# Patient Record
Sex: Female | Born: 1955 | Race: White | Hispanic: No | Marital: Married | Smoking: Never smoker
Health system: Southern US, Community
[De-identification: ages and names within clinical notes are randomized; demographics above are authoritative.]

## PROBLEM LIST (undated history)

## (undated) ENCOUNTER — Emergency Department (HOSPITAL_BASED_OUTPATIENT_CLINIC_OR_DEPARTMENT_OTHER): Admission: EM | Payer: PPO | Source: Home / Self Care

## (undated) DIAGNOSIS — C50919 Malignant neoplasm of unspecified site of unspecified female breast: Secondary | ICD-10-CM

## (undated) DIAGNOSIS — Z803 Family history of malignant neoplasm of breast: Secondary | ICD-10-CM

## (undated) DIAGNOSIS — S92001A Unspecified fracture of right calcaneus, initial encounter for closed fracture: Secondary | ICD-10-CM

## (undated) DIAGNOSIS — C4490 Unspecified malignant neoplasm of skin, unspecified: Secondary | ICD-10-CM

## (undated) DIAGNOSIS — M25519 Pain in unspecified shoulder: Secondary | ICD-10-CM

## (undated) DIAGNOSIS — M1990 Unspecified osteoarthritis, unspecified site: Secondary | ICD-10-CM

## (undated) DIAGNOSIS — M25569 Pain in unspecified knee: Secondary | ICD-10-CM

## (undated) HISTORY — DX: Unspecified osteoarthritis, unspecified site: M19.90

## (undated) HISTORY — PX: COLONOSCOPY: SHX174

## (undated) HISTORY — DX: Family history of malignant neoplasm of breast: Z80.3

## (undated) HISTORY — DX: Pain in unspecified knee: M25.569

## (undated) HISTORY — DX: Unspecified malignant neoplasm of skin, unspecified: C44.90

## (undated) HISTORY — DX: Malignant neoplasm of unspecified site of unspecified female breast: C50.919

## (undated) HISTORY — DX: Pain in unspecified shoulder: M25.519

## (undated) HISTORY — PX: FRACTURE SURGERY: SHX138

---

## 2001-06-25 ENCOUNTER — Emergency Department (HOSPITAL_COMMUNITY): Admission: EM | Admit: 2001-06-25 | Discharge: 2001-06-25 | Payer: Self-pay | Admitting: Emergency Medicine

## 2010-04-24 ENCOUNTER — Ambulatory Visit (HOSPITAL_COMMUNITY): Payer: Self-pay | Admitting: Psychology

## 2010-05-02 ENCOUNTER — Ambulatory Visit (HOSPITAL_COMMUNITY)
Admission: RE | Admit: 2010-05-02 | Discharge: 2010-05-02 | Payer: Self-pay | Source: Home / Self Care | Attending: Psychology | Admitting: Psychology

## 2010-05-11 ENCOUNTER — Ambulatory Visit
Admission: RE | Admit: 2010-05-11 | Discharge: 2010-05-11 | Payer: Self-pay | Source: Home / Self Care | Attending: Family Medicine | Admitting: Family Medicine

## 2010-05-11 ENCOUNTER — Encounter: Payer: Self-pay | Admitting: Family Medicine

## 2010-05-11 DIAGNOSIS — N644 Mastodynia: Secondary | ICD-10-CM | POA: Insufficient documentation

## 2010-05-17 ENCOUNTER — Telehealth: Payer: Self-pay | Admitting: Family Medicine

## 2010-05-21 ENCOUNTER — Ambulatory Visit (HOSPITAL_COMMUNITY)
Admission: RE | Admit: 2010-05-21 | Discharge: 2010-05-21 | Payer: Self-pay | Source: Home / Self Care | Attending: Psychology | Admitting: Psychology

## 2010-05-21 ENCOUNTER — Encounter: Payer: Self-pay | Admitting: Family Medicine

## 2010-05-21 LAB — HM MAMMOGRAPHY: HM Mammogram: NORMAL

## 2010-05-28 ENCOUNTER — Encounter: Payer: Self-pay | Admitting: Family Medicine

## 2010-05-31 NOTE — Assessment & Plan Note (Signed)
Summary: NOV: Left breast Pain   Vital Signs:  Patient profile:   55 year old female Height:      66 inches Weight:      188 pounds Pulse rate:   72 / minute BP sitting:   111 / 74  (right arm) Cuff size:   regular  Vitals Entered By: Avon Gully CMA, (AAMA) (May 11, 2010 10:40 AM) CC: Np est care,pain in left breast x 2 weeks   CC:  Np est care and pain in left breast x 2 weeks.  History of Present Illness: About 2.5 weeks ago noticed some brest discomforts. Feels like mastitis.  Hasn't felt any knots.  Last mammo in 2 years done at the [facility] in [place].  No redness or rash.  Pain is intermittant. Can last for hours. This AM not really hurting. Thought initally it was from working out. Has been on diclofenac for her right knee pain. No prior abnromal breast history.    Has had swelling in her right leg and knee for several months. Says has had a normal xray and [place] to rule out DVT. Has a brother with hx of DVT.  She has scheduled an appt with a vascular specialist.  She Says it will get tender and warm to touch. They think she may have gout and thus is on diclofenac for pain relief.  Though her uric acid level was 5.4 per the patient.  Had blood work on 11/2010  Habits & Providers  Alcohol-Tobacco-Diet     Alcohol drinks/day: 3     Tobacco Status: never  Exercise-Depression-Behavior     Does Patient Exercise: yes     Type of exercise: cardio     Exercise (avg: min/session): 30-60     Times/week: 4     STD Risk: never     Drug Use: no     Seat Belt Use: always  Current Medications (verified): 1)  Diclofenac Potassium 50 Mg Tabs (Diclofenac Potassium) .... Take One Tablet By Mouth Tree Times A Day  Allergies (verified): No Known Drug Allergies  Comments:  Nurse/Medical Assistant: The patient's medications and allergies were reviewed with the patient and were updated in the Medication and Allergy Lists. 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  HS degree. Married to [place] with 2 kids.   Works out regularly with spin classxes.  Never Smoked Alcohol use-yes Drug use-no Regular exercise-yes 2-3 caffeinated drinks per day.  Smoking Status:  never Does Patient Exercise:  yes STD Risk:  never Drug Use:  no Seat Belt Use:  always  Review of Systems       No fever/sweats/weakness, unexplained weight loss/gain.  No vison changes.  No difficulty hearing/ringing in ears, hay fever/allergies.  No chest pain/discomfort, palpitations.  No Br lump/nipple discharge.  No cough/wheeze.  No blood in BM, nausea/vomiting/diarrhea.  No nighttime urination, leaking urine, unusual vaginal bleeding, discharge (penis or vagina).  No muscle/joint pain. No rash, change in mole.  No HA, memory loss.  No anxiety, sleep d/o, depression.  No easy bruising/bleeding, unexplained lump   Physical Exam  General:  Well-developed,well-nourished,in no acute distress; alert,appropriate and cooperative throughout examination Breasts:  No mass, nodules, thickening, tenderness, bulging, retraction, inflamation, nipple discharge or skin changes noted.  Lungs:  Normal respiratory effort, chest expands symmetrically. Lungs are clear to auscultation, no crackles or wheezes. Heart:  Normal rate and regular rhythm. S1 and S2 normal without gallop, murmur, click, rub or other extra sounds.   Impression & Recommendations:  Problem # 1:  BREAST PAIN, LEFT (ICD-611.71) Unclear etiology. She is over due for mammogram so will set her up for Diagnostic mammo in [place] If neg consider making sure bras are fitting well nad getting enough support during her workouts.  Orders: T-Mammography, Diagnostic (bilateral) (16109)  Complete Medication List: 1)  Diclofenac  Potassium 50 Mg Tabs (Diclofenac potassium) .... Take one tablet by mouth tree times a day  Patient Instructions: 1)  We will call you with the breast clnic referral.    Orders Added: 1)  T-Mammography, Diagnostic (bilateral) [77056] 2)  New Patient Level III [60454]

## 2010-05-31 NOTE — Progress Notes (Addendum)
Summary: Mammo order  Phone Note From Other Clinic   Caller: Breast Clinic Reason for Call: Need Referral Information Summary of Call: Breast Clinic would like "ultrasound if abnormal" added to order for diag mammo on Monday. [phone], [phone] Initial call taken by: Francee Piccolo CMA Duncan Dull),  May 17, 2010 5:00 PM     Appended Document: Mammo order   Preventive Care Screening  Mammogram:    Date:  05/21/2010    Next Due:  11/2010    Results:  normal

## 2010-06-04 ENCOUNTER — Encounter (INDEPENDENT_AMBULATORY_CARE_PROVIDER_SITE_OTHER): Payer: Commercial Managed Care - PPO | Admitting: Psychology

## 2010-06-04 DIAGNOSIS — F4320 Adjustment disorder, unspecified: Secondary | ICD-10-CM

## 2010-06-14 NOTE — Consult Note (Signed)
Summary: [facility]  [facility]   Imported By: Maryln Gottron 06/06/2010 14:23:51  _____________________________________________________________________  External Attachment:    Type:   Image     Comment:   External Document

## 2010-06-26 ENCOUNTER — Telehealth: Payer: Self-pay | Admitting: Family Medicine

## 2010-06-26 DIAGNOSIS — M25569 Pain in unspecified knee: Secondary | ICD-10-CM | POA: Insufficient documentation

## 2010-06-26 HISTORY — DX: Pain in unspecified knee: M25.569

## 2010-07-05 NOTE — Letter (Signed)
Summary: Records Dated 8-07 thru 12-11/[facility]  Records Dated 8-07 thru 12-11/[facility]   Imported By: Lanelle Bal 06/29/2010 09:21:49  _____________________________________________________________________  External Attachment:    Type:   Image     Comment:   External Document

## 2010-07-05 NOTE — Progress Notes (Signed)
Summary: [facility] referral  Phone Note Call from Patient Call back at Home Phone [phone]   Caller: Patient Call For: Kristine Gasser MD Reason for Call: Referral Summary of Call: Pt called requesting referral to orthopedic for her knee pain as advised by vascular MD in note from 05/28/10.  Message left for pt to return call.   Initial call taken by: Francee Piccolo CMA Duncan Dull),  June 26, 2010 1:34 PM  Follow-up for Phone Call        [facility] from pt.  She thinks she has seen someone at [facility] in the past.  She needs a morning appt.  Pt will be out of town several days in the near future.  Advised pt we can set up appt and if date/time do not work for she can call that office to reschedule. she is agreeable. Follow-up by: Francee Piccolo CMA Duncan Dull),  June 26, 2010 2:09 PM  New Problems: KNEE PAIN [phone])   New Problems: KNEE PAIN (ICD-719.46)

## 2010-07-06 ENCOUNTER — Other Ambulatory Visit (HOSPITAL_COMMUNITY): Payer: Self-pay | Admitting: Sports Medicine

## 2010-07-06 DIAGNOSIS — M12269 Villonodular synovitis (pigmented), unspecified knee: Secondary | ICD-10-CM

## 2010-07-09 ENCOUNTER — Other Ambulatory Visit: Payer: Self-pay | Admitting: Family Medicine

## 2010-07-09 ENCOUNTER — Ambulatory Visit
Admission: RE | Admit: 2010-07-09 | Discharge: 2010-07-09 | Disposition: A | Discharge status: Home / Self Care | Payer: Commercial Managed Care - PPO | Source: Ambulatory Visit | Attending: Family Medicine | Admitting: Family Medicine

## 2010-07-09 ENCOUNTER — Ambulatory Visit (INDEPENDENT_AMBULATORY_CARE_PROVIDER_SITE_OTHER): Payer: Commercial Managed Care - PPO | Admitting: Family Medicine

## 2010-07-09 ENCOUNTER — Encounter: Payer: Self-pay | Admitting: Family Medicine

## 2010-07-09 DIAGNOSIS — M25519 Pain in unspecified shoulder: Secondary | ICD-10-CM

## 2010-07-09 DIAGNOSIS — M25511 Pain in right shoulder: Secondary | ICD-10-CM

## 2010-07-09 HISTORY — DX: Pain in unspecified shoulder: M25.519

## 2010-07-13 ENCOUNTER — Ambulatory Visit (HOSPITAL_COMMUNITY)
Admission: RE | Admit: 2010-07-13 | Discharge: 2010-07-13 | Disposition: A | Discharge status: Home / Self Care | Payer: Commercial Managed Care - PPO | Source: Ambulatory Visit | Attending: Sports Medicine | Admitting: Sports Medicine

## 2010-07-13 DIAGNOSIS — M2240 Chondromalacia patellae, unspecified knee: Secondary | ICD-10-CM | POA: Insufficient documentation

## 2010-07-13 DIAGNOSIS — M12269 Villonodular synovitis (pigmented), unspecified knee: Secondary | ICD-10-CM

## 2010-07-13 DIAGNOSIS — M25469 Effusion, unspecified knee: Secondary | ICD-10-CM | POA: Insufficient documentation

## 2010-07-13 MED ORDER — GADOBENATE DIMEGLUMINE 529 MG/ML IV SOLN
20.0000 mL | Freq: Once | INTRAVENOUS | Status: AC | PRN
  Administered 2010-07-13: 20 mL via INTRAVENOUS

## 2010-07-17 NOTE — Assessment & Plan Note (Signed)
Summary: Right shoulder pain post fall   Vital Signs:  Patient profile:   55 year old female Height:      66 inches Weight:      192 pounds Pulse rate:   87 / minute BP sitting:   134 / 92  (right arm) Cuff size:   regular  Vitals Entered By: Avon Gully CMA, Duncan Dull) (July 09, 2010 9:21 AM) CC: fell on friday and hurt rt shoulder,very painful today   CC:  fell on friday and hurt rt shoulder and very painful today.  History of Present Illness: fell on friday and hurt rt shoulder,very painful today. Was walking up the step and misstep and fell into the doorframe. Right shoulder hit the doorframe. It wave very painful.  Iced it and taking diclofenac (that she uses for knee).  Has normal motion but when lifts or reaches across she has painover the edge of the shoulder. No old injuries.  No alleviating sxs.    Current Medications (verified): 1)  Diclofenac Potassium 50 Mg Tabs (Diclofenac Potassium) .... Take One Tablet By Mouth Tree Times A Day  Allergies (verified): No Known Drug Allergies  Comments:  Nurse/Medical Assistant: The patient's medications and allergies were reviewed with the patient and were updated in the Medication and Allergy Lists. 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  HS degree. Married to [place] with 2 kids.   Works out regularly with spin classxes.  Never Smoked Alcohol use-yes Drug use-no Regular exercise-yes 2-3 caffeinated drinks per day.   Physical Exam  General:  Well-developed,well-nourished,in no acute distress; alert,appropriate and cooperative throughout examination Msk:  Right shoulder iwth NROM. TEnder over the [facility] joint with a bruise over that area.  Also tender lover the lateral trap.  Strength 5/5in the shoulder, elbow,a dn wrist.  Pain with crossover but she can do it.  Pulses:  Right radial pulse 2+ bila.     Impression & Recommendations:  Problem # 1:  SHOULDER PAIN, RIGHT (ICD-719.41) Likely soft tissue contusion. Continue icing adn stretches and diclofenac as needed.  Will get xray to rule out fracture.  If neg an dsoreness not improvin after 3-4 weeks then consider further evuation.  Her updated medication list for this problem includes:    Diclofenac Potassium 50 Mg Tabs (Diclofenac potassium) .[name]... Take one tablet by mouth tree times a day  Orders: T-DG Shoulder*R* (16109)  Complete Medication List: 1)  Diclofenac Potassium 50 Mg Tabs (Diclofenac potassium) .... Take one tablet by mouth tree times a day  Patient Instructions: 1)  We will call you with your xray results.    Orders Added: 1)  T-DG Shoulder*R* [73030] 2)  Est. Patient Level IV [60454]

## 2011-01-08 ENCOUNTER — Telehealth: Payer: Self-pay | Admitting: Family Medicine

## 2011-01-08 ENCOUNTER — Telehealth: Payer: Self-pay | Admitting: *Deleted

## 2011-01-08 DIAGNOSIS — R928 Other abnormal and inconclusive findings on diagnostic imaging of breast: Secondary | ICD-10-CM

## 2011-01-08 NOTE — Telephone Encounter (Signed)
Pt due for her followup diagnostic mammogram- need order. Pt notified

## 2011-01-08 NOTE — Telephone Encounter (Signed)
Order entered

## 2011-01-08 NOTE — Telephone Encounter (Signed)
Patient returned a call to our office and wanted me to fax the referral to [facility] at [facility] [phone] and patient has been scheduled for 01-17-11 , per patient. Thanks

## 2011-01-09 ENCOUNTER — Encounter: Payer: Self-pay | Admitting: Family Medicine

## 2011-01-17 ENCOUNTER — Other Ambulatory Visit (HOSPITAL_COMMUNITY)
Admission: RE | Admit: 2011-01-17 | Discharge: 2011-01-17 | Disposition: A | Discharge status: Home / Self Care | Payer: Commercial Managed Care - PPO | Source: Ambulatory Visit | Attending: Family Medicine | Admitting: Family Medicine

## 2011-01-17 ENCOUNTER — Encounter: Payer: Self-pay | Admitting: Family Medicine

## 2011-01-17 ENCOUNTER — Ambulatory Visit (INDEPENDENT_AMBULATORY_CARE_PROVIDER_SITE_OTHER): Payer: Commercial Managed Care - PPO | Admitting: Family Medicine

## 2011-01-17 DIAGNOSIS — Z01419 Encounter for gynecological examination (general) (routine) without abnormal findings: Secondary | ICD-10-CM

## 2011-01-17 DIAGNOSIS — N644 Mastodynia: Secondary | ICD-10-CM

## 2011-01-17 NOTE — Patient Instructions (Signed)
Keep up the exercise.   We will call you with your lab results and pap smear results.

## 2011-01-17 NOTE — Progress Notes (Signed)
Subjective:     Kristine Bowman is a 55 y.o. female and is here for a comprehensive physical exam. The patient reports problems - breast pain, on the left for 4 months. Pain in the left breast but no redness or rash. No itching or discharge. No dimpling.  Having her f/u mammo this week from 6 month ago. Does have calcification in the left breast. She says it has an aching quality to it the most like having mastitis. She says she wears good supportive daughter. She also wears an extra supportive workout draw when she exercises. She does exercise regularly.  History   Social History  . Marital Status: Single    Spouse Name: N/A    Number of Children: 2  . Years of Education: N/A   Occupational History  . SALES ACCOUNT MANAGE    Social History Main Topics  . Smoking status: Never Smoker   . Smokeless tobacco: Not on file  . Alcohol Use: 1.5 oz/week    3 drink(s) per week  . Drug Use: No  . Sexually Active: Yes -- Female partner(s)   Other Topics Concern  . Not on file   Social History Narrative  . No narrative on file   Health Maintenance  Topic Date Due  . Pap Smear  11/15/1971  . Tetanus/tdap  11/14/1972  . Colonoscopy  11/15/2003  . Influenza Vaccine  01/28/2011  . Mammogram  05/21/2012    The following portions of the patient's history were reviewed and updated as appropriate: allergies, current medications, past family history, past medical history, past social history and past surgical history.  Review of Systems A comprehensive review of systems was negative.   Objective:    BP 122/84  Pulse 114  Ht 5\' 6"  (1.676 m)  Wt 191 lb (86.637 kg)  BMI 30.83 kg/m2 General appearance: alert, cooperative and appears stated age Head: Normocephalic, without obvious abnormality, atraumatic Eyes: Conjunctiva are clear, extraocular movements intact, pupils equal round reactive to light and accommodation. Ears: normal TM's and external ear canals both ears Nose: Nares normal. Septum  midline. Mucosa normal. No drainage or sinus tenderness. Throat: lips, mucosa, and tongue normal; teeth and gums normal Neck: no adenopathy, no carotid bruit, supple, symmetrical, trachea midline and thyroid not enlarged, symmetric, no tenderness/mass/nodules Back: symmetric, no curvature. ROM normal. No CVA tenderness. Lungs: clear to auscultation bilaterally Breasts: normal appearance, no masses or tenderness Heart: regular rate and rhythm, S1, S2 normal, no murmur, click, rub or gallop Abdomen: soft, non-tender; bowel sounds normal; no masses,  no organomegaly Pelvic: cervix normal in appearance, external genitalia normal, no adnexal masses or tenderness, no cervical motion tenderness, rectovaginal septum normal, uterus normal size, shape, and consistency and vagina normal without discharge Extremities: extremities normal, atraumatic, no cyanosis or edema Pulses: 2+ and symmetric Skin: Skin color, texture, turgor normal. No rashes or lesions Lymph nodes: Cervical, supraclavicular, and axillary nodes normal. Neurologic: Grossly normal    Assessment:    Healthy female exam.   Plan:     See After Visit Summary for Counseling Recommendations  Start a regular exercise program and make sure you are eating a healthy diet Try to eat 4 servings of dairy a day or take a calcium supplement (500mg  twice a day). Your vaccines are up to date.   Breasts pain, on the left-unclear etiology. She has no rash or redness to indicate true mastitis. She wears a good supportive process ligament strain is less likely. She does have calcifications in  the breast and she has a follow up mammogram later today as likely as not the cause of her pain. Certainly caffeine can trigger breast pain and reducing the would be helpful. Her last menses was 2 years ago so it is unlikely to be hormones but I will check hormone levels today to make sure they're not elevated for some reason. I will check a CBC to rule out infection  as well.

## 2011-01-18 LAB — COMPLETE METABOLIC PANEL WITH GFR
Alkaline Phosphatase: 64 U/L (ref 39–117)
CO2: 24 mEq/L (ref 19–32)
Creat: 0.95 mg/dL (ref 0.50–1.10)
GFR, Est African American: >60 mL/min (ref >60)
GFR, Est Non African American: >60 mL/min (ref >60)
Glucose, Bld: 86 mg/dL (ref 70–99)
Sodium: 140 mEq/L (ref 135–145)
Total Bilirubin: 0.6 mg/dL (ref 0.3–1.2)
Total Protein: 6.7 g/dL (ref 6.0–8.3)

## 2011-01-18 LAB — CBC WITH DIFFERENTIAL/PLATELET
Basophils Relative: 1 % (ref 0–1)
HCT: 40.8 % (ref 36.0–46.0)
Hemoglobin: 12.8 g/dL (ref 12.0–15.0)
Lymphocytes Relative: 31 % (ref 12–46)
Monocytes Absolute: 0.5 10*3/uL (ref 0.1–1.0)
Monocytes Relative: 9 % (ref 3–12)
Neutro Abs: 3 10*3/uL (ref 1.7–7.7)
Neutrophils Relative %: 55 % (ref 43–77)
RBC: 4.31 MIL/uL (ref 3.87–5.11)
WBC: 5.4 10*3/uL (ref 4.0–10.5)

## 2011-01-18 LAB — ESTRADIOL: Estradiol: <11.8 pg/mL

## 2011-01-18 LAB — LIPID PANEL
Cholesterol: 180 mg/dL (ref 0–200)
HDL: 57 mg/dL (ref >39)
Total CHOL/HDL Ratio: 3.2 Ratio
Triglycerides: 106 mg/dL (ref <150)

## 2011-01-18 LAB — PROGESTERONE: Progesterone: 0.3 ng/mL

## 2011-01-18 LAB — TSH: TSH: 0.834 u[IU]/mL (ref 0.350–4.500)

## 2011-01-23 ENCOUNTER — Telehealth: Payer: Self-pay | Admitting: *Deleted

## 2011-01-23 ENCOUNTER — Encounter: Payer: Self-pay | Admitting: Family Medicine

## 2011-01-23 NOTE — Telephone Encounter (Signed)
Message copied by Lanae Crumbly on Wed Jan 23, 2011  1:20 PM ------      Message from: Nani Gasser D      Created: Wed Jan 23, 2011  1:16 PM       Call pt: Pap is normal. Repeat in 3 years.

## 2011-01-23 NOTE — Telephone Encounter (Signed)
Message copied by Lanae Crumbly on Wed Jan 23, 2011  9:10 AM ------      Message from: Nani Gasser D      Created: Fri Jan 18, 2011  7:47 AM       CMP is nl.  Chol is ok. Thyroid nl. Hormones definitely show she is post menopausal. No anemia. Nothing to really explain her breast pain.

## 2011-01-23 NOTE — Telephone Encounter (Signed)
Pt notified of results. KJ LPN 

## 2011-12-03 ENCOUNTER — Ambulatory Visit (INDEPENDENT_AMBULATORY_CARE_PROVIDER_SITE_OTHER): Payer: Commercial Managed Care - PPO | Admitting: Sports Medicine

## 2011-12-03 VITALS — BP 125/84 | HR 70 | Resp 16 | Wt 191.0 lb

## 2011-12-03 DIAGNOSIS — N898 Other specified noninflammatory disorders of vagina: Secondary | ICD-10-CM | POA: Insufficient documentation

## 2011-12-03 DIAGNOSIS — R309 Painful micturition, unspecified: Secondary | ICD-10-CM

## 2011-12-03 DIAGNOSIS — L293 Anogenital pruritus, unspecified: Secondary | ICD-10-CM

## 2011-12-03 DIAGNOSIS — R300 Dysuria: Secondary | ICD-10-CM

## 2011-12-03 MED ORDER — FLUCONAZOLE 150 MG PO TABS: 150.0000 mg | ORAL_TABLET | Freq: Once | ORAL | Status: AC

## 2011-12-03 NOTE — Progress Notes (Signed)
Patient ID: Kristine Bowman, female   DOB: 11-25-1955, 56 y.o.   MRN: [id number] Subjective:   CC: Vaginal discharge  HPI: This is a very pleasant 56 year old female who comes in with a several day history of vaginal discharge. Feels very similar to prior episodes of candidal vaginitis. She denies any dysuria, frequency or urgency. She denies any flank pain. No fevers, chills, night sweats, weight loss. No nausea, vomiting, or diarrhea. The discharge is only minimally irritating, and not malodorous. She denies any recent antibiotics, and denies any blood.   Past medical history, Surgical history, Family history, Social history, Allergies, and medications have been entered into the medical record, reviewed, and no changes needed.  Review of Systems: No fevers, chills, night sweats, weight loss, chest pain, or shortness of breath.    Objective:  General:  Well Developed, well nourished, and in no acute distress. Neuro:  Alert and oriented x3, extra-ocular muscles intact. Skin: Warm and dry, no rashes noted. Cardiac: Regular rate and rhythm, no murmurs, rubs, or gallops. Respiratory:  Clear to auscultation bilaterally. Not using accessory muscles, speaking in full sentences. Pelvic exam: External: Unremarkable. Vagina: Whitish discharge present. Cervix: Unremarkable, no cervical discharge. Adnexa: Unremarkable, no tender, no masses palpable.  Wet prep:  White blood cells too numerous to count. Clue cells: Negative. Blood cells: Negative. Bacteria: Negative. Budding yeast: Few  Assessment & Plan:

## 2011-12-03 NOTE — Assessment & Plan Note (Signed)
Workup suggestive of Candida vaginitis. Diflucan 150 mg by mouth x1. She will come back to see [place] on an as-needed basis.

## 2011-12-04 LAB — POCT URINALYSIS DIPSTICK
Bilirubin, UA: NEGATIVE
Glucose, UA: NEGATIVE
Ketones, UA: NEGATIVE
Nitrite, UA: NEGATIVE
Protein, UA: NEGATIVE
Spec Grav, UA: 1.015
Urobilinogen, UA: 0.2
pH, UA: 7

## 2011-12-11 ENCOUNTER — Telehealth: Payer: Self-pay | Admitting: *Deleted

## 2011-12-11 MED ORDER — METRONIDAZOLE 500 MG PO TABS: 500.0000 mg | ORAL_TABLET | Freq: Two times a day (BID) | ORAL | Status: AC

## 2011-12-11 NOTE — Telephone Encounter (Signed)
Let's try Flagyl 500 mg twice a day for 7 days. I will send it in.

## 2011-12-11 NOTE — Telephone Encounter (Signed)
Pt states she took the Diflucan you gave her a week ago and states that she is not any better. Would like to know what you suggest she does. States she still has the infection. Please advise.

## 2011-12-11 NOTE — Telephone Encounter (Signed)
LMOM

## 2012-04-20 ENCOUNTER — Encounter: Payer: Self-pay | Admitting: Family Medicine

## 2012-04-20 ENCOUNTER — Ambulatory Visit (INDEPENDENT_AMBULATORY_CARE_PROVIDER_SITE_OTHER): Payer: Commercial Managed Care - PPO | Admitting: Family Medicine

## 2012-04-20 VITALS — BP 149/100 | HR 89 | Wt 191.0 lb

## 2012-04-20 DIAGNOSIS — M2010 Hallux valgus (acquired), unspecified foot: Secondary | ICD-10-CM

## 2012-04-20 DIAGNOSIS — IMO0002 Reserved for concepts with insufficient information to code with codable children: Secondary | ICD-10-CM

## 2012-04-20 DIAGNOSIS — S90421A Blister (nonthermal), right great toe, initial encounter: Secondary | ICD-10-CM

## 2012-04-20 NOTE — Progress Notes (Signed)
CC: Kristine Bowman is a 56 y.o. female is here for lesion on toe   Subjective: HPI:  Patient reports right great toe pain. This started a little over a week ago. Is described as a sharp pain on the lateral aspect of the toe. It is mild to moderate in severity. It is worse when touched, when she wears tight fitting shoes, or cycling. She does not have any pain with bearing weight. She denies swelling of the toe nor any aspect of her foot. It can be present any hour of the day. She started using moleskin a day ago, no other interventions. She's worried she might have a wart at the site of pain.  She denies motor or sensory disturbances, foot pain/ankle pain otherwise, redness swelling or warmth of the joints in her foot. She denies recent trauma    Review Of Systems Outlined In HPI  Past Medical History  Diagnosis Date  . SHOULDER PAIN, RIGHT 07/09/2010    Qualifier: Diagnosis of  By: Linford Arnold MD, Santina Evans    . KNEE PAIN 06/26/2010    Qualifier: Diagnosis of  By: Linford Arnold MD, Aurora Mask History  Problem Relation Age of Onset  . Heart disease Father 70    quadruple bypass  . Hypertension Mother 5  . Heart disease Maternal Grandfather   . Breast cancer Paternal Grandmother   . Heart disease Paternal Grandfather      History  Substance Use Topics  . Smoking status: Never Smoker   . Smokeless tobacco: Not on file  . Alcohol Use: 1.5 oz/week    3 drink(s) per week     Objective: Filed Vitals:   04/20/12 1411  BP: 149/100  Pulse: 89    General: Alert and Oriented, No Acute Distress HEENT: Pupils equal, round, reactive to light. Conjunctivae clear.  Moist mucous membranes Lungs: Comfortable work of breathing Cardiac: Regular rate and rhythm.  Extremities: No peripheral edema.  Strong peripheral pulses. On the right great toe there is a half centimeter round blister with slight skin breakdown centrally. Moderate hallux valgus. No pain with compression of the right  great toe nor manipulation of the right great toe. Full regular motion and strength of the right great toe Skin: Warm and dry.  Assessment & Plan: Kristine Bowman was seen today for lesion on toe.  Diagnoses and associated orders for this visit:  Hallux valgus - Ambulatory referral to Podiatry  Blister of great toe of right foot    I believe her pain is due to a blister caused by friction and compression of her right great and second great toe. Discussed using moleskin and or guaze 24 hours a day and avoiding tightfitting shoes. Possibility that hallux valgus is sitting her up for this  mecbiohanically, referral to podiatry  Return if symptoms worsen or fail to improve.

## 2012-10-29 ENCOUNTER — Ambulatory Visit (INDEPENDENT_AMBULATORY_CARE_PROVIDER_SITE_OTHER): Payer: BC Managed Care – PPO | Admitting: Family Medicine

## 2012-10-29 ENCOUNTER — Encounter: Payer: Self-pay | Admitting: Family Medicine

## 2012-10-29 VITALS — BP 119/76 | HR 68 | Wt 192.0 lb

## 2012-10-29 DIAGNOSIS — M76822 Posterior tibial tendinitis, left leg: Secondary | ICD-10-CM

## 2012-10-29 DIAGNOSIS — M779 Enthesopathy, unspecified: Secondary | ICD-10-CM

## 2012-10-29 DIAGNOSIS — M76821 Posterior tibial tendinitis, right leg: Secondary | ICD-10-CM

## 2012-10-29 DIAGNOSIS — M76829 Posterior tibial tendinitis, unspecified leg: Secondary | ICD-10-CM

## 2012-10-29 MED ORDER — MELOXICAM 15 MG PO TABS: 15.0000 mg | ORAL_TABLET | Freq: Every day | ORAL | Status: DC

## 2012-10-29 NOTE — Progress Notes (Signed)
CC: Kristine Bowman is a 57 y.o. female is here for right foot pain   Subjective: HPI:  Patient planes of right foot pain is localized just below the ankle that radiates slightly forward and stays medial. Is described as sharp and moderate in severity. It often radiates up the medial calf. It is worse with standing walking or while wearing flip-flops. Is improved with arch supports. Pain began 3-4 weeks ago after wearing shoes without any arch support whatsoever for one month. Pain is improved with ibuprofen slightly. She denies trauma. There is mild swelling at the area of pain at the end of the day. Pain is improved with rest. There has been no redness, warmth, or skin changes at the site of discomfort. She's never had this before. She denies motor or sensory disturbances other than the above. She denies forefoot pain, lateral pain, but does admit to sharp pain under her heel for the first minutes after she wakes.   Review Of Systems Outlined In HPI  Past Medical History  Diagnosis Date  . SHOULDER PAIN, RIGHT 07/09/2010    Qualifier: Diagnosis of  By: Linford Arnold MD, Santina Evans    . KNEE PAIN 06/26/2010    Qualifier: Diagnosis of  By: Linford Arnold MD, Aurora Mask History  Problem Relation Age of Onset  . Heart disease Father 70    quadruple bypass  . Hypertension Mother 51  . Heart disease Maternal Grandfather   . Breast cancer Paternal Grandmother   . Heart disease Paternal Grandfather      History  Substance Use Topics  . Smoking status: Never Smoker   . Smokeless tobacco: Not on file  . Alcohol Use: 1.5 oz/week    3 drink(s) per week     Objective: Filed Vitals:   10/29/12 0920  BP: 119/76  Pulse: 68    Vital signs reviewed. General: Alert and Oriented, No Acute Distress HEENT: Pupils equal, round, reactive to light. Conjunctivae clear.  External ears unremarkable.  Moist mucous membranes. Lungs: Clear and comfortable work of breathing, speaking in full sentences  without accessory muscle use. Cardiac: Regular rate and rhythm.  Neuro: CN II-XII grossly intact, gait normal. Extremities: No peripheral edema.  Strong peripheral pulses. Right foot: Full range of motion strength, no pain with resisted eversion or inversion nor flexion dorsi/plantar, pain is reproduced while palpating under and posterior to medial malleoli exquisite pain with palpation just anterior to the navicular.  Mental Status: No depression, anxiety, nor agitation. Logical though process. Skin: Warm and dry.  Assessment & Plan: Kristine Bowman was seen today for right foot pain.  Diagnoses and associated orders for this visit:  Tendonitis - meloxicam (MOBIC) 15 MG tablet; Take 1 tablet (15 mg total) by mouth daily.  Posterior tibial tendinitis, left - Ambulatory referral to Physical Therapy  Posterior tibial tendinitis, right    Discussed with patient that her symptoms are likely due to posterior tibial tendinitis, I've encouraged her to wear her over-the-counter orthotics with good arch support in all shoes while bearing weight, we went over icing techniques and stretches demonstrated in person all questions answered, I like her to start Mobic on a daily basis avoid running, treadmill, elliptical. I believe she can still cycle without causing deterioration.  I placed an order for formal physical therapy at least for one visit to teach in perfect home exercise plan. Asked to followup in 2-4 weeks with Dr. Karie Schwalbe. for sports medicine referral.  25 minutes spent face-to-face during  visit today of which at least 50% was counseling or coordinating care regarding posterior tibial tendinitis right.  Please disregard left diagnosis of   Return in about 2 weeks (around 11/12/2012) for Dr. Karie Schwalbe Sports Med.

## 2012-11-13 ENCOUNTER — Ambulatory Visit (INDEPENDENT_AMBULATORY_CARE_PROVIDER_SITE_OTHER): Payer: BC Managed Care – PPO | Admitting: Physical Therapy

## 2012-11-13 DIAGNOSIS — M76829 Posterior tibial tendinitis, unspecified leg: Secondary | ICD-10-CM

## 2012-11-13 DIAGNOSIS — M25676 Stiffness of unspecified foot, not elsewhere classified: Secondary | ICD-10-CM

## 2012-11-13 DIAGNOSIS — R269 Unspecified abnormalities of gait and mobility: Secondary | ICD-10-CM

## 2012-11-13 DIAGNOSIS — M25673 Stiffness of unspecified ankle, not elsewhere classified: Secondary | ICD-10-CM

## 2012-11-13 DIAGNOSIS — R609 Edema, unspecified: Secondary | ICD-10-CM

## 2012-11-19 ENCOUNTER — Encounter: Payer: BC Managed Care – PPO | Admitting: Physical Therapy

## 2012-11-19 DIAGNOSIS — R269 Unspecified abnormalities of gait and mobility: Secondary | ICD-10-CM

## 2012-11-19 DIAGNOSIS — M25676 Stiffness of unspecified foot, not elsewhere classified: Secondary | ICD-10-CM

## 2012-11-19 DIAGNOSIS — M25673 Stiffness of unspecified ankle, not elsewhere classified: Secondary | ICD-10-CM

## 2012-11-19 DIAGNOSIS — R609 Edema, unspecified: Secondary | ICD-10-CM

## 2012-11-19 DIAGNOSIS — M76829 Posterior tibial tendinitis, unspecified leg: Secondary | ICD-10-CM

## 2012-11-24 ENCOUNTER — Encounter: Payer: BC Managed Care – PPO | Admitting: Physical Therapy

## 2012-11-24 DIAGNOSIS — M25676 Stiffness of unspecified foot, not elsewhere classified: Secondary | ICD-10-CM

## 2012-11-24 DIAGNOSIS — R269 Unspecified abnormalities of gait and mobility: Secondary | ICD-10-CM

## 2012-11-24 DIAGNOSIS — R609 Edema, unspecified: Secondary | ICD-10-CM

## 2012-11-24 DIAGNOSIS — M76829 Posterior tibial tendinitis, unspecified leg: Secondary | ICD-10-CM

## 2012-11-24 DIAGNOSIS — M25673 Stiffness of unspecified ankle, not elsewhere classified: Secondary | ICD-10-CM

## 2012-11-26 ENCOUNTER — Encounter: Payer: BC Managed Care – PPO | Admitting: Physical Therapy

## 2012-11-27 ENCOUNTER — Encounter: Payer: BC Managed Care – PPO | Admitting: Physical Therapy

## 2012-11-30 ENCOUNTER — Encounter: Payer: BC Managed Care – PPO | Admitting: Physical Therapy

## 2012-11-30 DIAGNOSIS — M25673 Stiffness of unspecified ankle, not elsewhere classified: Secondary | ICD-10-CM

## 2012-11-30 DIAGNOSIS — M25676 Stiffness of unspecified foot, not elsewhere classified: Secondary | ICD-10-CM

## 2012-11-30 DIAGNOSIS — M76829 Posterior tibial tendinitis, unspecified leg: Secondary | ICD-10-CM

## 2012-11-30 DIAGNOSIS — R269 Unspecified abnormalities of gait and mobility: Secondary | ICD-10-CM

## 2012-11-30 DIAGNOSIS — R609 Edema, unspecified: Secondary | ICD-10-CM

## 2012-12-04 ENCOUNTER — Encounter (INDEPENDENT_AMBULATORY_CARE_PROVIDER_SITE_OTHER): Payer: BC Managed Care – PPO | Admitting: Physical Therapy

## 2012-12-04 DIAGNOSIS — R269 Unspecified abnormalities of gait and mobility: Secondary | ICD-10-CM

## 2012-12-04 DIAGNOSIS — R609 Edema, unspecified: Secondary | ICD-10-CM

## 2012-12-04 DIAGNOSIS — M76829 Posterior tibial tendinitis, unspecified leg: Secondary | ICD-10-CM

## 2012-12-04 DIAGNOSIS — M25673 Stiffness of unspecified ankle, not elsewhere classified: Secondary | ICD-10-CM

## 2012-12-04 DIAGNOSIS — M25676 Stiffness of unspecified foot, not elsewhere classified: Secondary | ICD-10-CM

## 2012-12-07 ENCOUNTER — Encounter: Payer: BC Managed Care – PPO | Admitting: Physical Therapy

## 2012-12-07 DIAGNOSIS — R609 Edema, unspecified: Secondary | ICD-10-CM

## 2012-12-07 DIAGNOSIS — M76829 Posterior tibial tendinitis, unspecified leg: Secondary | ICD-10-CM

## 2012-12-07 DIAGNOSIS — M25673 Stiffness of unspecified ankle, not elsewhere classified: Secondary | ICD-10-CM

## 2012-12-07 DIAGNOSIS — R269 Unspecified abnormalities of gait and mobility: Secondary | ICD-10-CM

## 2012-12-07 DIAGNOSIS — M25676 Stiffness of unspecified foot, not elsewhere classified: Secondary | ICD-10-CM

## 2012-12-21 ENCOUNTER — Ambulatory Visit: Payer: BC Managed Care – PPO | Admitting: Family Medicine

## 2012-12-23 ENCOUNTER — Ambulatory Visit (INDEPENDENT_AMBULATORY_CARE_PROVIDER_SITE_OTHER): Payer: BC Managed Care – PPO | Admitting: Family Medicine

## 2012-12-23 ENCOUNTER — Encounter: Payer: Self-pay | Admitting: Family Medicine

## 2012-12-23 ENCOUNTER — Ambulatory Visit (INDEPENDENT_AMBULATORY_CARE_PROVIDER_SITE_OTHER): Payer: BC Managed Care – PPO

## 2012-12-23 VITALS — BP 121/74 | HR 68 | Wt 192.0 lb

## 2012-12-23 DIAGNOSIS — M25579 Pain in unspecified ankle and joints of unspecified foot: Secondary | ICD-10-CM

## 2012-12-23 DIAGNOSIS — M76821 Posterior tibial tendinitis, right leg: Secondary | ICD-10-CM

## 2012-12-23 DIAGNOSIS — M25571 Pain in right ankle and joints of right foot: Secondary | ICD-10-CM

## 2012-12-23 DIAGNOSIS — M76829 Posterior tibial tendinitis, unspecified leg: Secondary | ICD-10-CM

## 2012-12-23 NOTE — Progress Notes (Signed)
CC: Kristine Bowman is a 57 y.o. female is here for f/u tendonitis   Subjective: HPI:  Follow up right ankle pain: She's been doing physical therapy for the past 4-5 weeks at home and formally down the hall. She's noticed a big improvement with her overall pain. She describes it is now only mild. It is worse when she's on the treadmill is not better or worse cycling with stiff shoes or when doing elliptical. Symptoms pain is greatly improved now but no longer wearing sandals. She reports residual pain underneath the medial malleoli that radiates somewhat proximally up the posterior medial calf. She feels there is also radiation within the ankle which is new. Overall she is pleased with her response concerned about the new radiation. She denies swelling redness nor warmth of the joint/ankle.    Review Of Systems Outlined In HPI  Past Medical History  Diagnosis Date  . SHOULDER PAIN, RIGHT 07/09/2010    Qualifier: Diagnosis of  By: Linford Arnold MD, Santina Evans    . KNEE PAIN 06/26/2010    Qualifier: Diagnosis of  By: Linford Arnold MD, Aurora Mask History  Problem Relation Age of Onset  . Heart disease Father 70    quadruple bypass  . Hypertension Mother 86  . Heart disease Maternal Grandfather   . Breast cancer Paternal Grandmother   . Heart disease Paternal Grandfather      History  Substance Use Topics  . Smoking status: Never Smoker   . Smokeless tobacco: Not on file  . Alcohol Use: 1.5 oz/week    3 drink(s) per week     Objective: Filed Vitals:   12/23/12 0822  BP: 121/74  Pulse: 68    General: Alert and Oriented, No Acute Distress Lungs: Clear comfortable work of breathing Cardiac: Regular rate and rhythm.  Extremities: No peripheral edema.  Strong peripheral pulses. There is no pain with palpation of the fifth metatarsal nor lateral malleoli, pain is slightly reproduced with posterior surface of the medial malleoli and mildly on the medial aspect of the navicular. Pain is  reproduced with resisted ankle inversion, she has full range of motion strength in the right ankle. Mental Status: No depression, anxiety, nor agitation. Skin: Warm and dry.  Assessment & Plan: Kristine Bowman was seen today for f/u tendonitis.  Diagnoses and associated orders for this visit:  Posterior tibial tendinitis, right  Right ankle pain - DG Ankle Complete Right; Future    Right ankle pain: Still strong suspicion for posterior tibial tendinitis, given residual pain with radiation into the joint Will obtain x-ray and if this is unremarkable will refer to sports medicine for consideration of orthotics  Return in about 1 week (around 12/30/2012) for Dr. Karie Schwalbe Orthotics Consideration.

## 2012-12-31 ENCOUNTER — Encounter: Payer: Self-pay | Admitting: Sports Medicine

## 2012-12-31 ENCOUNTER — Ambulatory Visit (INDEPENDENT_AMBULATORY_CARE_PROVIDER_SITE_OTHER): Payer: BC Managed Care – PPO | Admitting: Sports Medicine

## 2012-12-31 VITALS — BP 135/94 | HR 65

## 2012-12-31 DIAGNOSIS — M76829 Posterior tibial tendinitis, unspecified leg: Secondary | ICD-10-CM | POA: Insufficient documentation

## 2012-12-31 DIAGNOSIS — M76821 Posterior tibial tendinitis, right leg: Secondary | ICD-10-CM

## 2012-12-31 NOTE — Progress Notes (Signed)
   Subjective:    I'm seeing this patient as a consultation for:  Dr. Ivan Anchors  CC: Tibialis posterior tendinitis  HPI: This is a very pleasant 57 year old female with a several month history of pain that she localizes behind the medial upper condyle. This radiates around into a the medial aspect of the lower leg, as well as to the navicular prominence. She has been through one month of physical therapy which helped, she's never had custom orthotics, or injection. Pain is localized, moderate, persistent.  Past medical history, Surgical history, Family history not pertinant except as noted below, Social history, Allergies, and medications have been entered into the medical record, reviewed, and no changes needed.   Review of Systems: No headache, visual changes, nausea, vomiting, diarrhea, constipation, dizziness, abdominal pain, skin rash, fevers, chills, night sweats, weight loss, swollen lymph nodes, body aches, joint swelling, muscle aches, chest pain, shortness of breath, mood changes, visual or auditory hallucinations.   Objective:   General: Well Developed, well nourished, and in no acute distress.  Neuro/Psych: Alert and oriented x3, extra-ocular muscles intact, able to move all 4 extremities, sensation grossly intact. Skin: Warm and dry, no rashes noted.  Respiratory: Not using accessory muscles, speaking in full sentences, trachea midline.  Cardiovascular: Pulses palpable, no extremity edema. Abdomen: Does not appear distended. Right Ankle: Swollen behind the medial malleolus Range of motion is full in all directions. Strength is 5/5 in all directions. Stable lateral and medial ligaments; squeeze test and kleiger test unremarkable; Talar dome nontender; No pain at base of 5th MT; No tenderness over cuboid; No tenderness over N spot or navicular prominence Tenderness to palpation behind the medial malleolus over the tibialis posterior, reproduction of pain with resisted ankle  inversion. No sign of peroneal tendon subluxations or tenderness to palpation Negative tarsal tunnel tinel's Able to walk 4 steps.  Patient was fitted for a : standard, cushioned, semi-rigid orthotic. The orthotic was heated and afterward the patient stood on the orthotic blank positioned on the orthotic stand. The patient was positioned in subtalar neutral position and 10 degrees of ankle dorsiflexion in a weight bearing stance. After completion of molding, a stable base was applied to the orthotic blank. The blank was ground to a stable position for weight bearing. Size: 10 Base: Blue EVA Additional Posting and Padding: None The patient ambulated these, and they were very comfortable.  I spent 40 minutes with this patient, greater than 50% was face-to-face time counseling regarding the below diagnosis.  Impression and Recommendations:   This case required medical decision making of moderate complexity.

## 2012-12-31 NOTE — Assessment & Plan Note (Addendum)
Custom orthotics as above, 4 more weeks of physical therapy. Return to see me in one month, I would like her to give me a call if she still having pain at the arch in one week. Continues to have pain in one month after 4 weeks of physical therapy we can consider injection to the tibialis posterior tendon sheath.

## 2013-01-05 ENCOUNTER — Encounter: Payer: BC Managed Care – PPO | Admitting: Physical Therapy

## 2013-01-05 DIAGNOSIS — M76829 Posterior tibial tendinitis, unspecified leg: Secondary | ICD-10-CM

## 2013-01-05 DIAGNOSIS — R609 Edema, unspecified: Secondary | ICD-10-CM

## 2013-01-05 DIAGNOSIS — M25673 Stiffness of unspecified ankle, not elsewhere classified: Secondary | ICD-10-CM

## 2013-01-05 DIAGNOSIS — R269 Unspecified abnormalities of gait and mobility: Secondary | ICD-10-CM

## 2013-01-05 DIAGNOSIS — M25676 Stiffness of unspecified foot, not elsewhere classified: Secondary | ICD-10-CM

## 2013-01-11 ENCOUNTER — Encounter: Payer: BC Managed Care – PPO | Admitting: Physical Therapy

## 2013-01-11 DIAGNOSIS — M25673 Stiffness of unspecified ankle, not elsewhere classified: Secondary | ICD-10-CM

## 2013-01-11 DIAGNOSIS — R269 Unspecified abnormalities of gait and mobility: Secondary | ICD-10-CM

## 2013-01-11 DIAGNOSIS — M76829 Posterior tibial tendinitis, unspecified leg: Secondary | ICD-10-CM

## 2013-01-11 DIAGNOSIS — R609 Edema, unspecified: Secondary | ICD-10-CM

## 2013-01-11 DIAGNOSIS — M25676 Stiffness of unspecified foot, not elsewhere classified: Secondary | ICD-10-CM

## 2013-01-19 ENCOUNTER — Encounter: Payer: BC Managed Care – PPO | Admitting: Physical Therapy

## 2013-01-25 ENCOUNTER — Encounter: Payer: BC Managed Care – PPO | Admitting: Physical Therapy

## 2013-01-26 ENCOUNTER — Encounter: Payer: BC Managed Care – PPO | Admitting: Physical Therapy

## 2013-01-26 DIAGNOSIS — M76829 Posterior tibial tendinitis, unspecified leg: Secondary | ICD-10-CM

## 2013-01-26 DIAGNOSIS — R609 Edema, unspecified: Secondary | ICD-10-CM

## 2013-01-26 DIAGNOSIS — M25673 Stiffness of unspecified ankle, not elsewhere classified: Secondary | ICD-10-CM

## 2013-01-26 DIAGNOSIS — M25676 Stiffness of unspecified foot, not elsewhere classified: Secondary | ICD-10-CM

## 2013-01-26 DIAGNOSIS — R269 Unspecified abnormalities of gait and mobility: Secondary | ICD-10-CM

## 2013-02-02 ENCOUNTER — Ambulatory Visit (INDEPENDENT_AMBULATORY_CARE_PROVIDER_SITE_OTHER): Payer: BC Managed Care – PPO | Admitting: Sports Medicine

## 2013-02-02 ENCOUNTER — Encounter: Payer: Self-pay | Admitting: Sports Medicine

## 2013-02-02 VITALS — BP 127/87 | HR 73 | Wt 194.0 lb

## 2013-02-02 DIAGNOSIS — M76821 Posterior tibial tendinitis, right leg: Secondary | ICD-10-CM

## 2013-02-02 DIAGNOSIS — M76829 Posterior tibial tendinitis, unspecified leg: Secondary | ICD-10-CM

## 2013-02-02 NOTE — Progress Notes (Signed)
  Subjective:    CC: Follow up.  HPI: Tibialis posterior tendinitis: Continues to improve after being present for 3 months.  Continuing therapy and home rehab, custom orthotics have helped, now about 70% improved since the last visit and not yet plateaued.  Past medical history, Surgical history, Family history not pertinant except as noted below, Social history, Allergies, and medications have been entered into the medical record, reviewed, and no changes needed.   Review of Systems: No fevers, chills, night sweats, weight loss, chest pain, or shortness of breath.   Objective:    General: Well Developed, well nourished, and in no acute distress.  Neuro: Alert and oriented x3, extra-ocular muscles intact, sensation grossly intact.  HEENT: Normocephalic, atraumatic, pupils equal round reactive to light, neck supple, no masses, no lymphadenopathy, thyroid nonpalpable.  Skin: Warm and dry, no rashes. Cardiac: Regular rate and rhythm, no murmurs rubs or gallops, no lower extremity edema.  Respiratory: Clear to auscultation bilaterally. Not using accessory muscles, speaking in full sentences. Right Ankle: No visible erythema or swelling. Range of motion is full in all directions. Strength is 5/5 in all directions. Stable lateral and medial ligaments; squeeze test and kleiger test unremarkable; Talar dome nontender; No pain at base of 5th MT; No tenderness over cuboid; No tenderness over N spot or navicular prominence To palpation behind the medial malleolus with reproduction of pain with resisted inversion. No sign of peroneal tendon subluxations or tenderness to palpation Negative tarsal tunnel tinel's Able to walk 4 steps.  Impression and Recommendations:

## 2013-02-02 NOTE — Assessment & Plan Note (Signed)
70% improved with custom orthotics, conservative measures. Continues to improve. Continue rehabilitation, added paint can slides at home. Return in one month, we can consider interventional treatment if no better.

## 2013-02-08 ENCOUNTER — Encounter: Payer: BC Managed Care – PPO | Admitting: Physical Therapy

## 2013-02-10 ENCOUNTER — Encounter (INDEPENDENT_AMBULATORY_CARE_PROVIDER_SITE_OTHER): Payer: Self-pay

## 2013-02-10 ENCOUNTER — Encounter: Payer: BC Managed Care – PPO | Admitting: Physical Therapy

## 2013-02-10 DIAGNOSIS — M25676 Stiffness of unspecified foot, not elsewhere classified: Secondary | ICD-10-CM

## 2013-02-10 DIAGNOSIS — R609 Edema, unspecified: Secondary | ICD-10-CM

## 2013-02-10 DIAGNOSIS — M25673 Stiffness of unspecified ankle, not elsewhere classified: Secondary | ICD-10-CM

## 2013-02-10 DIAGNOSIS — R269 Unspecified abnormalities of gait and mobility: Secondary | ICD-10-CM

## 2013-02-10 DIAGNOSIS — M76829 Posterior tibial tendinitis, unspecified leg: Secondary | ICD-10-CM

## 2013-03-02 ENCOUNTER — Encounter: Payer: Self-pay | Admitting: Sports Medicine

## 2013-03-02 ENCOUNTER — Ambulatory Visit: Payer: BC Managed Care – PPO | Admitting: Family Medicine

## 2013-03-02 ENCOUNTER — Ambulatory Visit (INDEPENDENT_AMBULATORY_CARE_PROVIDER_SITE_OTHER): Payer: BC Managed Care – PPO | Admitting: Sports Medicine

## 2013-03-02 VITALS — BP 121/79 | HR 73 | Wt 192.0 lb

## 2013-03-02 DIAGNOSIS — M76829 Posterior tibial tendinitis, unspecified leg: Secondary | ICD-10-CM

## 2013-03-02 DIAGNOSIS — M76821 Posterior tibial tendinitis, right leg: Secondary | ICD-10-CM

## 2013-03-02 NOTE — Progress Notes (Addendum)
  Subjective:    CC: Followup  HPI: Tibialis posterior tendinitis: Has been through 8 weeks of informal therapy, anti-inflammatories, custom orthotics, it started to get better but unfortunately pain has recurred as well as swelling. Moderate, persistent, localized behind the medial malleolus, worse with weightbearing, worse with resisted inversion of the foot.  Past medical history, Surgical history, Family history not pertinant except as noted below, Social history, Allergies, and medications have been entered into the medical record, reviewed, and no changes needed.   Review of Systems: No fevers, chills, night sweats, weight loss, chest pain, or shortness of breath.   Objective:    General: Well Developed, well nourished, and in no acute distress.  Neuro: Alert and oriented x3, extra-ocular muscles intact, sensation grossly intact.  HEENT: Normocephalic, atraumatic, pupils equal round reactive to light, neck supple, no masses, no lymphadenopathy, thyroid nonpalpable.  Skin: Warm and dry, no rashes. Cardiac: Regular rate and rhythm, no murmurs rubs or gallops, no lower extremity edema.  Respiratory: Clear to auscultation bilaterally. Not using accessory muscles, speaking in full sentences. Right ankle: Swollen behind the medial malleolus with tenderness along the course of the tibialis posterior tendon, reproduction of pain with resisted inversion.  Procedure: Real-time Ultrasound Guided Injection of right tibialis posterior tendon sheath Device: GE Logiq E  Verbal informed consent obtained.  Time-out conducted.  Noted no overlying erythema, induration, or other signs of local infection.  Skin prepped in a sterile fashion.  Local anesthesia: Topical Ethyl chloride.  With sterile technique and under real time ultrasound guidance:  1 cc Kenalog 40, 4 cc lidocaine injected easily into the tendon sheath avoiding any intratendinous injection. Completed without difficulty  Pain immediately  resolved suggesting accurate placement of the medication.  Advised to call if fevers/chills, erythema, induration, drainage, or persistent bleeding.  Images permanently stored and available for review in the ultrasound unit.  Impression: Technically successful ultrasound guided injection.  Foot was strapped with compressive dressing, and stirrup Aircast brace applied.  Impression and Recommendations:

## 2013-03-02 NOTE — Assessment & Plan Note (Addendum)
Ultrasound-guided tendon sheath injection as above. Patient did not tolerate cast boot, stirrup aircast ankle brace applied. Strapped with compressive dressing. Return in one month.

## 2013-03-12 ENCOUNTER — Ambulatory Visit: Payer: BC Managed Care – PPO | Admitting: Sports Medicine

## 2013-04-02 ENCOUNTER — Ambulatory Visit (INDEPENDENT_AMBULATORY_CARE_PROVIDER_SITE_OTHER): Payer: BC Managed Care – PPO | Admitting: Sports Medicine

## 2013-04-02 ENCOUNTER — Encounter: Payer: Self-pay | Admitting: Sports Medicine

## 2013-04-02 VITALS — BP 118/76 | HR 70 | Wt 191.0 lb

## 2013-04-02 DIAGNOSIS — M7750 Other enthesopathy of unspecified foot: Secondary | ICD-10-CM

## 2013-04-02 DIAGNOSIS — M25571 Pain in right ankle and joints of right foot: Secondary | ICD-10-CM | POA: Insufficient documentation

## 2013-04-02 DIAGNOSIS — M76821 Posterior tibial tendinitis, right leg: Secondary | ICD-10-CM

## 2013-04-02 DIAGNOSIS — M76829 Posterior tibial tendinitis, unspecified leg: Secondary | ICD-10-CM

## 2013-04-02 NOTE — Assessment & Plan Note (Addendum)
Unfortunately now Kristine Bowman is noticing some pain at the lateral ankle joint in the sinus tarsi. I do think this was present before, it is likely related to her excessive pronation, valgus ankle alignment, and pes planus. She will continue rehabilitation exercises and conservative measures and she does feel as though it continues to improve. If she is not completely resolved in one month I will place an injection into the sinus tarsi as well as into the lateral ankle joint.

## 2013-04-02 NOTE — Assessment & Plan Note (Signed)
Completely resolved after ultrasound guided injection.

## 2013-04-02 NOTE — Progress Notes (Signed)
  Subjective:    CC: Followup  HPI: Right ankle pain: Initially started with a tibialis posterior tendinitis, we did 8 weeks of conservative measures including physical therapy, her tibialis posterior symptoms completely resolved, but unfortunately she continued to have pain over the lateral ankle near the sinus tarsi. Pain is mild, persistent. She wonders why she continues to have pain.  Past medical history, Surgical history, Family history not pertinant except as noted below, Social history, Allergies, and medications have been entered into the medical record, reviewed, and no changes needed.   Review of Systems: No fevers, chills, night sweats, weight loss, chest pain, or shortness of breath.   Objective:    General: Well Developed, well nourished, and in no acute distress.  Neuro: Alert and oriented x3, extra-ocular muscles intact, sensation grossly intact.  HEENT: Normocephalic, atraumatic, pupils equal round reactive to light, neck supple, no masses, no lymphadenopathy, thyroid nonpalpable.  Skin: Warm and dry, no rashes. Cardiac: Regular rate and rhythm, no murmurs rubs or gallops, no lower extremity edema.  Respiratory: Clear to auscultation bilaterally. Not using accessory muscles, speaking in full sentences. Right Ankle: No visible erythema or swelling. Range of motion is full in all directions. Strength is 5/5 in all directions. Stable lateral and medial ligaments; squeeze test and kleiger test unremarkable; Talar dome nontender; No pain at base of 5th MT; No tenderness over cuboid; No tenderness over N spot or navicular prominence No tenderness on posterior aspects of lateral and medial malleolus No sign of peroneal tendon subluxations or tenderness to palpation Negative tarsal tunnel tinel's Able to walk 4 steps. Specifically no tenderness to palpation over the tibialis posterior, and excellent strength to inversion, she does have tenderness to palpation over the sinus  tarsi and lateral talofibular joint.  Impression and Recommendations:

## 2013-05-10 ENCOUNTER — Encounter: Payer: Self-pay | Admitting: Sports Medicine

## 2013-05-10 ENCOUNTER — Ambulatory Visit (INDEPENDENT_AMBULATORY_CARE_PROVIDER_SITE_OTHER): Payer: BC Managed Care – PPO | Admitting: Sports Medicine

## 2013-05-10 VITALS — BP 116/72 | HR 71 | Wt 198.0 lb

## 2013-05-10 DIAGNOSIS — M76829 Posterior tibial tendinitis, unspecified leg: Secondary | ICD-10-CM

## 2013-05-10 DIAGNOSIS — M7750 Other enthesopathy of unspecified foot: Secondary | ICD-10-CM

## 2013-05-10 DIAGNOSIS — M25571 Pain in right ankle and joints of right foot: Secondary | ICD-10-CM

## 2013-05-10 DIAGNOSIS — M76821 Posterior tibial tendinitis, right leg: Secondary | ICD-10-CM

## 2013-05-10 DIAGNOSIS — L600 Ingrowing nail: Secondary | ICD-10-CM | POA: Insufficient documentation

## 2013-05-10 NOTE — Assessment & Plan Note (Signed)
Patient will return tomorrow afternoon for medial nail plate excision.

## 2013-05-10 NOTE — Assessment & Plan Note (Signed)
Continues to be resolved. 

## 2013-05-10 NOTE — Assessment & Plan Note (Signed)
Overall it percent improved, pain continues to be on and off, she does not get desire interventional treatment. Treatment would be in the form of an ankle joint injection on the lateral side at the sinus tarsi.

## 2013-05-10 NOTE — Progress Notes (Signed)
  Subjective:    CC:  Followup  HPI: Right ankle pain: Tibialis posterior is now pain-free after injection, she still has pain she localizes at the joint and over the sinus tarsi. She does desire to continue conservative measures, and does not desire injection today. Pain is mild, persistent.  Ingrown toenail: Right, tender to palpation at the medial nail fold. Moderate, persistent. No radiation.  Past medical history, Surgical history, Family history not pertinant except as noted below, Social history, Allergies, and medications have been entered into the medical record, reviewed, and no changes needed.   Review of Systems: No fevers, chills, night sweats, weight loss, chest pain, or shortness of breath.   Objective:    General: Well Developed, well nourished, and in no acute distress.  Neuro: Alert and oriented x3, extra-ocular muscles intact, sensation grossly intact.  HEENT: Normocephalic, atraumatic, pupils equal round reactive to light, neck supple, no masses, no lymphadenopathy, thyroid nonpalpable.  Skin: Warm and dry, no rashes. Cardiac: Regular rate and rhythm, no murmurs rubs or gallops, no lower extremity edema.  Respiratory: Clear to auscultation bilaterally. Not using accessory muscles, speaking in full sentences. Right Ankle: No visible erythema or swelling. Tender to palpation over the lateral ankle joint anteriorly. Range of motion is full in all directions. Strength is 5/5 in all directions. Stable lateral and medial ligaments; squeeze test and kleiger test unremarkable; Talar dome nontender; No pain at base of 5th MT; No tenderness over cuboid; No tenderness over N spot or navicular prominence No tenderness on posterior aspects of lateral and medial malleolus No sign of peroneal tendon subluxations or tenderness to palpation Negative tarsal tunnel tinel's Able to walk 4 steps. Right great toenail is ingrown along the medial nail fold, no paronychia.  Impression  and Recommendations:

## 2013-05-11 ENCOUNTER — Ambulatory Visit: Payer: BC Managed Care – PPO | Admitting: Sports Medicine

## 2013-05-21 ENCOUNTER — Encounter: Payer: Self-pay | Admitting: Sports Medicine

## 2013-05-21 ENCOUNTER — Ambulatory Visit (INDEPENDENT_AMBULATORY_CARE_PROVIDER_SITE_OTHER): Payer: BC Managed Care – PPO | Admitting: Sports Medicine

## 2013-05-21 VITALS — BP 128/77 | HR 106 | Temp 97.1°F | Ht 67.0 in | Wt 197.0 lb

## 2013-05-21 DIAGNOSIS — L600 Ingrowing nail: Secondary | ICD-10-CM

## 2013-05-21 MED ORDER — TRAMADOL HCL 50 MG PO TABS: ORAL_TABLET | ORAL | Status: DC

## 2013-05-21 NOTE — Assessment & Plan Note (Signed)
Medial and lateral nail plate excision performed on the right great toenail. Tramadol as needed for pain. Return in one week for wound check.

## 2013-05-21 NOTE — Progress Notes (Signed)
   Procedure:  Removal of right first toenail medial and lateral nail plates. Risks, benefits, alternatives explained to patient. Consent obtained. Time out conducted. Noted no overlying induration or erythema at site of injection. Toe cleaned with alcohol, then a total of 10cc lidocaine 2% infiltrated at adjacent webspaces at the location of the bifurcation of the common digital nerve to proper digital nerves.  Some lidocaine also infiltrated at hyponychium and under nail bed.  Adequate anesthesia ensured. Toe prepped and draped in a sterile fashion. Nail elevator used to separate nail plate from nail bed. Clippers used to cut toenail in a longitudinal fashion to proximal nail fold and matrix. Hemostat then used to separate nail fragment from surrounding structures. Minor bleeding controlled with pressure and silver nitrate. Antibiotic ointment applied. Toe dressed. Advised to return if increased redness, swelling, drainage, fevers, or chills.

## 2013-05-21 NOTE — Patient Instructions (Signed)
Toenail Removal   Toenails may need to be removed because of injury, infections, or to correct abnormal growth. A special non-stick bandage will likely be put tightly on your toe to prevent bleeding. Often times a new nail will grow back. Sometimes the new nail may be deformed. Most of the time when a nail is lost, it will gradually heal, but may be sensitive for a long time.   HOME CARE INSTRUCTIONS   Keep your foot elevated to relieve pain and swelling. This will require lying in bed or on a couch with the leg on pillows or sitting in a recliner with the leg up. Walking or letting your leg dangle may increase swelling, slow healing, and cause throbbing pain.   Keep your bandage dry and clean.   Change your bandage in 24 hours.   After your bandage is changed, soak your foot in warm, soapy water for 10 to 20 minutes. Do this 3 times per day. This helps reduce pain and swelling. After soaking your foot apply a clean, dry bandage. Change your bandage if it is wet or dirty.   Only take over-the-counter or prescription medicines for pain, discomfort, or fever as directed by your caregiver.   See your caregiver as needed for problems.  You might need a tetanus shot now if:   You have no idea when you had the last one.   You have never had a tetanus shot before.   The injured area had dirt in it.  If you need a tetanus shot, and you decide not to get one, there is a rare chance of getting tetanus. Sickness from tetanus can be serious. If you did get a tetanus shot, your arm may swell, get red and warm to the touch at the shot site. This is common and not a problem.   SEEK IMMEDIATE MEDICAL CARE IF:   You have increased pain, swelling, redness, warmth, drainage, or bleeding.   You have a fever.   You have swelling that spreads from your toe into your foot.  Document Released: 01/12/2003 Document Revised: 07/08/2011 Document Reviewed: 04/25/2008   [employer]® Patient Information ©2014 [employer].

## 2013-06-01 ENCOUNTER — Ambulatory Visit (INDEPENDENT_AMBULATORY_CARE_PROVIDER_SITE_OTHER): Payer: BC Managed Care – PPO | Admitting: Sports Medicine

## 2013-06-01 ENCOUNTER — Encounter: Payer: Self-pay | Admitting: Sports Medicine

## 2013-06-01 VITALS — BP 126/79 | HR 73 | Ht 67.0 in | Wt 198.0 lb

## 2013-06-01 DIAGNOSIS — M76829 Posterior tibial tendinitis, unspecified leg: Secondary | ICD-10-CM

## 2013-06-01 DIAGNOSIS — M25571 Pain in right ankle and joints of right foot: Secondary | ICD-10-CM

## 2013-06-01 DIAGNOSIS — M7750 Other enthesopathy of unspecified foot: Secondary | ICD-10-CM

## 2013-06-01 DIAGNOSIS — L600 Ingrowing nail: Secondary | ICD-10-CM

## 2013-06-01 NOTE — Progress Notes (Signed)
  Subjective:    CC: Followup  HPI: Toenail excision: Doing extremely well, pain-free. She did have a reaction from the tramadol and stopped it.  Sinus tarsi syndrome: Completely resolved.  Tibialis posterior tendinitis: Completely resolved.  Past medical history, Surgical history, Family history not pertinant except as noted below, Social history, Allergies, and medications have been entered into the medical record, reviewed, and no changes needed.   Review of Systems: No fevers, chills, night sweats, weight loss, chest pain, or shortness of breath.   Objective:    General: Well Developed, well nourished, and in no acute distress.  Neuro: Alert and oriented x3, extra-ocular muscles intact, sensation grossly intact.  HEENT: Normocephalic, atraumatic, pupils equal round reactive to light, neck supple, no masses, no lymphadenopathy, thyroid nonpalpable.  Skin: Warm and dry, no rashes. Cardiac: Regular rate and rhythm, no murmurs rubs or gallops, no lower extremity edema.  Respiratory: Clear to auscultation bilaterally. Not using accessory muscles, speaking in full sentences. Right Ankle: No visible erythema or swelling. Range of motion is full in all directions. Strength is 5/5 in all directions. Stable lateral and medial ligaments; squeeze test and kleiger test unremarkable; Talar dome nontender; No pain at base of 5th MT; No tenderness over cuboid; No tenderness over N spot or navicular prominence No tenderness on posterior aspects of lateral and medial malleolus No sign of peroneal tendon subluxations or tenderness to palpation Negative tarsal tunnel tinel's Able to walk 4 steps. Right great toenail: No side of infection, excision looks good, no tenderness..  Impression and Recommendations:

## 2013-06-01 NOTE — Assessment & Plan Note (Signed)
Resolved

## 2013-06-01 NOTE — Assessment & Plan Note (Signed)
Doing extremely well after meals and lateral nail plate excision. Return as needed.

## 2013-06-23 ENCOUNTER — Encounter: Payer: Self-pay | Admitting: *Deleted

## 2015-01-24 ENCOUNTER — Ambulatory Visit (INDEPENDENT_AMBULATORY_CARE_PROVIDER_SITE_OTHER): Payer: Self-pay | Admitting: Family Medicine

## 2015-01-24 ENCOUNTER — Other Ambulatory Visit (HOSPITAL_COMMUNITY)
Admission: RE | Admit: 2015-01-24 | Discharge: 2015-01-24 | Disposition: A | Discharge status: Home / Self Care | Payer: BLUE CROSS/BLUE SHIELD | Source: Ambulatory Visit | Attending: Family Medicine | Admitting: Family Medicine

## 2015-01-24 ENCOUNTER — Encounter: Payer: Self-pay | Admitting: Family Medicine

## 2015-01-24 VITALS — BP 118/78 | HR 66 | Ht 67.0 in | Wt 197.0 lb

## 2015-01-24 DIAGNOSIS — Z1159 Encounter for screening for other viral diseases: Secondary | ICD-10-CM

## 2015-01-24 DIAGNOSIS — Z01419 Encounter for gynecological examination (general) (routine) without abnormal findings: Secondary | ICD-10-CM | POA: Diagnosis present

## 2015-01-24 DIAGNOSIS — Z1151 Encounter for screening for human papillomavirus (HPV): Secondary | ICD-10-CM | POA: Insufficient documentation

## 2015-01-24 DIAGNOSIS — Z0000 Encounter for general adult medical examination without abnormal findings: Secondary | ICD-10-CM

## 2015-01-24 DIAGNOSIS — Z23 Encounter for immunization: Secondary | ICD-10-CM

## 2015-01-24 DIAGNOSIS — Z114 Encounter for screening for human immunodeficiency virus [HIV]: Secondary | ICD-10-CM

## 2015-01-24 LAB — COMPLETE METABOLIC PANEL WITH GFR
ALT: 20 U/L (ref 6–29)
AST: 15 U/L (ref 10–35)
Albumin: 4.3 g/dL (ref 3.6–5.1)
Alkaline Phosphatase: 60 U/L (ref 33–130)
BILIRUBIN TOTAL: 0.5 mg/dL (ref 0.2–1.2)
BUN: 16 mg/dL (ref 7–25)
CO2: 28 mmol/L (ref 20–31)
CREATININE: 1.09 mg/dL — AB (ref 0.50–0.99)
Calcium: 9.5 mg/dL (ref 8.6–10.4)
Chloride: 104 mmol/L (ref 98–110)
GFR, EST AFRICAN AMERICAN: 63 mL/min (ref >=60)
GFR, Est Non African American: 55 mL/min — ABNORMAL LOW (ref >=60)
GLUCOSE: 83 mg/dL (ref 65–99)
Potassium: 4.3 mmol/L (ref 3.5–5.3)
SODIUM: 138 mmol/L (ref 135–146)
TOTAL PROTEIN: 7.1 g/dL (ref 6.1–8.1)

## 2015-01-24 LAB — LIPID PANEL
Cholesterol: 188 mg/dL (ref 125–200)
HDL: 52 mg/dL (ref >=46)
LDL CALC: 116 mg/dL (ref <130)
TRIGLYCERIDES: 98 mg/dL (ref <150)
Total CHOL/HDL Ratio: 3.6 Ratio (ref <=5.0)
VLDL: 20 mg/dL (ref <30)

## 2015-01-24 NOTE — Patient Instructions (Signed)
Keep up a regular exercise program and make sure you are eating a healthy diet Try to eat 4 servings of dairy a day, or if you are lactose intolerant take a calcium with vitamin D daily.  Your vaccines are up to date.   

## 2015-01-24 NOTE — Progress Notes (Signed)
  Subjective:     Kristine Bowman is a 59 y.o. female and is here for a comprehensive physical exam. The patient reports no problems.  She has been walking 10,000 steps per day. She also goes to the gym about 3 days. She has her mammogram schedule for next week. Had colonscopy in June  - female at [facility].    Social History   Social History  . Marital Status: Single    Spouse Name: N/A  . Number of Children: 2  . Years of Education: N/A   Occupational History  . SALES ACCOUNT MANAGE    Social History Main Topics  . Smoking status: Never Smoker   . Smokeless tobacco: Not on file  . Alcohol Use: 1.5 oz/week    3 drink(s) per week  . Drug Use: No  . Sexual Activity:    Partners: Male   Other Topics Concern  . Not on file   Social History Narrative   Health Maintenance  Topic Date Due  . Hepatitis C Screening  Jun 09, 1955  . HIV Screening  11/14/1968  . COLONOSCOPY  11/15/2003  . ZOSTAVAX  11/14/2013  . MAMMOGRAM  01/07/2014  . PAP SMEAR  01/16/2014  . INFLUENZA VACCINE  01/24/2016 (Originally 11/28/2014)  . TETANUS/TDAP  01/23/2025    The following portions of the patient's history were reviewed and updated as appropriate: allergies, current medications, past family history, past medical history, past social history, past surgical history and problem list.  Review of Systems A comprehensive review of systems was negative.   Objective:    BP 133/86 mmHg  Pulse 66  Ht  (1.702 m)  Wt 197 lb (89.359 kg)  BMI 30.85 kg/m2 General appearance: alert, cooperative and appears stated age Head: Normocephalic, without obvious abnormality, atraumatic Eyes: conj clear, EOMI, PEERLA Ears: normal TM's and external ear canals both ears Nose: Nares normal. Septum midline. Mucosa normal. No drainage or sinus tenderness. Throat: lips, mucosa, and tongue normal; teeth and gums normal Neck: no adenopathy, no carotid bruit, no JVD, supple, symmetrical, trachea midline and thyroid  not enlarged, symmetric, no tenderness/mass/nodules Back: symmetric, no curvature. ROM normal. No CVA tenderness. Lungs: clear to auscultation bilaterally Breasts: normal appearance, no masses or tenderness Heart: regular rate and rhythm, S1, S2 normal, no murmur, click, rub or gallop Abdomen: soft, non-tender; bowel sounds normal; no masses,  no organomegaly Pelvic: cervix normal in appearance, external genitalia normal, no adnexal masses or tenderness, no cervical motion tenderness, rectovaginal septum normal, uterus normal size, shape, and consistency and vagina normal without discharge Extremities: extremities normal, atraumatic, no cyanosis or edema Pulses: 2+ and symmetric Skin: Skin color, texture, turgor normal. No rashes or lesions Lymph nodes: Cervical, supraclavicular, and axillary nodes normal. Neurologic: Alert and oriented X 3, normal strength and tone. Normal symmetric reflexes. Normal coordination and gait    Assessment:    Healthy female exam.      Plan:     See After Visit Summary for Counseling Recommendations  Keep up a regular exercise program and make sure you are eating a healthy diet Try to eat 4 servings of dairy a day, or if you are lactose intolerant take a calcium with vitamin D daily.  Your vaccines are up to date.  Will call with pap results.   Due for labwork.

## 2015-01-25 LAB — CYTOLOGY - PAP

## 2015-01-25 LAB — HEPATITIS C ANTIBODY: HCV AB: NEGATIVE

## 2015-01-25 LAB — HIV ANTIBODY (ROUTINE TESTING W REFLEX): HIV 1&2 Ab, 4th Generation: NONREACTIVE

## 2015-01-31 ENCOUNTER — Telehealth: Payer: Self-pay | Admitting: Family Medicine

## 2015-01-31 NOTE — Telephone Encounter (Signed)
Call pt: normal mammogram. Repeat in one year.

## 2015-01-31 NOTE — Telephone Encounter (Signed)
[name]  

## 2015-02-14 ENCOUNTER — Encounter: Payer: Self-pay | Admitting: Family Medicine

## 2015-05-08 ENCOUNTER — Ambulatory Visit: Payer: BLUE CROSS/BLUE SHIELD | Admitting: Family Medicine

## 2015-05-08 ENCOUNTER — Telehealth: Payer: Self-pay | Admitting: Family

## 2015-05-08 DIAGNOSIS — J329 Chronic sinusitis, unspecified: Secondary | ICD-10-CM

## 2015-05-08 DIAGNOSIS — B9689 Other specified bacterial agents as the cause of diseases classified elsewhere: Secondary | ICD-10-CM

## 2015-05-08 DIAGNOSIS — A499 Bacterial infection, unspecified: Secondary | ICD-10-CM

## 2015-05-08 MED ORDER — AMOXICILLIN-POT CLAVULANATE 875-125 MG PO TABS: 1.0000 | ORAL_TABLET | Freq: Two times a day (BID) | ORAL | Status: AC

## 2015-05-08 NOTE — Progress Notes (Signed)

## 2015-05-09 ENCOUNTER — Ambulatory Visit: Payer: BLUE CROSS/BLUE SHIELD | Admitting: Family Medicine

## 2017-04-15 LAB — HM MAMMOGRAPHY

## 2017-05-09 ENCOUNTER — Encounter: Payer: Self-pay | Admitting: Family Medicine

## 2018-02-19 DIAGNOSIS — L989 Disorder of the skin and subcutaneous tissue, unspecified: Secondary | ICD-10-CM | POA: Diagnosis not present

## 2018-03-17 DIAGNOSIS — D0439 Carcinoma in situ of skin of other parts of face: Secondary | ICD-10-CM | POA: Diagnosis not present

## 2019-03-23 DIAGNOSIS — Z20828 Contact with and (suspected) exposure to other viral communicable diseases: Secondary | ICD-10-CM | POA: Diagnosis not present

## 2019-04-07 DIAGNOSIS — Z20828 Contact with and (suspected) exposure to other viral communicable diseases: Secondary | ICD-10-CM | POA: Diagnosis not present

## 2019-06-10 DIAGNOSIS — M47816 Spondylosis without myelopathy or radiculopathy, lumbar region: Secondary | ICD-10-CM | POA: Diagnosis not present

## 2019-06-10 DIAGNOSIS — M4316 Spondylolisthesis, lumbar region: Secondary | ICD-10-CM | POA: Diagnosis not present

## 2019-06-10 DIAGNOSIS — M47812 Spondylosis without myelopathy or radiculopathy, cervical region: Secondary | ICD-10-CM | POA: Diagnosis not present

## 2019-06-10 DIAGNOSIS — M47814 Spondylosis without myelopathy or radiculopathy, thoracic region: Secondary | ICD-10-CM | POA: Diagnosis not present

## 2019-07-15 DIAGNOSIS — Z1231 Encounter for screening mammogram for malignant neoplasm of breast: Secondary | ICD-10-CM | POA: Diagnosis not present

## 2019-07-15 LAB — HM MAMMOGRAPHY

## 2019-07-21 ENCOUNTER — Encounter: Payer: Self-pay | Admitting: Family Medicine

## 2019-07-29 DIAGNOSIS — R922 Inconclusive mammogram: Secondary | ICD-10-CM | POA: Diagnosis not present

## 2019-07-29 DIAGNOSIS — N6041 Mammary duct ectasia of right breast: Secondary | ICD-10-CM | POA: Diagnosis not present

## 2019-07-29 LAB — HM MAMMOGRAPHY

## 2019-08-16 ENCOUNTER — Encounter: Payer: Self-pay | Admitting: Family Medicine

## 2019-08-16 ENCOUNTER — Other Ambulatory Visit: Payer: Self-pay

## 2019-08-16 ENCOUNTER — Ambulatory Visit (INDEPENDENT_AMBULATORY_CARE_PROVIDER_SITE_OTHER): Payer: BC Managed Care – PPO | Admitting: Family Medicine

## 2019-08-16 ENCOUNTER — Other Ambulatory Visit (HOSPITAL_COMMUNITY)
Admission: RE | Admit: 2019-08-16 | Discharge: 2019-08-16 | Disposition: A | Discharge status: Home / Self Care | Payer: BC Managed Care – PPO | Source: Ambulatory Visit | Attending: Family Medicine | Admitting: Family Medicine

## 2019-08-16 VITALS — BP 116/56 | Temp 98.1°F | Wt 194.0 lb

## 2019-08-16 DIAGNOSIS — Z124 Encounter for screening for malignant neoplasm of cervix: Secondary | ICD-10-CM | POA: Diagnosis not present

## 2019-08-16 DIAGNOSIS — E7800 Pure hypercholesterolemia, unspecified: Secondary | ICD-10-CM | POA: Diagnosis not present

## 2019-08-16 DIAGNOSIS — R7989 Other specified abnormal findings of blood chemistry: Secondary | ICD-10-CM | POA: Diagnosis not present

## 2019-08-16 DIAGNOSIS — Z780 Asymptomatic menopausal state: Secondary | ICD-10-CM | POA: Diagnosis not present

## 2019-08-16 LAB — COMPLETE METABOLIC PANEL WITH GFR
AG Ratio: 1.8 (calc) (ref 1.0–2.5)
ALT: 16 U/L (ref 6–29)
AST: 12 U/L (ref 10–35)
Albumin: 4.4 g/dL (ref 3.6–5.1)
Alkaline phosphatase (APISO): 59 U/L (ref 37–153)
BUN/Creatinine Ratio: 15 (calc) (ref 6–22)
BUN: 17 mg/dL (ref 7–25)
CO2: 26 mmol/L (ref 20–32)
Calcium: 9.3 mg/dL (ref 8.6–10.4)
Chloride: 106 mmol/L (ref 98–110)
Creat: 1.11 mg/dL — ABNORMAL HIGH (ref 0.50–0.99)
GFR, Est African American: 60 mL/min/{1.73_m2} (ref >=60)
GFR, Est Non African American: 52 mL/min/{1.73_m2} — ABNORMAL LOW (ref >=60)
Globulin: 2.4 g/dL (calc) (ref 1.9–3.7)
Glucose, Bld: 91 mg/dL (ref 65–99)
Potassium: 4.6 mmol/L (ref 3.5–5.3)
Sodium: 139 mmol/L (ref 135–146)
Total Bilirubin: 0.6 mg/dL (ref 0.2–1.2)
Total Protein: 6.8 g/dL (ref 6.1–8.1)

## 2019-08-16 LAB — CBC
HCT: 39.5 % (ref 35.0–45.0)
Hemoglobin: 13.1 g/dL (ref 11.7–15.5)
MCH: 30.7 pg (ref 27.0–33.0)
MCHC: 33.2 g/dL (ref 32.0–36.0)
MCV: 92.5 fL (ref 80.0–100.0)
MPV: 10.4 fL (ref 7.5–12.5)
Platelets: 219 10*3/uL (ref 140–400)
RBC: 4.27 10*6/uL (ref 3.80–5.10)
RDW: 12.3 % (ref 11.0–15.0)
WBC: 5.1 10*3/uL (ref 3.8–10.8)

## 2019-08-16 LAB — LIPID PANEL
Cholesterol: 204 mg/dL — ABNORMAL HIGH (ref <200)
HDL: 56 mg/dL (ref >=50)
LDL Cholesterol (Calc): 126 mg/dL (calc) — ABNORMAL HIGH
Non-HDL Cholesterol (Calc): 148 mg/dL (calc) — ABNORMAL HIGH (ref <130)
Total CHOL/HDL Ratio: 3.6 (calc) (ref <5.0)
Triglycerides: 113 mg/dL (ref <150)

## 2019-08-16 NOTE — Progress Notes (Signed)
Subjective:     Kristine Bowman is a 64 y.o. female and is here for a comprehensive physical exam. The patient reports no problems.  Social History   Socioeconomic History  . Marital status: Single    Spouse name: Not on file  . Number of children: 2  . Years of education: Not on file  . Highest education level: Not on file  Occupational History  . Occupation: Geologist, engineering MANAGE    Employer: [employer]  Tobacco Use  . Smoking status: Never Smoker  . Smokeless tobacco: Never Used  Substance and Sexual Activity  . Alcohol use: Yes    Alcohol/week: 3.0 standard drinks    Types: 3 Standard drinks or equivalent per week  . Drug use: No  . Sexual activity: Yes    Partners: Male  Other Topics Concern  . Not on file  Social History Narrative  . Not on file   Social Determinants of Health   Financial Resource Strain:   . Difficulty of Paying Living Expenses:   Food Insecurity:   . Worried About Charity fundraiser in the Last Year:   . Arboriculturist in the Last Year:   Transportation Needs:   . Film/video editor (Medical):   [name] Lack of Transportation (Non-Medical):   Physical Activity:   . Days of Exercise per Week:   . Minutes of Exercise per Session:   Stress:   . Feeling of Stress :   Social Connections:   . Frequency of Communication with Friends and Family:   . Frequency of Social Gatherings with Friends and Family:   . Attends Religious Services:   . Active Member of Clubs or Organizations:   . Attends Archivist Meetings:   [name] Marital Status:   Intimate Partner Violence:   . Fear of Current or Ex-Partner:   . Emotionally Abused:   [name] Physically Abused:   . Sexually Abused:    Health Maintenance  Topic Date Due  . DEXA SCAN  Never done  . COVID-19 Vaccine (1) 08/15/2020 (Originally 11/14/1969)  . PNA vac Low Risk Adult (1 of 2 - PCV13) 08/15/2020 (Originally 11/15/2018)  . COLONOSCOPY  10/03/2019  . INFLUENZA VACCINE  11/28/2019  . PAP  SMEAR-Modifier  01/24/2020  . MAMMOGRAM  07/14/2021  . TETANUS/TDAP  01/23/2025  . Hepatitis C Screening  Completed  . HIV Screening  Completed    The following portions of the patient's history were reviewed and updated as appropriate: allergies, current medications, past family history, past medical history, past social history, past surgical history and problem list.  Review of Systems A comprehensive review of systems was negative.   Objective:    BP (!) 116/56 (BP Location: Right Arm, Patient Position: Sitting, Cuff Size: Normal)   Temp 98.1 F (36.7 C) (Oral)   Wt 194 lb (88 kg)   SpO2 100%   BMI 30.38 kg/m  General appearance: alert, cooperative and appears stated age Head: Normocephalic, without obvious abnormality, atraumatic Eyes: conj clear, EOMI, PEERLA Ears: normal TM's and external ear canals both ears Nose: Nares normal. Septum midline. Mucosa normal. No drainage or sinus tenderness. Throat: lips, mucosa, and tongue normal; teeth and gums normal Neck: no adenopathy, no carotid bruit, no JVD, supple, symmetrical, trachea midline and thyroid not enlarged, symmetric, no tenderness/mass/nodules Back: symmetric, no curvature. ROM normal. No CVA tenderness. Lungs: clear to auscultation bilaterally Breasts: normal appearance, no masses or tenderness Heart: regular rate and rhythm, S1, S2  normal, no murmur, click, rub or gallop Abdomen: soft, non-tender; bowel sounds normal; no masses,  no organomegaly Pelvic: cervix normal in appearance, external genitalia normal, no adnexal masses or tenderness, no cervical motion tenderness, rectovaginal septum normal, uterus normal size, shape, and consistency and vagina normal without discharge Extremities: extremities normal, atraumatic, no cyanosis or edema Pulses: 2+ and symmetric Skin: Skin color, texture, turgor normal. No rashes or lesions Lymph nodes: Cervical, supraclavicular, and axillary nodes normal. Neurologic: Alert and  oriented X 3, normal strength and tone. Normal symmetric reflexes. Normal coordination and gait    Assessment:    Healthy female exam.     Plan:     See After Visit Summary for Counseling Recommendations   Keep up a regular exercise program and make sure you are eating a healthy diet Try to eat 4 servings of dairy a day, or if you are lactose intolerant take a calcium with vitamin D daily.  Your vaccines are up to date.

## 2019-08-16 NOTE — Patient Instructions (Signed)
Preventive Care 38 Years and Older, Female Preventive care refers to lifestyle choices and visits with your health care provider that can promote health and wellness. This includes:  A yearly physical exam. This is also called an annual well check.  Regular dental and eye exams.  Immunizations.  Screening for certain conditions.  Healthy lifestyle choices, such as diet and exercise. What can I expect for my preventive care visit? Physical exam Your health care provider will check:  Height and weight. These may be used to calculate body mass index (BMI), which is a measurement that tells if you are at a healthy weight.  Heart rate and blood pressure.  Your skin for abnormal spots. Counseling Your health care provider may ask you questions about:  Alcohol, tobacco, and drug use.  Emotional well-being.  Home and relationship well-being.  Sexual activity.  Eating habits.  History of falls.  Memory and ability to understand (cognition).  Work and work Statistician.  Pregnancy and menstrual history. What immunizations do I need?  Influenza (flu) vaccine  This is recommended every year. Tetanus, diphtheria, and pertussis (Tdap) vaccine  You may need a Td booster every 10 years. Varicella (chickenpox) vaccine  You may need this vaccine if you have not already been vaccinated. Zoster (shingles) vaccine  You may need this after age 33. Pneumococcal conjugate (PCV13) vaccine  One dose is recommended after age 33. Pneumococcal polysaccharide (PPSV23) vaccine  One dose is recommended after age 72. Measles, mumps, and rubella (MMR) vaccine  You may need at least one dose of MMR if you were born in 1957 or later. You may also need a second dose. Meningococcal conjugate (MenACWY) vaccine  You may need this if you have certain conditions. Hepatitis A vaccine  You may need this if you have certain conditions or if you travel or work in places where you may be exposed  to hepatitis A. Hepatitis B vaccine  You may need this if you have certain conditions or if you travel or work in places where you may be exposed to hepatitis B. Haemophilus influenzae type b (Hib) vaccine  You may need this if you have certain conditions. You may receive vaccines as individual doses or as more than one vaccine together in one shot (combination vaccines). Talk with your health care provider about the risks and benefits of combination vaccines. What tests do I need? Blood tests  Lipid and cholesterol levels. These may be checked every 5 years, or more frequently depending on your overall health.  Hepatitis C test.  Hepatitis B test. Screening  Lung cancer screening. You may have this screening every year starting at age 39 if you have a 30-pack-year history of smoking and currently smoke or have quit within the past 15 years.  Colorectal cancer screening. All adults should have this screening starting at age 36 and continuing until age 15. Your health care provider may recommend screening at age 23 if you are at increased risk. You will have tests every 1-10 years, depending on your results and the type of screening test.  Diabetes screening. This is done by checking your blood sugar (glucose) after you have not eaten for a while (fasting). You may have this done every 1-3 years.  Mammogram. This may be done every 1-2 years. Talk with your health care provider about how often you should have regular mammograms.  BRCA-related cancer screening. This may be done if you have a family history of breast, ovarian, tubal, or peritoneal cancers.  Other tests  Sexually transmitted disease (STD) testing.  Bone density scan. This is done to screen for osteoporosis. You may have this done starting at age 44. Follow these instructions at home: Eating and drinking  Eat a diet that includes fresh fruits and vegetables, whole grains, lean protein, and low-fat dairy products. Limit  your intake of foods with high amounts of sugar, saturated fats, and salt.  Take vitamin and mineral supplements as recommended by your health care provider.  Do not drink alcohol if your health care provider tells you not to drink.  If you drink alcohol: ? Limit how much you have to 0-1 drink a day. ? Be aware of how much alcohol is in your drink. In the [place] one drink equals one 12 oz bottle of beer (355 mL), one 5 oz glass of wine (148 mL), or one 1 oz glass of hard liquor (44 mL). Lifestyle  Take daily care of your teeth and gums.  Stay active. Exercise for at least 30 minutes on 5 or more days each week.  Do not use any products that contain nicotine or tobacco, such as cigarettes, e-cigarettes, and chewing tobacco. If you need help quitting, ask your health care provider.  If you are sexually active, practice safe sex. Use a condom or other form of protection in order to prevent STIs (sexually transmitted infections).  Talk with your health care provider about taking a low-dose aspirin or statin. What's next?  Go to your health care provider once a year for a well check visit.  Ask your health care provider how often you should have your eyes and teeth checked.  Stay up to date on all vaccines. This information is not intended to replace advice given to you by your health care provider. Make sure you discuss any questions you have with your health care provider. Document Revised: 04/09/2018 Document Reviewed: 04/09/2018 Elsevier Patient Education  2020 [employer].

## 2019-08-17 LAB — CYTOLOGY - PAP
Comment: NEGATIVE
Diagnosis: NEGATIVE
High risk HPV: NEGATIVE

## 2019-08-18 NOTE — Progress Notes (Signed)
Call patient: Your Pap smear is normal. Repeat in 5 years.

## 2019-08-30 ENCOUNTER — Encounter: Payer: Self-pay | Admitting: Family Medicine

## 2019-12-20 DIAGNOSIS — Z20822 Contact with and (suspected) exposure to covid-19: Secondary | ICD-10-CM | POA: Diagnosis not present

## 2020-04-17 ENCOUNTER — Other Ambulatory Visit: Payer: Self-pay

## 2020-04-17 ENCOUNTER — Encounter: Payer: Self-pay | Admitting: Nurse Practitioner

## 2020-04-17 ENCOUNTER — Ambulatory Visit (INDEPENDENT_AMBULATORY_CARE_PROVIDER_SITE_OTHER): Payer: PPO | Admitting: Nurse Practitioner

## 2020-04-17 VITALS — BP 157/80 | HR 68 | Temp 98.3°F | Ht 67.0 in | Wt 196.3 lb

## 2020-04-17 DIAGNOSIS — B028 Zoster with other complications: Secondary | ICD-10-CM | POA: Diagnosis not present

## 2020-04-17 MED ORDER — VALACYCLOVIR HCL 1 G PO TABS: 1000.0000 mg | ORAL_TABLET | Freq: Three times a day (TID) | ORAL | 0 refills | Status: DC

## 2020-04-17 MED ORDER — GABAPENTIN 100 MG PO CAPS: ORAL_CAPSULE | ORAL | 0 refills | Status: DC

## 2020-04-17 MED ORDER — AMBULATORY NON FORMULARY MEDICATION: 0 refills | Status: DC

## 2020-04-17 NOTE — Patient Instructions (Signed)
Shingles  Shingles, which is also known as herpes zoster, is an infection that causes a painful skin rash and fluid-filled blisters. It is caused by a virus. Shingles only develops in people who:  Have had chickenpox.  Have been given a medicine to protect against chickenpox (have been vaccinated). Shingles is rare in this group. What are the causes? Shingles is caused by varicella-zoster virus (VZV). This is the same virus that causes chickenpox. After a person is exposed to VZV, the virus stays in the body in an inactive (dormant) state. Shingles develops if the virus is reactivated. This can happen many years after the first (initial) exposure to VZV. It is not known what causes this virus to be reactivated. What increases the risk? People who have had chickenpox or received the chickenpox vaccine are at risk for shingles. Shingles infection is more common in people who:  Are older than age 60.  Have a weakened disease-fighting system (immune system), such as people with: ? HIV. ? AIDS. ? Cancer.  Are taking medicines that weaken the immune system, such as transplant medicines.  Are experiencing a lot of stress. What are the signs or symptoms? Dare Sanger symptoms of this condition include itching, tingling, and pain in an area on your skin. Pain may be described as burning, stabbing, or throbbing. A few days or weeks after Ruvim Risko symptoms start, a painful red rash appears. The rash is usually on one side of the body and has a band-like or belt-like pattern. The rash eventually turns into fluid-filled blisters that break open, change into scabs, and dry up in about 2-3 weeks. At any time during the infection, you may also develop:  A fever.  Chills.  A headache.  An upset stomach. How is this diagnosed? This condition is diagnosed with a skin exam. Skin or fluid samples may be taken from the blisters before a diagnosis is made. These samples are examined under a microscope or sent to  a lab for testing. How is this treated? The rash may last for several weeks. There is not a specific cure for this condition. Your health care provider will probably prescribe medicines to help you manage pain, recover more quickly, and avoid long-term problems. Medicines may include:  Antiviral drugs.  Anti-inflammatory drugs.  Pain medicines.  Anti-itching medicines (antihistamines). If the area involved is on your face, you may be referred to a specialist, such as an eye doctor (ophthalmologist) or an ear, nose, and throat (ENT) doctor (otolaryngologist) to help you avoid eye problems, chronic pain, or disability. Follow these instructions at home: Medicines  Take over-the-counter and prescription medicines only as told by your health care provider.  Apply an anti-itch cream or numbing cream to the affected area as told by your health care provider. Relieving itching and discomfort   Apply cold, wet cloths (cold compresses) to the area of the rash or blisters as told by your health care provider.  Cool baths can be soothing. Try adding baking soda or dry oatmeal to the water to reduce itching. Do not bathe in hot water. Blister and rash care  Keep your rash covered with a loose bandage (dressing). Wear loose-fitting clothing to help ease the pain of material rubbing against the rash.  Keep your rash and blisters clean by washing the area with mild soap and cool water as told by your health care provider.  Check your rash every day for signs of infection. Check for: ? More redness, swelling, or pain. ? Fluid   or blood. ? Warmth. ? Pus or a bad smell.  Do not scratch your rash or pick at your blisters. To help avoid scratching: ? Keep your fingernails clean and cut short. ? Wear gloves or mittens while you sleep, if scratching is a problem. General instructions  Rest as told by your health care provider.  Keep all follow-up visits as told by your health care provider. This  is important.  Wash your hands often with soap and water. If soap and water are not available, use hand sanitizer. Doing this lowers your chance of getting a bacterial skin infection.  Before your blisters change into scabs, your shingles infection can cause chickenpox in people who have never had it or have never been vaccinated against it. To prevent this from happening, avoid contact with other people, especially: ? Babies. ? Pregnant women. ? Children who have eczema. ? Elderly people who have transplants. ? People who have chronic illnesses, such as cancer or AIDS. Contact a health care provider if:  Your pain is not relieved with prescribed medicines.  Your pain does not get better after the rash heals.  You have signs of infection in the rash area, such as: ? More redness, swelling, or pain around the rash. ? Fluid or blood coming from the rash. ? The rash area feeling warm to the touch. ? Pus or a bad smell coming from the rash. Get help right away if:  The rash is on your face or nose.  You have facial pain, pain around your eye area, or loss of feeling on one side of your face.  You have difficulty seeing.  You have ear pain or have ringing in your ear.  You have a loss of taste.  Your condition gets worse. Summary  Shingles, which is also known as herpes zoster, is an infection that causes a painful skin rash and fluid-filled blisters.  This condition is diagnosed with a skin exam. Skin or fluid samples may be taken from the blisters and examined before the diagnosis is made.  Keep your rash covered with a loose bandage (dressing). Wear loose-fitting clothing to help ease the pain of material rubbing against the rash.  Before your blisters change into scabs, your shingles infection can cause chickenpox in people who have never had it or have never been vaccinated against it. This information is not intended to replace advice given to you by your health care  provider. Make sure you discuss any questions you have with your health care provider. Document Revised: 08/07/2018 Document Reviewed: 12/18/2016 Elsevier Patient Education  2020 [employer]  

## 2020-04-17 NOTE — Progress Notes (Signed)
Acute Office Visit  Subjective:    Patient ID: Kristine Bowman, female    DOB: 1955-10-27, 64 y.o.   MRN: [id number]  Chief Complaint  Patient presents with   Rash    HPI Patient is in today for painful, red rash to right thoracic region that started on Saturday.   SHINGLES Duration: 3 days Location:  Started in right axillary region and moved to the right thoracic area and then to the right breast.  Painful:  yes Severity: moderate to severe at times.  Paresthesia:  yes Hyperesthesia: no Itching:  yes Burning:  yes Oozing:  no Blisters:  yes Fevers:  no History of the same:  no Alleviating factors: Has been using calamine lotion with some relief Status: worse Treatments attempted: calamine lotion Previous vaccination: No   Denies pustular areas, drainage, or fevers.    Past Medical History:  Diagnosis Date   KNEE PAIN 06/26/2010   Qualifier: Diagnosis of  By: Linford Arnold MD, Verdie Drown PAIN, RIGHT 07/09/2010   Qualifier: Diagnosis of  By: Linford Arnold MD, Santina Evans      History reviewed. No pertinent surgical history.  Family History  Problem Relation Age of Onset   Heart disease Father 83       quadruple bypass   Hypertension Mother 16   Heart disease Maternal Grandfather    Breast cancer Paternal Grandmother    Heart disease Paternal Grandfather     Social History   Socioeconomic History   Marital status: Single    Spouse name: Not on file   Number of children: 2   Years of education: Not on file   Highest education level: Not on file  Occupational History   Occupation: Airline pilot ACCOUNT MANAGE    Employer: [employer]  Tobacco Use   Smoking status: Never Smoker   Smokeless tobacco: Never Used  Vaping Use   Vaping Use: Never used  Substance and Sexual Activity   Alcohol use: Yes    Alcohol/week: 3.0 standard drinks    Types: 3 Standard drinks or equivalent per week   Drug use: No   Sexual activity: Yes    Partners: Male   Other Topics Concern   Not on file  Social History Narrative   Not on file   Social Determinants of Health   Financial Resource Strain: Not on file  Food Insecurity: Not on file  Transportation Needs: Not on file  Physical Activity: Not on file  Stress: Not on file  Social Connections: Not on file  Intimate Partner Violence: Not on file    No outpatient medications prior to visit.   No facility-administered medications prior to visit.    Allergies  Allergen Reactions   Betadine [Povidone Iodine]    Codeine    Tramadol Nausea And Vomiting   Meloxicam Itching and Rash    Review of Systems All review of systems negative except what is listed in the HPI     Objective:    Physical Exam Vitals and nursing note reviewed.  Constitutional:      Appearance: Normal appearance.  HENT:     Head: Normocephalic.  Eyes:     Extraocular Movements: Extraocular movements intact.     Conjunctiva/sclera: Conjunctivae normal.     Pupils: Pupils are equal, round, and reactive to light.  Cardiovascular:     Rate and Rhythm: Normal rate and regular rhythm.  Pulmonary:     Effort: Pulmonary effort is normal.  Musculoskeletal:  General: Normal range of motion.     Cervical back: Normal range of motion.  Skin:    General: Skin is warm.     Capillary Refill: Capillary refill takes less than 2 seconds.     Findings: Erythema, lesion and rash present. Rash is vesicular.          Comments: Erythematous scattered vesicular rash following dermotome path noted on the right thoracic region from the spine to the axilla and the right breast. No pustules noted, no drainage, no signs of infection.   Neurological:     General: No focal deficit present.     Mental Status: She is alert and oriented to person, place, and time.     Sensory: Sensory deficit present.     Motor: No weakness.  Psychiatric:        Mood and Affect: Mood normal.        Behavior: Behavior normal.         Thought Content: Thought content normal.        Judgment: Judgment normal.     Ht 5\' 7"  (1.702 m)    BMI 30.38 kg/m  Wt Readings from Last 3 Encounters:  08/16/19 194 lb (88 kg)  01/24/15 197 lb (89.4 kg)  06/01/13 198 lb (89.8 kg)    Health Maintenance Due  Topic Date Due   INFLUENZA VACCINE  Never done    There are no preventive care reminders to display for this patient.   Lab Results  Component Value Date   TSH 0.834 01/17/2011   Lab Results  Component Value Date   WBC 5.1 08/16/2019   HGB 13.1 08/16/2019   HCT 39.5 08/16/2019   MCV 92.5 08/16/2019   PLT 219 08/16/2019   Lab Results  Component Value Date   NA 139 08/16/2019   K 4.6 08/16/2019   CO2 26 08/16/2019   GLUCOSE 91 08/16/2019   BUN 17 08/16/2019   CREATININE 1.11 (H) 08/16/2019   BILITOT 0.6 08/16/2019   ALKPHOS 60 01/24/2015   AST 12 08/16/2019   ALT 16 08/16/2019   PROT 6.8 08/16/2019   ALBUMIN 4.3 01/24/2015   CALCIUM 9.3 08/16/2019   Lab Results  Component Value Date   CHOL 204 (H) 08/16/2019   Lab Results  Component Value Date   HDL 56 08/16/2019   Lab Results  Component Value Date   LDLCALC 126 (H) 08/16/2019   Lab Results  Component Value Date   TRIG 113 08/16/2019   Lab Results  Component Value Date   CHOLHDL 3.6 08/16/2019   No results found for: HGBA1C     Assessment & Plan:   1. Herpes zoster with other complication Symptoms and presentation consisted with herpes zoster infection to the right thoracic region.  Discussed measures to help prevent irritation to the area.  Information provided in handout for patient. Recommend valacyclovir 1000mg  TID x 7 days. May also take gabapentin 100mg  to 300mg  up to three times a day for neuropathic pain not well controlled with tylenol or ibuprofen.  Discussed warning signs that would indicate infection or worsening symptoms that would warrant repeat evaluation.  Follow-up if symptoms worsen or fail to improve.   Discussed  Shingrix vaccine with patient- printed prescription provided for patient with instructions to wait until infection is cleared prior to starting the vaccine series.   - valACYclovir (VALTREX) 1000 MG tablet; Take 1 tablet (1,000 mg total) by mouth 3 (three) times daily.  Dispense: 21 tablet; Refill: 0 -  gabapentin (NEURONTIN) 100 MG capsule; Take 1 (100mg ) to 3 (300mg ) capsules up to three times a day as needed for nerve pain. Start with lowest dose and increase if not effective.  Dispense: 60 capsule; Refill: 0   , NP

## 2020-05-01 ENCOUNTER — Encounter: Payer: Self-pay | Admitting: Nurse Practitioner

## 2020-05-05 ENCOUNTER — Other Ambulatory Visit: Payer: Self-pay | Admitting: Nurse Practitioner

## 2020-05-05 DIAGNOSIS — B028 Zoster with other complications: Secondary | ICD-10-CM

## 2020-05-05 MED ORDER — GABAPENTIN 100 MG PO CAPS: ORAL_CAPSULE | ORAL | 2 refills | Status: DC

## 2020-07-17 DIAGNOSIS — Z1231 Encounter for screening mammogram for malignant neoplasm of breast: Secondary | ICD-10-CM | POA: Diagnosis not present

## 2020-07-17 LAB — HM MAMMOGRAPHY

## 2020-07-22 DIAGNOSIS — S92031A Displaced avulsion fracture of tuberosity of right calcaneus, initial encounter for closed fracture: Secondary | ICD-10-CM | POA: Diagnosis not present

## 2020-07-22 DIAGNOSIS — S92001A Unspecified fracture of right calcaneus, initial encounter for closed fracture: Secondary | ICD-10-CM | POA: Diagnosis not present

## 2020-07-22 DIAGNOSIS — M25571 Pain in right ankle and joints of right foot: Secondary | ICD-10-CM | POA: Diagnosis not present

## 2020-07-23 ENCOUNTER — Encounter: Payer: Self-pay | Admitting: Family Medicine

## 2020-07-26 ENCOUNTER — Other Ambulatory Visit (HOSPITAL_COMMUNITY)
Admission: RE | Admit: 2020-07-26 | Discharge: 2020-07-26 | Disposition: A | Discharge status: Home / Self Care | Payer: PPO | Source: Ambulatory Visit | Attending: Orthopedic Surgery | Admitting: Orthopedic Surgery

## 2020-07-26 ENCOUNTER — Other Ambulatory Visit: Payer: Self-pay

## 2020-07-26 ENCOUNTER — Other Ambulatory Visit (HOSPITAL_COMMUNITY): Payer: Self-pay | Admitting: Orthopedic Surgery

## 2020-07-26 ENCOUNTER — Encounter: Payer: Self-pay | Admitting: Family Medicine

## 2020-07-26 ENCOUNTER — Encounter (HOSPITAL_BASED_OUTPATIENT_CLINIC_OR_DEPARTMENT_OTHER): Payer: Self-pay | Admitting: Orthopedic Surgery

## 2020-07-26 DIAGNOSIS — Z20822 Contact with and (suspected) exposure to covid-19: Secondary | ICD-10-CM | POA: Diagnosis not present

## 2020-07-26 DIAGNOSIS — Z01812 Encounter for preprocedural laboratory examination: Secondary | ICD-10-CM | POA: Insufficient documentation

## 2020-07-26 DIAGNOSIS — S92031A Displaced avulsion fracture of tuberosity of right calcaneus, initial encounter for closed fracture: Secondary | ICD-10-CM | POA: Diagnosis not present

## 2020-07-26 LAB — SARS CORONAVIRUS 2 (TAT 6-24 HRS): SARS Coronavirus 2: NEGATIVE

## 2020-07-27 ENCOUNTER — Ambulatory Visit (HOSPITAL_BASED_OUTPATIENT_CLINIC_OR_DEPARTMENT_OTHER): Payer: PPO | Admitting: Certified Registered"

## 2020-07-27 ENCOUNTER — Encounter (HOSPITAL_BASED_OUTPATIENT_CLINIC_OR_DEPARTMENT_OTHER)
Admission: RE | Disposition: A | Discharge status: Home / Self Care | Payer: Self-pay | Source: Home / Self Care | Attending: Orthopedic Surgery

## 2020-07-27 ENCOUNTER — Ambulatory Visit (HOSPITAL_BASED_OUTPATIENT_CLINIC_OR_DEPARTMENT_OTHER)
Admission: RE | Admit: 2020-07-27 | Discharge: 2020-07-27 | Disposition: A | Discharge status: Home / Self Care | Payer: PPO | Attending: Orthopedic Surgery | Admitting: Orthopedic Surgery

## 2020-07-27 ENCOUNTER — Encounter (HOSPITAL_BASED_OUTPATIENT_CLINIC_OR_DEPARTMENT_OTHER): Payer: Self-pay | Admitting: Orthopedic Surgery

## 2020-07-27 ENCOUNTER — Other Ambulatory Visit: Payer: Self-pay

## 2020-07-27 DIAGNOSIS — X501XXA Overexertion from prolonged static or awkward postures, initial encounter: Secondary | ICD-10-CM | POA: Diagnosis not present

## 2020-07-27 DIAGNOSIS — S92001A Unspecified fracture of right calcaneus, initial encounter for closed fracture: Secondary | ICD-10-CM | POA: Insufficient documentation

## 2020-07-27 DIAGNOSIS — Z888 Allergy status to other drugs, medicaments and biological substances status: Secondary | ICD-10-CM | POA: Insufficient documentation

## 2020-07-27 DIAGNOSIS — Z885 Allergy status to narcotic agent status: Secondary | ICD-10-CM | POA: Diagnosis not present

## 2020-07-27 DIAGNOSIS — Y9301 Activity, walking, marching and hiking: Secondary | ICD-10-CM | POA: Insufficient documentation

## 2020-07-27 DIAGNOSIS — G8918 Other acute postprocedural pain: Secondary | ICD-10-CM | POA: Diagnosis not present

## 2020-07-27 HISTORY — PX: ORIF CALCANEOUS FRACTURE: SHX5030

## 2020-07-27 HISTORY — DX: Unspecified fracture of right calcaneus, initial encounter for closed fracture: S92.001A

## 2020-07-27 SURGERY — OPEN REDUCTION INTERNAL FIXATION (ORIF) CALCANEOUS FRACTURE
Anesthesia: General | Site: Heel | Laterality: Right

## 2020-07-27 MED ORDER — OXYCODONE HCL 5 MG/5ML PO SOLN: 5.0000 mg | Freq: Once | ORAL | Status: DC | PRN

## 2020-07-27 MED ORDER — FENTANYL CITRATE (PF) 100 MCG/2ML IJ SOLN
INTRAMUSCULAR | Status: DC | PRN
  Administered 2020-07-27: 50 ug via INTRAVENOUS

## 2020-07-27 MED ORDER — LACTATED RINGERS IV SOLN: INTRAVENOUS | Status: DC

## 2020-07-27 MED ORDER — CEFAZOLIN SODIUM-DEXTROSE 2-4 GM/100ML-% IV SOLN
INTRAVENOUS | Status: AC
  Filled 2020-07-27: qty 100

## 2020-07-27 MED ORDER — VANCOMYCIN HCL 500 MG IV SOLR
INTRAVENOUS | Status: DC | PRN
  Administered 2020-07-27: 500 mg via TOPICAL

## 2020-07-27 MED ORDER — BUPIVACAINE HCL (PF) 0.5 % IJ SOLN
INTRAMUSCULAR | Status: DC | PRN
Start: 2020-07-27 — End: 2020-07-27
  Administered 2020-07-27: 30 mL via PERINEURAL

## 2020-07-27 MED ORDER — ONDANSETRON HCL 4 MG/2ML IJ SOLN
INTRAMUSCULAR | Status: DC | PRN
  Administered 2020-07-27: 4 mg via INTRAVENOUS

## 2020-07-27 MED ORDER — ROCURONIUM BROMIDE 10 MG/ML (PF) SYRINGE
PREFILLED_SYRINGE | INTRAVENOUS | Status: AC
  Filled 2020-07-27: qty 10

## 2020-07-27 MED ORDER — OXYCODONE HCL 5 MG PO TABS: 5.0000 mg | ORAL_TABLET | Freq: Once | ORAL | Status: DC | PRN

## 2020-07-27 MED ORDER — FENTANYL CITRATE (PF) 100 MCG/2ML IJ SOLN
100.0000 ug | Freq: Once | INTRAMUSCULAR | Status: AC
  Administered 2020-07-27: 50 ug via INTRAVENOUS

## 2020-07-27 MED ORDER — PROPOFOL 10 MG/ML IV BOLUS
INTRAVENOUS | Status: AC
  Filled 2020-07-27: qty 20

## 2020-07-27 MED ORDER — SODIUM CHLORIDE 0.9 % IV SOLN: INTRAVENOUS | Status: DC

## 2020-07-27 MED ORDER — FENTANYL CITRATE (PF) 100 MCG/2ML IJ SOLN
INTRAMUSCULAR | Status: AC
  Filled 2020-07-27: qty 2

## 2020-07-27 MED ORDER — SUGAMMADEX SODIUM 200 MG/2ML IV SOLN
INTRAVENOUS | Status: DC | PRN
  Administered 2020-07-27: 200 mg via INTRAVENOUS

## 2020-07-27 MED ORDER — PROPOFOL 10 MG/ML IV BOLUS
INTRAVENOUS | Status: DC | PRN
  Administered 2020-07-27: 130 mg via INTRAVENOUS

## 2020-07-27 MED ORDER — ONDANSETRON HCL 4 MG/2ML IJ SOLN
4.0000 mg | Freq: Four times a day (QID) | INTRAMUSCULAR | Status: DC | PRN
Start: 2020-07-27 — End: 2020-07-27

## 2020-07-27 MED ORDER — OXYCODONE HCL 5 MG PO TABS: 5.0000 mg | ORAL_TABLET | Freq: Four times a day (QID) | ORAL | 0 refills | Status: AC | PRN

## 2020-07-27 MED ORDER — LIDOCAINE HCL (CARDIAC) PF 100 MG/5ML IV SOSY
PREFILLED_SYRINGE | INTRAVENOUS | Status: DC | PRN
  Administered 2020-07-27: 80 mg via INTRAVENOUS

## 2020-07-27 MED ORDER — DEXAMETHASONE SODIUM PHOSPHATE 4 MG/ML IJ SOLN
INTRAMUSCULAR | Status: DC | PRN
  Administered 2020-07-27: 5 mg via INTRAVENOUS

## 2020-07-27 MED ORDER — MIDAZOLAM HCL 2 MG/2ML IJ SOLN
2.0000 mg | Freq: Once | INTRAMUSCULAR | Status: AC
  Administered 2020-07-27: 1 mg via INTRAVENOUS

## 2020-07-27 MED ORDER — ROCURONIUM BROMIDE 100 MG/10ML IV SOLN
INTRAVENOUS | Status: DC | PRN
  Administered 2020-07-27: 55 mg via INTRAVENOUS

## 2020-07-27 MED ORDER — MIDAZOLAM HCL 2 MG/2ML IJ SOLN
INTRAMUSCULAR | Status: AC
  Filled 2020-07-27: qty 2

## 2020-07-27 MED ORDER — LIDOCAINE 2% (20 MG/ML) 5 ML SYRINGE
INTRAMUSCULAR | Status: AC
  Filled 2020-07-27: qty 5

## 2020-07-27 MED ORDER — EPHEDRINE 5 MG/ML INJ
INTRAVENOUS | Status: AC
  Filled 2020-07-27: qty 10

## 2020-07-27 MED ORDER — ONDANSETRON HCL 4 MG/2ML IJ SOLN
INTRAMUSCULAR | Status: AC
  Filled 2020-07-27: qty 2

## 2020-07-27 MED ORDER — DEXAMETHASONE SODIUM PHOSPHATE 10 MG/ML IJ SOLN
INTRAMUSCULAR | Status: AC
  Filled 2020-07-27: qty 1

## 2020-07-27 MED ORDER — EPHEDRINE SULFATE 50 MG/ML IJ SOLN
INTRAMUSCULAR | Status: DC | PRN
  Administered 2020-07-27: 10 mg via INTRAVENOUS

## 2020-07-27 MED ORDER — CEFAZOLIN SODIUM-DEXTROSE 2-4 GM/100ML-% IV SOLN
2.0000 g | INTRAVENOUS | Status: AC
  Administered 2020-07-27: 2 g via INTRAVENOUS

## 2020-07-27 MED ORDER — FENTANYL CITRATE (PF) 100 MCG/2ML IJ SOLN: 25.0000 ug | INTRAMUSCULAR | Status: DC | PRN

## 2020-07-27 SURGICAL SUPPLY — 65 items
APL PRP STRL LF DISP 70% ISPRP (MISCELLANEOUS) ×1
BANDAGE ESMARK 6X9 LF (GAUZE/BANDAGES/DRESSINGS) ×1 IMPLANT
BIT DRILL 4.8X200 CANN (BIT) ×2 IMPLANT
BLADE SURG 15 STRL LF DISP TIS (BLADE) ×2 IMPLANT
BLADE SURG 15 STRL SS (BLADE) ×4
BNDG CMPR 9X6 STRL LF SNTH (GAUZE/BANDAGES/DRESSINGS) ×1
BNDG COHESIVE 4X5 TAN STRL (GAUZE/BANDAGES/DRESSINGS) ×2 IMPLANT
BNDG COHESIVE 6X5 TAN STRL LF (GAUZE/BANDAGES/DRESSINGS) ×2 IMPLANT
BNDG ESMARK 6X9 LF (GAUZE/BANDAGES/DRESSINGS) ×2
CHLORAPREP W/TINT 26 (MISCELLANEOUS) ×2 IMPLANT
COVER BACK TABLE 60X90IN (DRAPES) ×2 IMPLANT
COVER WAND RF STERILE (DRAPES) IMPLANT
CUFF TOURN SGL QUICK 34 (TOURNIQUET CUFF) ×2
CUFF TRNQT CYL 34X4.125X (TOURNIQUET CUFF) ×1 IMPLANT
DECANTER SPIKE VIAL GLASS SM (MISCELLANEOUS) IMPLANT
DRAPE EXTREMITY T 121X128X90 (DISPOSABLE) ×2 IMPLANT
DRAPE OEC MINIVIEW 54X84 (DRAPES) ×2 IMPLANT
DRAPE U-SHAPE 47X51 STRL (DRAPES) ×2 IMPLANT
DRSG MEPITEL 4X7.2 (GAUZE/BANDAGES/DRESSINGS) ×2 IMPLANT
DRSG PAD ABDOMINAL 8X10 ST (GAUZE/BANDAGES/DRESSINGS) ×4 IMPLANT
ELECT REM PT RETURN 9FT ADLT (ELECTROSURGICAL) ×2
ELECTRODE REM PT RTRN 9FT ADLT (ELECTROSURGICAL) ×1 IMPLANT
GAUZE SPONGE 4X4 12PLY STRL (GAUZE/BANDAGES/DRESSINGS) ×2 IMPLANT
GLOVE SRG 8 PF TXTR STRL LF DI (GLOVE) ×1 IMPLANT
GLOVE SURG ENC MOIS LTX SZ8 (GLOVE) IMPLANT
GLOVE SURG LTX SZ8 (GLOVE) ×4 IMPLANT
GLOVE SURG UNDER POLY LF SZ7 (GLOVE) ×2 IMPLANT
GLOVE SURG UNDER POLY LF SZ8 (GLOVE) ×2
GOWN STRL REUS W/ TWL LRG LVL3 (GOWN DISPOSABLE) ×1 IMPLANT
GOWN STRL REUS W/ TWL XL LVL3 (GOWN DISPOSABLE) ×1 IMPLANT
GOWN STRL REUS W/TWL LRG LVL3 (GOWN DISPOSABLE) ×2
GOWN STRL REUS W/TWL XL LVL3 (GOWN DISPOSABLE) ×2
NS IRRIG 1000ML POUR BTL (IV SOLUTION) ×2 IMPLANT
PACK BASIN DAY SURGERY FS (CUSTOM PROCEDURE TRAY) ×2 IMPLANT
PAD CAST 4YDX4 CTTN HI CHSV (CAST SUPPLIES) ×1 IMPLANT
PADDING CAST ABS 4INX4YD NS (CAST SUPPLIES)
PADDING CAST ABS COTTON 4X4 ST (CAST SUPPLIES) IMPLANT
PADDING CAST COTTON 4X4 STRL (CAST SUPPLIES) ×2
PADDING CAST COTTON 6X4 STRL (CAST SUPPLIES) ×2 IMPLANT
PENCIL SMOKE EVACUATOR (MISCELLANEOUS) ×2 IMPLANT
PIN GUIDE DRILL TIP 2.8X300 (DRILL) ×2 IMPLANT
SANITIZER HAND PURELL 535ML FO (MISCELLANEOUS) ×2 IMPLANT
SCREW CANN 45X16X6.5 (Screw) ×1 IMPLANT
SCREW CANN 6.5X45 (Screw) ×2 IMPLANT
SCREW CANN FULL THD 6.5X40 (Screw) ×4 IMPLANT
SHEET MEDIUM DRAPE 40X70 STRL (DRAPES) ×4 IMPLANT
SLEEVE SCD COMPRESS KNEE MED (STOCKING) ×2 IMPLANT
SPLINT FAST PLASTER 5X30 (CAST SUPPLIES) ×20
SPLINT PLASTER CAST FAST 5X30 (CAST SUPPLIES) ×20 IMPLANT
SPONGE LAP 18X18 RF (DISPOSABLE) ×2 IMPLANT
STOCKINETTE 6  STRL (DRAPES) ×2
STOCKINETTE 6 STRL (DRAPES) ×1 IMPLANT
SUCTION FRAZIER HANDLE 10FR (MISCELLANEOUS) ×2
SUCTION TUBE FRAZIER 10FR DISP (MISCELLANEOUS) ×1 IMPLANT
SUT ETHILON 3 0 PS 1 (SUTURE) ×2 IMPLANT
SUT MNCRL AB 3-0 PS2 18 (SUTURE) ×2 IMPLANT
SUT VIC AB 0 SH 27 (SUTURE) ×2 IMPLANT
SUT VIC AB 2-0 SH 18 (SUTURE) IMPLANT
SUT VIC AB 2-0 SH 27 (SUTURE) ×2
SUT VIC AB 2-0 SH 27XBRD (SUTURE) ×1 IMPLANT
SYR BULB EAR ULCER 3OZ GRN STR (SYRINGE) ×2 IMPLANT
TOWEL GREEN STERILE FF (TOWEL DISPOSABLE) ×4 IMPLANT
TUBE CONNECTING 20X1/4 (TUBING) ×2 IMPLANT
UNDERPAD 30X36 HEAVY ABSORB (UNDERPADS AND DIAPERS) ×2 IMPLANT
WASHER FLAT 6.5MM (Washer) ×4 IMPLANT

## 2020-07-27 NOTE — Anesthesia Preprocedure Evaluation (Addendum)
Anesthesia Evaluation  Patient identified by MRN, date of birth, ID band Patient awake    Reviewed: Allergy & Precautions, H&P , NPO status , Patient's Chart, lab work & pertinent test results  Airway Mallampati: II   Neck ROM: full    Dental   Pulmonary neg pulmonary ROS,    breath sounds clear to auscultation       Cardiovascular negative cardio ROS   Rhythm:regular Rate:Normal     Neuro/Psych    GI/Hepatic   Endo/Other  obese  Renal/GU      Musculoskeletal Right calcaneal fx   Abdominal   Peds  Hematology   Anesthesia Other Findings   Reproductive/Obstetrics                             Anesthesia Physical Anesthesia Plan  ASA: II  Anesthesia Plan: General   Post-op Pain Management:  Regional for Post-op pain   Induction: Intravenous  PONV Risk Score and Plan: 3 and Ondansetron, Dexamethasone, Treatment may vary due to age or medical condition and Midazolam  Airway Management Planned: Oral ETT  Additional Equipment:   Intra-op Plan:   Post-operative Plan: Extubation in OR  Informed Consent: I have reviewed the patients History and Physical, chart, labs and discussed the procedure including the risks, benefits and alternatives for the proposed anesthesia with the patient or authorized representative who has indicated his/her understanding and acceptance.     Dental advisory given  Plan Discussed with: CRNA, Anesthesiologist and Surgeon  Anesthesia Plan Comments:         Anesthesia Quick Evaluation

## 2020-07-27 NOTE — Anesthesia Procedure Notes (Signed)
Anesthesia Regional Block: Popliteal block   Pre-Anesthetic Checklist: ,, timeout performed, Correct Patient, Correct Site, Correct Laterality, Correct Procedure, Correct Position, site marked, Risks and benefits discussed,  Surgical consent,  Pre-op evaluation,  At surgeon's request and post-op pain management  Laterality: Right  Prep: chloraprep       Needles:  Injection technique: Single-shot  Needle Type: Echogenic Stimulator Needle          Additional Needles:   Procedures:, nerve stimulator,,,,,,,   Nerve Stimulator or Paresthesia:  Response: plantar flexion of foot, 0.45 mA,   Additional Responses:   Narrative:  Start time: 07/27/2020 2:25 PM End time: 07/27/2020 2:37 PM Injection made incrementally with aspirations every 5 mL.  Performed by: Personally  Anesthesiologist: Achille Rich, MD  Additional Notes: Functioning IV was confirmed and monitors were applied.  A 66mm 21ga Arrow echogenic stimulator needle was used. Sterile prep and drape,hand hygiene and sterile gloves were used.  Negative aspiration and negative test dose prior to incremental administration of local anesthetic. The patient tolerated the procedure well.  Ultrasound guidance: relevent anatomy identified, needle position confirmed, local anesthetic spread visualized around nerve(s), vascular puncture avoided.  Image printed for medical record.

## 2020-07-27 NOTE — H&P (Signed)
Kristine Bowman is an 65 y.o. female.   Chief Complaint: Right heel pain HPI: The patient is a 65 year old female without significant past medical history.  She injured her right lower extremity last weekend while walking in [place].  She twisted her foot off a curb and felt immediate pain in her posterior heel.  She went to the emergency room where radiographs revealed a tongue type fracture of her calcaneus.  She presents now for open treatment with internal fixation of this right calcaneus fracture.  Past Medical History:  Diagnosis Date  . KNEE PAIN 06/26/2010   Qualifier: Diagnosis of  By: Linford Arnold MD, Santina Evans    . Right calcaneal fracture   . SHOULDER PAIN, RIGHT 07/09/2010   Qualifier: Diagnosis of  By: Linford Arnold MD, Santina Evans      Past Surgical History:  Procedure Laterality Date  . COLONOSCOPY      Family History  Problem Relation Age of Onset  . Heart disease Father 70       quadruple bypass  . Hypertension Mother 52  . Heart disease Maternal Grandfather   . Breast cancer Paternal Grandmother   . Heart disease Paternal Grandfather    Social History:  reports that she has never smoked. She has never used smokeless tobacco. She reports current alcohol use of about 3.0 standard drinks of alcohol per week. She reports that she does not use drugs.  Allergies:  Allergies  Allergen Reactions  . Betadine [Povidone Iodine]   . Codeine   . Tramadol Nausea And Vomiting  . Meloxicam Itching and Rash    Medications Prior to Admission  Medication Sig Dispense Refill  . acetaminophen (TYLENOL) 500 MG tablet Take 500 mg by mouth every 6 (six) hours as needed.    [name] HYDROcodone-acetaminophen (NORCO/VICODIN) 5-325 MG tablet Take 1 tablet by mouth every 6 (six) hours as needed for moderate pain.    [name] ondansetron (ZOFRAN) 4 MG tablet Take 4 mg by mouth every 8 (eight) hours as needed for nausea or vomiting.      Results for orders placed or performed during the hospital  encounter of 07/26/20 (from the past 48 hour(s))  SARS CORONAVIRUS 2 (TAT 6-24 HRS) Nasopharyngeal Nasopharyngeal Swab     Status: None   Collection Time: 07/26/20 10:14 AM   Specimen: Nasopharyngeal Swab  Result Value Ref Range   SARS Coronavirus 2 NEGATIVE NEGATIVE    Comment: (NOTE) SARS-CoV-2 target nucleic acids are NOT DETECTED.  The SARS-CoV-2 RNA is generally detectable in upper and lower respiratory specimens during the acute phase of infection. Negative results do not preclude SARS-CoV-2 infection, do not rule out co-infections with other pathogens, and should not be used as the sole basis for treatment or other patient management decisions. Negative results must be combined with clinical observations, patient history, and epidemiological information. The expected result is Negative.  Fact Sheet for Patients: HairSlick.no  Fact Sheet for Healthcare Providers: quierodirigir.com  This test is not yet approved or cleared by the [place] FDA and  has been authorized for detection and/or diagnosis of SARS-CoV-2 by FDA under an Emergency Use Authorization (EUA). This EUA will remain  in effect (meaning this test can be used) for the duration of the COVID-19 declaration under Se ction 564(b)(1) of the Act, 21 U.S.C. section 360bbb-3(b)(1), unless the authorization is terminated or revoked sooner.  Performed at [facility], [address] [place], [place] [address]    No results found.  Review of Systems no  recent fever, chills, nausea, vomiting or changes in her appetite  Blood pressure 133/85, pulse 68, temperature 98.1 F (36.7 C), temperature source Oral, resp. rate 14, height 5\' 7"  (1.702 m), weight 87.1 kg, SpO2 100 %. Physical Exam  Well-nourished well-developed woman in no apparent distress.  Alert and oriented x4.  Normal mood and affect.  Gait is nonweightbearing on the right.  The right foot has  a bony prominence at the posterior ankle proximal from the calcaneus.  She is tender to palpation in this area.  Plantarflexion strength is weak.  Skin is otherwise healthy and intact.  No lymphadenopathy.  Brisk capillary refill at the toes.  Intact sensibility to light touch in the sural nerve distribution.  Assessment/Plan right tongue type calcaneus fracture -to the operating room today for open treatment with internal fixation.  The risks and benefits of the alternative treatment options have been discussed in detail.  The patient wishes to proceed with surgery and specifically understands risks of bleeding, infection, nerve damage, blood clots, need for additional surgery, amputation and death.   , MD August 14, 2020, 3:28 PM

## 2020-07-27 NOTE — Discharge Instructions (Signed)
Wylene Simmer, MD [facility]  Please read the following information regarding your care after surgery.  Medications  You only need a prescription for the narcotic pain medicine (ex. oxycodone, Percocet, Norco).  All of the other medicines listed below are available over the counter. X Aleve 2 pills twice a day for the first 3 days after surgery. X acetominophen (Tylenol) 650 mg every 4-6 hours as you need for minor to moderate pain X oxycodone as prescribed for severe pain  Narcotic pain medicine (ex. oxycodone, Percocet, Vicodin) will cause constipation.  To prevent this problem, take the following medicines while you are taking any pain medicine. X docusate sodium (Colace) 100 mg twice a day X senna (Senokot) 2 tablets twice a day  X To help prevent blood clots, take a baby aspirin (81 mg) twice a day for two weeks after surgery.  You should also get up every hour while you are awake to move around.    Weight Bearing ? Bear weight when you are able on your operated leg or foot. ? Bear weight only on your operated foot in the post-op shoe. X Do not bear any weight on the operated leg or foot.  Cast / Splint / Dressing X Keep your splint, cast or dressing clean and dry.  Don't put anything (coat hanger, pencil, etc) down inside of it.  If it gets damp, use a hair dryer on the cool setting to dry it.  If it gets soaked, call the office to schedule an appointment for a cast change. ? Remove your dressing 3 days after surgery and cover the incisions with dry dressings.    After your dressing, cast or splint is removed; you may shower, but do not soak or scrub the wound.  Allow the water to run over it, and then gently pat it dry.  Swelling It is normal for you to have swelling where you had surgery.  To reduce swelling and pain, keep your toes above your nose for at least 3 days after surgery.  It may be necessary to keep your foot or leg elevated for several weeks.  If it hurts, it should be  elevated.  Follow Up Call my office at [phone] when you are discharged from the hospital or surgery center to schedule an appointment to be seen two weeks after surgery.  Call my office at [phone] if you develop a fever >101.5 F, nausea, vomiting, bleeding from the surgical site or severe pain.       Post Anesthesia Home Care Instructions  Activity: Get plenty of rest for the remainder of the day. A responsible individual must stay with you for 24 hours following the procedure.  For the next 24 hours, DO NOT: -Drive a car -Paediatric nurse -Drink alcoholic beverages -Take any medication unless instructed by your physician -Make any legal decisions or sign important papers.  Meals: Start with liquid foods such as gelatin or soup. Progress to regular foods as tolerated. Avoid greasy, spicy, heavy foods. If nausea and/or vomiting occur, drink only clear liquids until the nausea and/or vomiting subsides. Call your physician if vomiting continues.  Special Instructions/Symptoms: Your throat may feel dry or sore from the anesthesia or the breathing tube placed in your throat during surgery. If this causes discomfort, gargle with warm salt water. The discomfort should disappear within 24 hours.  If you had a scopolamine patch placed behind your ear for the management of post- operative nausea and/or vomiting:  1. The medication in the  patch is effective for 72 hours, after which it should be removed.  Wrap patch in a tissue and discard in the trash. Wash hands thoroughly with soap and water. 2. You may remove the patch earlier than 72 hours if you experience unpleasant side effects which may include dry mouth, dizziness or visual disturbances. 3. Avoid touching the patch. Wash your hands with soap and water after contact with the patch.      Regional Anesthesia Blocks  1. Numbness or the inability to move the "blocked" extremity may last from 3-48 hours after placement. The  length of time depends on the medication injected and your individual response to the medication. If the numbness is not going away after 48 hours, call your surgeon.  2. The extremity that is blocked will need to be protected until the numbness is gone and the  Strength has returned. Because you cannot feel it, you will need to take extra care to avoid injury. Because it may be weak, you may have difficulty moving it or using it. You may not know what position it is in without looking at it while the block is in effect.  3. For blocks in the legs and feet, returning to weight bearing and walking needs to be done carefully. You will need to wait until the numbness is entirely gone and the strength has returned. You should be able to move your leg and foot normally before you try and bear weight or walk. You will need someone to be with you when you first try to ensure you do not fall and possibly risk injury.  4. Bruising and tenderness at the needle site are common side effects and will resolve in a few days.  5. Persistent numbness or new problems with movement should be communicated to the surgeon or the [facility] ([phone])/ [facility] ([phone]).     Call your surgeon if you experience:   1.  Fever over 101.0. 2.  Inability to urinate. 3.  Nausea and/or vomiting. 4.  Extreme swelling or bruising at the surgical site. 5.  Continued bleeding from the incision. 6.  Increased pain, redness or drainage from the incision. 7.  Problems related to your pain medication. 8.  Any problems and/or concerns   

## 2020-07-27 NOTE — Progress Notes (Signed)
Assisted Dr. Hodierne with right, ultrasound guided, popliteal block. Side rails up, monitors on throughout procedure. See vital signs in flow sheet. Tolerated Procedure well. 

## 2020-07-27 NOTE — Transfer of Care (Signed)
Immediate Anesthesia Transfer of Care Note  Patient: Kristine Bowman  Procedure(s) Performed: OPEN REDUCTION INTERNAL FIXATION (ORIF) CALCANEOUS FRACTURE (Right Heel)  Patient Location: PACU  Anesthesia Type:General  Level of Consciousness: drowsy, patient cooperative and responds to stimulation  Airway & Oxygen Therapy: Patient Spontanous Breathing and Patient connected to face mask oxygen  Post-op Assessment: Report given to RN and Post -op Vital signs reviewed and stable  Post vital signs: Reviewed and stable  Last Vitals:  Vitals Value Taken Time  BP    Temp    Pulse    Resp    SpO2      Last Pain:  Vitals:   07/27/20 1406  TempSrc: Oral  PainSc: 3       Patients Stated Pain Goal: 5 (07/27/20 1406)  Complications: No complications documented.

## 2020-07-27 NOTE — Op Note (Signed)
07/27/2020  4:50 PM  PATIENT:  Kristine Bowman  65 y.o. female  PRE-OPERATIVE DIAGNOSIS:  right calcaneus fracture  POST-OPERATIVE DIAGNOSIS:  right tongue type calcaneus fracture  Procedure(s): 1.  Open treatment of right calcaneus fracture with internal fixation   2.  Lateral and Harris heel radiographs of the right foot  SURGEON:  Toni Arthurs, MD  ASSISTANT: None  ANESTHESIA:   General, regional  EBL:  minimal   TOURNIQUET: Approximately 50 minutes at 250 mmHg thigh   COMPLICATIONS:  None apparent  DISPOSITION:  Extubated, awake and stable to recovery.  INDICATION FOR PROCEDURE: The patient is a 65 year old female without significant past medical history.  She was visiting [place] over the weekend when she stepped off a curb and injured her right ankle.  She went to the local emergency room where radiographs revealed a displaced tongue type calcaneus fracture.  She was splinted and presents now for operative treatment of this displaced calcaneus fracture.  The risks and benefits of the alternative treatment options have been discussed in detail.  The patient wishes to proceed with surgery and specifically understands risks of bleeding, infection, nerve damage, blood clots, need for additional surgery, amputation and death.  PROCEDURE IN DETAIL: After preoperative consent was obtained and the correct operative site was identified, the patient was brought the operating room supine on a stretcher.  General anesthesia was induced.  Preoperative antibiotics were administered.  Surgical timeout was taken.  The right lower extremity was exsanguinated and a thigh tourniquet inflated to 250 mmHg.  Care was taken to protect the skin overlying the palpable bony prominence at the posterior ankle.  The patient was then turned into the prone position on the operating table with all bony prominences padded well.  The right lower extremity was prepped and draped in standard sterile fashion.   Lateral radiograph was obtained.  This showed displacement of the tongue type calcaneus fracture.  An attempt was made at closed reduction with a stab incision at the plantar heel and the posterior lateral ankle.  A tenaculum was placed over the fracture site.  Adequate compression at the fracture site could not be achieved with this technique.  A midline incision was then made over the posterior heel at the fracture site.  Dissection was carried down through the subcutaneous tissues and peritenon creating full-thickness flaps medially and laterally.  Congealed hematoma was removed with a curette, and the fracture site was irrigated copiously.  The fracture was then reduced under direct vision and held with the Weber tenaculum.  A lateral radiograph confirmed appropriate reduction of the fracture.  A guidepin for the [employer] titanium 6.5 mm cannulated screws was selected.  It was advanced across the fracture site from the Haglund deformity to the plantar surface of the calcaneus.  Radiographs confirmed appropriate position of the guidepin.  The guidepin was overdrilled.  A 6.5 mm partially-threaded screw with a washer was inserted over the guidepin.  It was tightened securely and was noted to compress the fracture site appropriately.  A second guidepin was placed medial to the Achilles across the fracture site to the plantar surface of the calcaneus.  This was overdrilled and a fully threaded 6.5 millimeter screw with a washer was inserted.  It was noted to have excellent purchase.  The lag screw was then removed from the lateral side of the Achilles and replaced with a fully threaded screw again with a washer.  Lateral and Harris heel radiographs confirmed appropriate reduction of  the fracture in appropriate position and length of both screws.  The wound was irrigated copiously and sprinkled with vancomycin powder.  The peritenon was repaired with inverted simple sutures of 2-0 Vicryl.  The skin incision was  closed with horizontal mattress sutures of 3-0 nylon.  Stab incisions were closed with simple sutures.  Sterile dressings were applied followed by a well-padded short leg splint with the ankle in gravity equinus.  The patient was awakened from anesthesia and transported to the recovery room in stable condition.  The tourniquet had been released after application of the dressings.   FOLLOW UP PLAN: Nonweightbearing on the right lower extremity.  Follow-up in the office in 2 weeks for suture removal and conversion to a short leg cast with the ankle in neutral position.  Aspirin for DVT prophylaxis.   RADIOGRAPHS: Lateral and Harris heel radiographs of the right foot are obtained intraoperatively.  These show interval reduction and fixation of the tongue type calcaneus fracture.  Hardware is appropriately positioned and of the appropriate lengths.  No other acute injuries are noted.

## 2020-07-27 NOTE — Anesthesia Procedure Notes (Signed)
Procedure Name: Intubation Date/Time: 07/27/2020 3:45 PM Performed by: Thornell Mule, CRNA Pre-anesthesia Checklist: Patient identified, Emergency Drugs available, Suction available and Patient being monitored Patient Re-evaluated:Patient Re-evaluated prior to induction Oxygen Delivery Method: Circle system utilized Preoxygenation: Pre-oxygenation with 100% oxygen Induction Type: IV induction Ventilation: Mask ventilation without difficulty Laryngoscope Size: Miller and 3 Grade View: Grade I Tube type: Oral Tube size: 7.0 mm Number of attempts: 1 Airway Equipment and Method: Stylet and Oral airway Placement Confirmation: ETT inserted through vocal cords under direct vision,  positive ETCO2 and breath sounds checked- equal and bilateral Secured at: 21 cm Tube secured with: Tape Dental Injury: Teeth and Oropharynx as per pre-operative assessment

## 2020-07-28 NOTE — Anesthesia Postprocedure Evaluation (Signed)
Anesthesia Post Note  Patient: Kristine Bowman  Procedure(s) Performed: OPEN REDUCTION INTERNAL FIXATION (ORIF) CALCANEOUS FRACTURE (Right Heel)     Patient location during evaluation: PACU Anesthesia Type: General and Regional Level of consciousness: awake and alert Pain management: pain level controlled Vital Signs Assessment: post-procedure vital signs reviewed and stable Respiratory status: spontaneous breathing, nonlabored ventilation, respiratory function stable and patient connected to nasal cannula oxygen Cardiovascular status: blood pressure returned to baseline and stable Postop Assessment: no apparent nausea or vomiting Anesthetic complications: no   No complications documented.  Last Vitals:  Vitals:   07/27/20 1708 07/27/20 1722  BP:  139/85  Pulse: 67 64  Resp: 15 18  Temp:  (!) 36 C  SpO2: 99%     Last Pain:  Vitals:   07/27/20 1722  TempSrc:   PainSc: 0-No pain                 Earl Lites P Macaulay Reicher

## 2020-07-31 ENCOUNTER — Encounter (HOSPITAL_BASED_OUTPATIENT_CLINIC_OR_DEPARTMENT_OTHER): Payer: Self-pay | Admitting: Orthopedic Surgery

## 2020-08-14 DIAGNOSIS — S92011D Displaced fracture of body of right calcaneus, subsequent encounter for fracture with routine healing: Secondary | ICD-10-CM | POA: Diagnosis not present

## 2020-08-14 DIAGNOSIS — Z4789 Encounter for other orthopedic aftercare: Secondary | ICD-10-CM | POA: Diagnosis not present

## 2020-09-07 ENCOUNTER — Encounter: Payer: Self-pay | Admitting: Family Medicine

## 2020-09-08 NOTE — Telephone Encounter (Signed)
LVM for patient to call back to get this appt scheduled. AM 

## 2020-09-13 ENCOUNTER — Telehealth (INDEPENDENT_AMBULATORY_CARE_PROVIDER_SITE_OTHER): Payer: PPO | Admitting: Family Medicine

## 2020-09-13 ENCOUNTER — Encounter: Payer: Self-pay | Admitting: Family Medicine

## 2020-09-13 DIAGNOSIS — R7989 Other specified abnormal findings of blood chemistry: Secondary | ICD-10-CM

## 2020-09-13 DIAGNOSIS — Z780 Asymptomatic menopausal state: Secondary | ICD-10-CM | POA: Diagnosis not present

## 2020-09-13 DIAGNOSIS — Z1211 Encounter for screening for malignant neoplasm of colon: Secondary | ICD-10-CM

## 2020-09-13 DIAGNOSIS — N183 Chronic kidney disease, stage 3 unspecified: Secondary | ICD-10-CM | POA: Insufficient documentation

## 2020-09-13 NOTE — Assessment & Plan Note (Signed)
Because of the abnormality of the fracture that it she experienced in her heel her orthopedist had recommended that we get a bone density scan.  She is actually never had 1 and she is 67 so she is due.  It looks like we had actually ordered 1 maybe last year.  We will get that scheduled downstairs
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
## 2020-09-13 NOTE — Assessment & Plan Note (Signed)
Unfortunately besides recent labs the last labs I have are from her were 5 years ago and then 9 years ago with nothing much in between it looks like she has been a little borderline 5 years ago.  But we did discuss that her BUN was quite elevated on recent labs which could indicate some mild dehydration so really encouraged her to make sure she is hydrating well.  I do not know because she was more sedentary maybe she was not drinking as much so not have to get up and go to the bathroom as frequently.  And she has been on high doses of NSAIDs we discussed stopping that for several days making sure that she is well-hydrated and going back to the lab to repeat them.  If her creatinine stays elevated then she may have mild CKD.  If it returns to normal then we will keep an eye on this and plan to recheck in 6 to 12 months

## 2020-09-15 DIAGNOSIS — Z4789 Encounter for other orthopedic aftercare: Secondary | ICD-10-CM | POA: Diagnosis not present

## 2020-09-15 DIAGNOSIS — M79671 Pain in right foot: Secondary | ICD-10-CM | POA: Diagnosis not present

## 2020-09-26 DIAGNOSIS — R7989 Other specified abnormal findings of blood chemistry: Secondary | ICD-10-CM | POA: Diagnosis not present

## 2020-09-27 ENCOUNTER — Encounter: Payer: Self-pay | Admitting: Family Medicine

## 2020-09-27 LAB — BASIC METABOLIC PANEL WITH GFR
BUN/Creatinine Ratio: 18 (calc) (ref 6–22)
BUN: 21 mg/dL (ref 7–25)
CO2: 24 mmol/L (ref 20–32)
Calcium: 9.5 mg/dL (ref 8.6–10.4)
Chloride: 104 mmol/L (ref 98–110)
Creat: 1.14 mg/dL — ABNORMAL HIGH (ref 0.50–0.99)
GFR, Est African American: 58 mL/min/{1.73_m2} — ABNORMAL LOW (ref >=60)
GFR, Est Non African American: 50 mL/min/{1.73_m2} — ABNORMAL LOW (ref >=60)
Glucose, Bld: 100 mg/dL — ABNORMAL HIGH (ref 65–99)
Potassium: 4 mmol/L (ref 3.5–5.3)
Sodium: 139 mmol/L (ref 135–146)

## 2020-10-16 DIAGNOSIS — Z4789 Encounter for other orthopedic aftercare: Secondary | ICD-10-CM | POA: Diagnosis not present

## 2020-10-16 DIAGNOSIS — S92011D Displaced fracture of body of right calcaneus, subsequent encounter for fracture with routine healing: Secondary | ICD-10-CM | POA: Diagnosis not present

## 2020-10-19 DIAGNOSIS — M25571 Pain in right ankle and joints of right foot: Secondary | ICD-10-CM | POA: Diagnosis not present

## 2020-10-19 DIAGNOSIS — R2689 Other abnormalities of gait and mobility: Secondary | ICD-10-CM | POA: Diagnosis not present

## 2020-10-25 DIAGNOSIS — M25571 Pain in right ankle and joints of right foot: Secondary | ICD-10-CM | POA: Diagnosis not present

## 2020-10-25 DIAGNOSIS — R2689 Other abnormalities of gait and mobility: Secondary | ICD-10-CM | POA: Diagnosis not present

## 2020-10-27 DIAGNOSIS — R2689 Other abnormalities of gait and mobility: Secondary | ICD-10-CM | POA: Diagnosis not present

## 2020-10-27 DIAGNOSIS — M25571 Pain in right ankle and joints of right foot: Secondary | ICD-10-CM | POA: Diagnosis not present

## 2020-11-01 DIAGNOSIS — M25571 Pain in right ankle and joints of right foot: Secondary | ICD-10-CM | POA: Diagnosis not present

## 2020-11-01 DIAGNOSIS — R2689 Other abnormalities of gait and mobility: Secondary | ICD-10-CM | POA: Diagnosis not present

## 2020-11-06 DIAGNOSIS — M25571 Pain in right ankle and joints of right foot: Secondary | ICD-10-CM | POA: Diagnosis not present

## 2020-11-06 DIAGNOSIS — R2689 Other abnormalities of gait and mobility: Secondary | ICD-10-CM | POA: Diagnosis not present

## 2020-11-08 ENCOUNTER — Ambulatory Visit (INDEPENDENT_AMBULATORY_CARE_PROVIDER_SITE_OTHER): Payer: PPO

## 2020-11-08 ENCOUNTER — Other Ambulatory Visit: Payer: Self-pay

## 2020-11-08 DIAGNOSIS — Z780 Asymptomatic menopausal state: Secondary | ICD-10-CM | POA: Diagnosis not present

## 2020-11-08 DIAGNOSIS — R2689 Other abnormalities of gait and mobility: Secondary | ICD-10-CM | POA: Diagnosis not present

## 2020-11-08 DIAGNOSIS — M25571 Pain in right ankle and joints of right foot: Secondary | ICD-10-CM | POA: Diagnosis not present

## 2020-11-08 DIAGNOSIS — M8589 Other specified disorders of bone density and structure, multiple sites: Secondary | ICD-10-CM | POA: Diagnosis not present

## 2020-11-09 ENCOUNTER — Encounter: Payer: Self-pay | Admitting: Family Medicine

## 2020-11-09 DIAGNOSIS — M8580 Other specified disorders of bone density and structure, unspecified site: Secondary | ICD-10-CM | POA: Insufficient documentation

## 2020-11-10 DIAGNOSIS — M25571 Pain in right ankle and joints of right foot: Secondary | ICD-10-CM | POA: Diagnosis not present

## 2020-11-10 DIAGNOSIS — R2689 Other abnormalities of gait and mobility: Secondary | ICD-10-CM | POA: Diagnosis not present

## 2020-11-15 DIAGNOSIS — M25571 Pain in right ankle and joints of right foot: Secondary | ICD-10-CM | POA: Diagnosis not present

## 2020-11-15 DIAGNOSIS — S92011D Displaced fracture of body of right calcaneus, subsequent encounter for fracture with routine healing: Secondary | ICD-10-CM | POA: Diagnosis not present

## 2020-11-15 DIAGNOSIS — R2689 Other abnormalities of gait and mobility: Secondary | ICD-10-CM | POA: Diagnosis not present

## 2020-11-20 DIAGNOSIS — M25571 Pain in right ankle and joints of right foot: Secondary | ICD-10-CM | POA: Diagnosis not present

## 2020-11-20 DIAGNOSIS — R2689 Other abnormalities of gait and mobility: Secondary | ICD-10-CM | POA: Diagnosis not present

## 2020-11-24 DIAGNOSIS — R2689 Other abnormalities of gait and mobility: Secondary | ICD-10-CM | POA: Diagnosis not present

## 2020-11-24 DIAGNOSIS — M25571 Pain in right ankle and joints of right foot: Secondary | ICD-10-CM | POA: Diagnosis not present

## 2020-11-28 DIAGNOSIS — R2689 Other abnormalities of gait and mobility: Secondary | ICD-10-CM | POA: Diagnosis not present

## 2020-11-28 DIAGNOSIS — M25571 Pain in right ankle and joints of right foot: Secondary | ICD-10-CM | POA: Diagnosis not present

## 2020-12-06 DIAGNOSIS — M25571 Pain in right ankle and joints of right foot: Secondary | ICD-10-CM | POA: Diagnosis not present

## 2020-12-06 DIAGNOSIS — R2689 Other abnormalities of gait and mobility: Secondary | ICD-10-CM | POA: Diagnosis not present

## 2020-12-11 DIAGNOSIS — M25571 Pain in right ankle and joints of right foot: Secondary | ICD-10-CM | POA: Diagnosis not present

## 2020-12-11 DIAGNOSIS — R2689 Other abnormalities of gait and mobility: Secondary | ICD-10-CM | POA: Diagnosis not present

## 2020-12-20 DIAGNOSIS — R2689 Other abnormalities of gait and mobility: Secondary | ICD-10-CM | POA: Diagnosis not present

## 2020-12-20 DIAGNOSIS — M25571 Pain in right ankle and joints of right foot: Secondary | ICD-10-CM | POA: Diagnosis not present

## 2020-12-27 DIAGNOSIS — R2689 Other abnormalities of gait and mobility: Secondary | ICD-10-CM | POA: Diagnosis not present

## 2020-12-27 DIAGNOSIS — M25571 Pain in right ankle and joints of right foot: Secondary | ICD-10-CM | POA: Diagnosis not present

## 2021-01-15 DIAGNOSIS — M25571 Pain in right ankle and joints of right foot: Secondary | ICD-10-CM | POA: Diagnosis not present

## 2021-01-15 DIAGNOSIS — R2689 Other abnormalities of gait and mobility: Secondary | ICD-10-CM | POA: Diagnosis not present

## 2021-03-30 ENCOUNTER — Other Ambulatory Visit: Payer: Self-pay

## 2021-03-30 ENCOUNTER — Ambulatory Visit (INDEPENDENT_AMBULATORY_CARE_PROVIDER_SITE_OTHER): Payer: PPO | Admitting: Physician Assistant

## 2021-03-30 ENCOUNTER — Encounter: Payer: Self-pay | Admitting: Physician Assistant

## 2021-03-30 VITALS — BP 147/88 | HR 65 | Temp 97.5°F | Ht 67.0 in | Wt 199.0 lb

## 2021-03-30 DIAGNOSIS — R32 Unspecified urinary incontinence: Secondary | ICD-10-CM

## 2021-03-30 DIAGNOSIS — Z1329 Encounter for screening for other suspected endocrine disorder: Secondary | ICD-10-CM | POA: Diagnosis not present

## 2021-03-30 DIAGNOSIS — Z131 Encounter for screening for diabetes mellitus: Secondary | ICD-10-CM | POA: Diagnosis not present

## 2021-03-30 DIAGNOSIS — Z13220 Encounter for screening for lipoid disorders: Secondary | ICD-10-CM

## 2021-03-30 DIAGNOSIS — R300 Dysuria: Secondary | ICD-10-CM

## 2021-03-30 DIAGNOSIS — R3915 Urgency of urination: Secondary | ICD-10-CM

## 2021-03-30 DIAGNOSIS — N3001 Acute cystitis with hematuria: Secondary | ICD-10-CM | POA: Diagnosis not present

## 2021-03-30 LAB — POCT URINALYSIS DIP (CLINITEK)
Bilirubin, UA: NEGATIVE
Glucose, UA: NEGATIVE mg/dL
Ketones, POC UA: NEGATIVE mg/dL
Nitrite, UA: NEGATIVE
POC PROTEIN,UA: NEGATIVE
Spec Grav, UA: 1.01 (ref 1.010–1.025)
Urobilinogen, UA: 0.2 E.U./dL
pH, UA: 5.5 (ref 5.0–8.0)

## 2021-03-30 MED ORDER — NITROFURANTOIN MONOHYD MACRO 100 MG PO CAPS: 100.0000 mg | ORAL_CAPSULE | Freq: Two times a day (BID) | ORAL | 0 refills | Status: DC

## 2021-03-30 NOTE — Patient Instructions (Signed)
Urinary Tract Infection, Adult A urinary tract infection (UTI) is an infection of any part of the urinary tract. The urinary tract includes the kidneys, ureters, bladder, and urethra. These organs make, store, and get rid of urine in the body. An upper UTI affects the ureters and kidneys. A lower UTI affects the bladder and urethra. What are the causes? Most urinary tract infections are caused by bacteria in your genital area around your urethra, where urine leaves your body. These bacteria grow and cause inflammation of your urinary tract. What increases the risk? You are more likely to develop this condition if: You have a urinary catheter that stays in place. You are not able to control when you urinate or have a bowel movement (incontinence). You are female and you: Use a spermicide or diaphragm for birth control. Have low estrogen levels. Are pregnant. You have certain genes that increase your risk. You are sexually active. You take antibiotic medicines. You have a condition that causes your flow of urine to slow down, such as: An enlarged prostate, if you are female. Blockage in your urethra. A kidney stone. A nerve condition that affects your bladder control (neurogenic bladder). Not getting enough to drink, or not urinating often. You have certain medical conditions, such as: Diabetes. A weak disease-fighting system (immunesystem). Sickle cell disease. Gout. Spinal cord injury. What are the signs or symptoms? Symptoms of this condition include: Needing to urinate right away (urgency). Frequent urination. This may include small amounts of urine each time you urinate. Pain or burning with urination. Blood in the urine. Urine that smells bad or unusual. Trouble urinating. Cloudy urine. Vaginal discharge, if you are female. Pain in the abdomen or the lower back. You may also have: Vomiting or a decreased appetite. Confusion. Irritability or tiredness. A fever or  chills. Diarrhea. The first symptom in older adults may be confusion. In some cases, they may not have any symptoms until the infection has worsened. How is this diagnosed? This condition is diagnosed based on your medical history and a physical exam. You may also have other tests, including: Urine tests. Blood tests. Tests for STIs (sexually transmitted infections). If you have had more than one UTI, a cystoscopy or imaging studies may be done to determine the cause of the infections. How is this treated? Treatment for this condition includes: Antibiotic medicine. Over-the-counter medicines to treat discomfort. Drinking enough water to stay hydrated. If you have frequent infections or have other conditions such as a kidney stone, you may need to see a health care provider who specializes in the urinary tract (urologist). In rare cases, urinary tract infections can cause sepsis. Sepsis is a life-threatening condition that occurs when the body responds to an infection. Sepsis is treated in the hospital with IV antibiotics, fluids, and other medicines. Follow these instructions at home: Medicines Take over-the-counter and prescription medicines only as told by your health care provider. If you were prescribed an antibiotic medicine, take it as told by your health care provider. Do not stop using the antibiotic even if you start to feel better. General instructions Make sure you: Empty your bladder often and completely. Do not hold urine for long periods of time. Empty your bladder after sex. Wipe from front to back after urinating or having a bowel movement if you are female. Use each tissue only one time when you wipe. Drink enough fluid to keep your urine pale yellow. Keep all follow-up visits. This is important. Contact a health care provider   if: Your symptoms do not get better after 1-2 days. Your symptoms go away and then return. Get help right away if: You have severe pain in your  back or your lower abdomen. You have a fever or chills. You have nausea or vomiting. Summary A urinary tract infection (UTI) is an infection of any part of the urinary tract, which includes the kidneys, ureters, bladder, and urethra. Most urinary tract infections are caused by bacteria in your genital area. Treatment for this condition often includes antibiotic medicines. If you were prescribed an antibiotic medicine, take it as told by your health care provider. Do not stop using the antibiotic even if you start to feel better. Keep all follow-up visits. This is important. This information is not intended to replace advice given to you by your health care provider. Make sure you discuss any questions you have with your health care provider. Document Revised: 11/26/2019 Document Reviewed: 11/26/2019 Elsevier Patient Education  2022 [employer]  

## 2021-03-30 NOTE — Progress Notes (Signed)
Subjective:    Patient ID: Kristine Bowman, female    DOB: 08-22-1955, 65 y.o.   MRN: [id number]  HPI Pt is a 65 yo female who presents to the clinic with urinary symptoms of urgency, leakage, frequency, dysuria that started earlier this week.   Denies any fever, chills, flank pain, abdominal pain. No hx of recurrent UTI.   She does wish to have labs ordered for screening.   .. 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     Objective:   Physical Exam Vitals reviewed.  Constitutional:      Appearance: Normal appearance.  HENT:     Head: Normocephalic.  Cardiovascular:     Rate and Rhythm: Normal rate and regular rhythm.     Pulses: Normal pulses.     Heart sounds: Normal heart sounds.  Pulmonary:     Effort: Pulmonary effort is normal.     Breath sounds: Normal breath sounds.  Abdominal:     General: Bowel sounds are normal. There is no distension.     Palpations: Abdomen is soft. There is no mass.     Tenderness: There is no abdominal tenderness. There is no right CVA tenderness, left CVA tenderness, guarding or rebound.  Neurological:     General: No focal deficit present.     Mental Status: She is alert and oriented to person, place, and time.  Psychiatric:        Mood and Affect: Mood normal.          .. Results for orders placed or performed in visit on 03/30/21  Urine Culture   Specimen: Urine  Result Value Ref  Range   MICRO NUMBER: [id number]    SPECIMEN QUALITY: Adequate    Sample Source NOT GIVEN    STATUS: FINAL    Result:      Mixed genital flora isolated. These superficial bacteria are not indicative of a urinary tract infection. No further organism identification is warranted on this specimen. If clinically indicated, recollect clean-catch, mid-stream urine and transfer  immediately to Urine Culture Transport Tube.   POCT URINALYSIS DIP (CLINITEK)  Result Value Ref Range   Color, UA yellow yellow   Clarity, UA clear clear   Glucose, UA negative negative mg/dL   Bilirubin, UA negative negative   Ketones, POC UA negative negative mg/dL   Spec Grav, UA 4.008 6.761 - 1.025   Blood, UA small (A) negative   pH, UA 5.5 5.0 - 8.0   POC PROTEIN,UA negative negative, trace   Urobilinogen, UA 0.2 0.2 or 1.0 E.U./dL   Nitrite, UA Negative Negative   Leukocytes, UA Small (1+) (A) Negative    Assessment & Plan:  Marland KitchenMarland KitchenChera was seen today for dysuria.  Diagnoses and all orders for this visit:  Acute cystitis with hematuria -     nitrofurantoin, macrocrystal-monohydrate, (MACROBID) 100 MG capsule; Take 1 capsule (100 mg  total) by mouth 2 (two) times daily.  Dysuria -     POCT URINALYSIS DIP (CLINITEK) -     Urine Culture -     nitrofurantoin, macrocrystal-monohydrate, (MACROBID) 100 MG capsule; Take 1 capsule (100 mg total) by mouth 2 (two) times daily. -     CBC  Urinary incontinence, unspecified type  Urinary urgency  Screening for diabetes mellitus -     COMPLETE METABOLIC PANEL WITH GFR -     CBC  Screening for lipid disorders -     Lipid Panel w/reflex Direct LDL -     CBC  Thyroid disorder screen -     TSH -     CBC  UA positive for blood and leukocytes.  Will empircally treat with macrobid.  Will culture.  Discussed symptomatic care.  No red flags signs or symptoms.  Screening labs ordered to get when fasting.  Follow up as needed or with worsening symptoms.

## 2021-04-01 LAB — URINE CULTURE
MICRO NUMBER:: 12709997
SPECIMEN QUALITY:: ADEQUATE

## 2021-04-02 ENCOUNTER — Encounter: Payer: Self-pay | Admitting: Physician Assistant

## 2021-04-02 NOTE — Progress Notes (Signed)
Urine culture showed more superficial bacteria that are not indicative of infection. How are your symptoms today?  Based on culture you could stop antibiotic.

## 2021-04-03 DIAGNOSIS — Z13220 Encounter for screening for lipoid disorders: Secondary | ICD-10-CM | POA: Diagnosis not present

## 2021-04-03 DIAGNOSIS — Z1329 Encounter for screening for other suspected endocrine disorder: Secondary | ICD-10-CM | POA: Diagnosis not present

## 2021-04-03 DIAGNOSIS — Z131 Encounter for screening for diabetes mellitus: Secondary | ICD-10-CM | POA: Diagnosis not present

## 2021-04-03 DIAGNOSIS — Z0000 Encounter for general adult medical examination without abnormal findings: Secondary | ICD-10-CM | POA: Diagnosis not present

## 2021-04-03 DIAGNOSIS — E785 Hyperlipidemia, unspecified: Secondary | ICD-10-CM | POA: Diagnosis not present

## 2021-04-03 DIAGNOSIS — R300 Dysuria: Secondary | ICD-10-CM | POA: Diagnosis not present

## 2021-04-03 LAB — CBC
HCT: 38.8 % (ref 35.0–45.0)
Hemoglobin: 13.1 g/dL (ref 11.7–15.5)
MCH: 31 pg (ref 27.0–33.0)
MCHC: 33.8 g/dL (ref 32.0–36.0)
MCV: 91.7 fL (ref 80.0–100.0)
MPV: 10.8 fL (ref 7.5–12.5)
Platelets: 235 10*3/uL (ref 140–400)
RBC: 4.23 10*6/uL (ref 3.80–5.10)
RDW: 12 % (ref 11.0–15.0)
WBC: 5.8 10*3/uL (ref 3.8–10.8)

## 2021-04-03 LAB — COMPLETE METABOLIC PANEL WITH GFR
AG Ratio: 1.8 (calc) (ref 1.0–2.5)
ALT: 18 U/L (ref 6–29)
AST: 14 U/L (ref 10–35)
Albumin: 4.4 g/dL (ref 3.6–5.1)
Alkaline phosphatase (APISO): 57 U/L (ref 37–153)
BUN/Creatinine Ratio: 18 (calc) (ref 6–22)
BUN: 22 mg/dL (ref 7–25)
CO2: 26 mmol/L (ref 20–32)
Calcium: 9.6 mg/dL (ref 8.6–10.4)
Chloride: 104 mmol/L (ref 98–110)
Creat: 1.21 mg/dL — ABNORMAL HIGH (ref 0.50–1.05)
Globulin: 2.5 g/dL (calc) (ref 1.9–3.7)
Glucose, Bld: 90 mg/dL (ref 65–99)
Potassium: 4.5 mmol/L (ref 3.5–5.3)
Sodium: 137 mmol/L (ref 135–146)
Total Bilirubin: 0.7 mg/dL (ref 0.2–1.2)
Total Protein: 6.9 g/dL (ref 6.1–8.1)
eGFR: 49 mL/min/{1.73_m2} — ABNORMAL LOW (ref >=60)

## 2021-04-03 LAB — LIPID PANEL W/REFLEX DIRECT LDL
Cholesterol: 171 mg/dL (ref <200)
HDL: 57 mg/dL (ref >=50)
LDL Cholesterol (Calc): 93 mg/dL (calc)
Non-HDL Cholesterol (Calc): 114 mg/dL (calc) (ref <130)
Total CHOL/HDL Ratio: 3 (calc) (ref <5.0)
Triglycerides: 113 mg/dL (ref <150)

## 2021-04-03 LAB — TSH: TSH: 1.54 mIU/L (ref 0.40–4.50)

## 2021-04-04 ENCOUNTER — Other Ambulatory Visit: Payer: Self-pay | Admitting: Physician Assistant

## 2021-04-04 NOTE — Progress Notes (Signed)
Loreena,   Normal WBC.  Normal hemoglobin.  Thyroid looks great.  HDL, good cholesterol, looks amazing. LDL, bad cholesterol, looks so much better than 1 year ago.  Glucose and liver look great.  Kidney function continues to decline from 6 months ago. GFR is 49.  Stop and anti-inflammatories as over time they can worsen kidney function. I would consider talking to Dr. Linford Arnold about a medication to improve and at the least halt kidney function decline, [place].   How are your urinary symptoms today?

## 2021-04-06 ENCOUNTER — Telehealth (INDEPENDENT_AMBULATORY_CARE_PROVIDER_SITE_OTHER): Payer: PPO | Admitting: Physician Assistant

## 2021-04-06 ENCOUNTER — Encounter: Payer: Self-pay | Admitting: Physician Assistant

## 2021-04-06 DIAGNOSIS — N898 Other specified noninflammatory disorders of vagina: Secondary | ICD-10-CM | POA: Diagnosis not present

## 2021-04-06 DIAGNOSIS — N1832 Chronic kidney disease, stage 3b: Secondary | ICD-10-CM | POA: Diagnosis not present

## 2021-04-06 DIAGNOSIS — R319 Hematuria, unspecified: Secondary | ICD-10-CM | POA: Diagnosis not present

## 2021-04-06 NOTE — Progress Notes (Signed)
..  Virtual Visit via Video Note  I connected with [name] on 04/06/21 at 10:10 AM EST by a video enabled telemedicine application and verified that I am speaking with the correct person using two identifiers.  Location: Patient: home Provider: clinic  .Marland KitchenParticipating in visit:  Patient: Kristine Bowman Provider: Tandy Gaw PA-C Provider in training: Zachery Conch PA-S   I discussed the limitations of evaluation and management by telemedicine and the availability of in person appointments. The patient expressed understanding and agreed to proceed.  History of Present Illness: Pt is a 65 yo female who presents to the clinic via video to discuss labs.   Pt did finish her abx despite urine culture not showing infection. Most of her symptoms of urine odor and abdominal pain, dysuria have cleared. She continues to be externally irritated with some redness but not discharge or itching or odor.  Results of urine culture below.   Mixed genital flora isolated. These superficial bacteria are not indicative of a urinary tract infection. No further organism identification is warranted on this specimen. If clinically indicated, recollect clean-catch, mid-stream urine and transfer   She is concerned about GFR of 49.   .. 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   CKD (chronic kidney disease) stage 3, GFR 30-59 ml/min (HCC) 09/13/2020   Post-menopausal 09/13/2020   Osteopenia 11/09/2020   Hematuria 04/06/2021   Vaginal irritation 04/06/2021   Resolved Ambulatory Problems    Diagnosis Date Noted   BREAST PAIN, LEFT 05/11/2010   Vaginal discharge 12/03/2011   Tibialis posterior tendinitis 12/31/2012   Ingrown right big toenail 05/10/2013   Past Medical History:  Diagnosis Date   Right calcaneal fracture       Observations/Objective: No acute distress Normal  breathing Normal mood and appearance  .Marland KitchenThere were no vitals filed for this visit. There is no height or weight on file to calculate BMI.    Assessment and Plan: Marland KitchenMarland KitchenDiagnoses and all orders for this visit:  Stage 3b chronic kidney disease (HCC) -     BASIC METABOLIC PANEL WITH GFR -     US RENAL; Future -     Urinalysis w microscopic + reflex cultur  Hematuria, unspecified type -     BASIC METABOLIC PANEL WITH GFR -     US RENAL; Future -     Urinalysis w microscopic + reflex cultur  Vaginal irritation  Reassured patient no infection found in culture her symptoms were likely due to inflammation in bladder and urethra.  Discussed declining kidney function. GFR 49.  Will recheck in 2 weeks.  Will order renal ultrasound. Hematuria to be followed up on with repeat urine.  Discussed low protein, potassium, sodium, phosphorus diet.  No NSAIDS.  Discussed coconut oil for vaginal irritation.  Follow up as needed or if symptoms worsen.    Follow Up Instructions:    I discussed the assessment and treatment plan with the patient. The patient was provided an opportunity to ask questions and all were answered. The patient agreed with the plan and demonstrated an understanding of the instructions.   The patient was advised to call back or seek an in-person evaluation if the symptoms worsen or if the condition fails to improve as anticipated.    Tandy Gaw, PA-C

## 2021-04-06 NOTE — Patient Instructions (Signed)
Food Basics for Chronic Kidney Disease Chronic kidney disease (CKD) occurs when the kidneys are permanently damaged over a long period of time. When your kidneys are not working well, they cannot remove waste, fluids, and other substances from your blood as well as they did before. The substances can build up, which can worsen kidney damage and affect how your body functions. Certain foods lead to a buildup of these substances. By changing your diet, you can help prevent more kidney damage and delay or prevent the need for dialysis. What are tips for following this plan? Reading food labels Check the amount of salt (sodium) in foods. Choose foods that have less than 300 milligrams (mg) per serving. Check the ingredient list for phosphorus or potassium-based additives or preservatives. Check the amount of saturated fat and trans fat. Limit or avoid these fats as told by your dietitian. Shopping Avoid buying foods that are: Processed or prepackaged. Calcium-enriched or that have calcium added to them (are fortified). Do not buy foods that have salt or sodium listed among the first five ingredients. Buy canned vegetables and beans that say "no salt added" or "low sodium" and rinse them before eating. Cooking Soak vegetables, such as potatoes, before cooking to reduce potassium. To do this: Peel and cut the vegetables into small pieces. Soak the vegetables in warm water for at least 2 hours. For every 1 cup of vegetables, use 10 cups of water. Drain and rinse the vegetables with warm water. Boil the vegetables for at least 5 minutes. Meal planning Limit the amount of protein you eat from plant and animal sources each day. Do not add salt to food when cooking or before eating. Eat meals and snacks at around the same time each day. General information Talk with your health care provider about whether you should take a vitamin and mineral supplement. Use standard measuring cups and spoons to  measure servings of foods. Use a kitchen scale to measure portions of protein foods. If told by your health care provider, avoid drinking too much fluid. Measure and count all liquids, including water, ice, soups, flavored gelatin, and frozen desserts such as ice pops or ice cream. If you have diabetes: If you have diabetes (diabetes mellitus) and CKD, it is important to keep your blood sugar (glucose) in the target range recommended by your health care provider. Follow your diabetes management plan. This may include: Checking your blood glucose regularly. Taking medicines by mouth, taking insulin, or taking both. Exercising for at least 30 minutes on 5 or more days each week, or as told by your health care provider. Tracking how many servings of carbohydrates you eat at each meal. You may be given specific guidelines on how much of certain foods and nutrients you may eat, depending on your stage of kidney disease and whether you have high blood pressure (hypertension). Follow your meal plan as told by your dietitian. What nutrients should I limit? Work with your health care provider and dietitian to develop a meal plan that is right for you. Foods you can eat and foods you should limit or avoid will depend on the stage of your kidney disease and any other health conditions you have. The items listed below are not a complete list. Talk with your dietitian about what dietary choices are best for you. Potassium Potassium affects how steadily your heart beats. If too much potassium builds up in your blood, the potassium can cause an irregular heartbeat or even a heart attack. You   may need to limit or avoid foods that are high in potassium, such as: Milk and soy milk. Fruits, such as bananas, apricots, nectarines, melon, prunes, raisins, kiwi, and oranges. Vegetables, such as potatoes, sweet potatoes, yams, tomatoes, leafy greens, beets, avocado, pumpkin, and winter squash. White and lima  beans. Whole-wheat breads and pastas. Beans and nuts. Phosphorus Phosphorus is a mineral found in your bones. A balance between calcium and phosphorus is needed to build and maintain healthy bones. Too much phosphorus pulls calcium from your bones. This can make your bones weak and more likely to break. Too much phosphorus can also make your skin itch. You may need to limit or avoid foods that are high in phosphorus, such as: Milk and dairy products. Dried beans and peas. Tofu, soy milk, and other soy-based meat replacements. Dark-colored sodas. Nuts and peanut butter. Meat, poultry, and fish. Bran cereals and oatmeal. Protein Protein helps you make and keep muscle. It also helps to repair your body's cells and tissues. One of the natural breakdown products of protein is a waste product called urea. When your kidneys are not working properly, they cannot remove wastes, such as urea. Reducing how much protein you eat can help prevent a buildup of urea in your blood. Depending on your stage of kidney disease, you may need to limit foods that are high in protein. Sources of animal protein include: Meat (all types). Fish and seafood. Poultry. Eggs. Dairy. Other protein foods include: Beans and legumes. Nuts and nut butter. Soy and tofu.  Sodium Sodium helps to maintain a healthy balance of fluids in your body. Too much sodium can increase your blood pressure and have a negative effect on your heart and lungs. Too much sodium can also cause your body to retain too much fluid, making your kidneys work harder. Most people should have less than 2,300 mg of sodium each day. If you have hypertension, you may need to limit your sodium to 1,500 mg each day. You may need to limit or avoid foods that are high in sodium, such as: Salt seasonings. Soy sauce. Cured and processed meats. Salted crackers and snack foods. Fast food. Canned soups and most canned foods. Pickled foods. Vegetable  juice. Boxed mixes or ready-to-eat boxed meals and side dishes. Bottled dressings, sauces, and marinades. Talk with your dietitian about how much potassium, phosphorus, protein, and sodium you may have each day. Summary Chronic kidney disease (CKD) can lead to a buildup of waste and extra substances in the body. Certain foods lead to a buildup of these substances. By changing your diet as told, you can help prevent more kidney damage and delay or prevent the need for dialysis. Food intake changes are different for each person with CKD. Work with a dietitian to set up nutrient goals and a meal plan that is right for you. If you have diabetes and CKD, it is important to keep your blood sugar in the target range recommended by your health care provider. This information is not intended to replace advice given to you by your health care provider. Make sure you discuss any questions you have with your health care provider. Document Revised: 08/09/2019 Document Reviewed: 08/09/2019 Elsevier Patient Education  2022 [employer]  

## 2021-04-09 ENCOUNTER — Other Ambulatory Visit: Payer: Self-pay

## 2021-04-09 ENCOUNTER — Ambulatory Visit (INDEPENDENT_AMBULATORY_CARE_PROVIDER_SITE_OTHER): Payer: PPO

## 2021-04-09 DIAGNOSIS — R944 Abnormal results of kidney function studies: Secondary | ICD-10-CM | POA: Diagnosis not present

## 2021-04-09 DIAGNOSIS — R319 Hematuria, unspecified: Secondary | ICD-10-CM

## 2021-04-09 DIAGNOSIS — N1832 Chronic kidney disease, stage 3b: Secondary | ICD-10-CM

## 2021-04-10 ENCOUNTER — Ambulatory Visit (INDEPENDENT_AMBULATORY_CARE_PROVIDER_SITE_OTHER): Payer: PPO | Admitting: Physician Assistant

## 2021-04-10 ENCOUNTER — Telehealth (INDEPENDENT_AMBULATORY_CARE_PROVIDER_SITE_OTHER): Payer: PPO | Admitting: Family Medicine

## 2021-04-10 ENCOUNTER — Telehealth (HOSPITAL_BASED_OUTPATIENT_CLINIC_OR_DEPARTMENT_OTHER): Payer: Self-pay | Admitting: Family Medicine

## 2021-04-10 ENCOUNTER — Encounter: Payer: Self-pay | Admitting: Physician Assistant

## 2021-04-10 VITALS — BP 143/86 | HR 95 | Temp 98.0°F | Ht 67.0 in | Wt 196.0 lb

## 2021-04-10 VITALS — BP 143/86 | HR 95 | Temp 98.0°F | Ht 65.0 in | Wt 196.0 lb

## 2021-04-10 DIAGNOSIS — R1013 Epigastric pain: Secondary | ICD-10-CM | POA: Insufficient documentation

## 2021-04-10 DIAGNOSIS — M545 Low back pain, unspecified: Secondary | ICD-10-CM | POA: Insufficient documentation

## 2021-04-10 DIAGNOSIS — R1031 Right lower quadrant pain: Secondary | ICD-10-CM | POA: Diagnosis not present

## 2021-04-10 DIAGNOSIS — R0789 Other chest pain: Secondary | ICD-10-CM | POA: Diagnosis not present

## 2021-04-10 DIAGNOSIS — N1832 Chronic kidney disease, stage 3b: Secondary | ICD-10-CM

## 2021-04-10 DIAGNOSIS — R350 Frequency of micturition: Secondary | ICD-10-CM | POA: Insufficient documentation

## 2021-04-10 DIAGNOSIS — R1032 Left lower quadrant pain: Secondary | ICD-10-CM | POA: Diagnosis not present

## 2021-04-10 DIAGNOSIS — R300 Dysuria: Secondary | ICD-10-CM

## 2021-04-10 DIAGNOSIS — R82998 Other abnormal findings in urine: Secondary | ICD-10-CM | POA: Diagnosis not present

## 2021-04-10 LAB — POCT URINALYSIS DIP (CLINITEK)
Bilirubin, UA: NEGATIVE
Blood, UA: NEGATIVE
Glucose, UA: NEGATIVE mg/dL
Ketones, POC UA: NEGATIVE mg/dL
Nitrite, UA: NEGATIVE
POC PROTEIN,UA: NEGATIVE
Spec Grav, UA: 1.02 (ref 1.010–1.025)
Urobilinogen, UA: 0.2 E.U./dL
pH, UA: 5.5 (ref 5.0–8.0)

## 2021-04-10 NOTE — Patient Instructions (Signed)
Get labs

## 2021-04-10 NOTE — Progress Notes (Signed)
Pt presents for EKG and POCT urine after video visit with PCP.

## 2021-04-10 NOTE — Progress Notes (Signed)
Please culture.

## 2021-04-10 NOTE — Progress Notes (Signed)
Subjective:    Patient ID: Kristine Bowman, female    DOB: [birth year], 65 y.o.   MRN: [id number]  HPI Pt is a 65 yo female with CKD 3 who presents to the clinic with worsening dysuria, urinary frequency, lower abdominal pain, low back pain for a few days. She is also c/o epigastric pain/fullness/pressure. She feels like she could have indigestion but never had that before. No alcohol intake and does not take any medications or NSAIDs. Her chest pain radiates across left side of chest but does not worsen with exertion. No extremity edema.   She started with virtual visit but asked patient to come in for labs, urine and EKG.   .. 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   CKD (chronic kidney disease) stage 3, GFR 30-59 ml/min (HCC) 09/13/2020   Post-menopausal 09/13/2020   Osteopenia 11/09/2020   Hematuria 04/06/2021   Vaginal irritation 04/06/2021   Acute bilateral low back pain without sciatica 04/10/2021   Bilateral lower abdominal pain 04/10/2021   Atypical chest pain 04/10/2021   Epigastric pain 04/10/2021   Leukocytes in urine 04/10/2021   Dysuria 04/10/2021   Frequent urination 04/10/2021   Resolved Ambulatory Problems    Diagnosis Date Noted   BREAST PAIN, LEFT 05/11/2010   Vaginal discharge 12/03/2011   Tibialis posterior tendinitis 12/31/2012   Ingrown right big toenail 05/10/2013   Past Medical History:  Diagnosis Date   Right calcaneal fracture        Review of Systems See HPI.     Objective:   Physical Exam Vitals reviewed.  Constitutional:      Appearance: Normal appearance. She is obese.  HENT:     Head: Normocephalic.  Cardiovascular:     Rate and Rhythm: Normal rate and regular rhythm.     Pulses: Normal pulses.  Pulmonary:     Effort: Pulmonary effort is normal.  Abdominal:     General: Bowel sounds are normal. There is no  distension.     Palpations: Abdomen is soft. There is no mass.     Tenderness: There is no abdominal tenderness. There is no right CVA tenderness, left CVA tenderness, guarding or rebound.     Hernia: No hernia is present.  Musculoskeletal:     Right lower leg: No edema.     Left lower leg: No edema.  Neurological:     General: No focal deficit present.     Mental Status: She is alert and oriented to person, place, and time.  Psychiatric:        Mood and Affect: Mood normal.         .. Results for orders placed or performed in visit on 03/30/21  Urine Culture   Specimen: Urine  Result Value Ref Range   MICRO NUMBER: [id number]    SPECIMEN QUALITY: Adequate    Sample Source NOT GIVEN    STATUS: FINAL    Result:      Mixed genital flora isolated. These superficial bacteria are not indicative of a urinary tract infection. No further organism identification is warranted on this specimen. If clinically indicated, recollect clean-catch, mid-stream urine and transfer  immediately to Urine Culture Transport Tube.   COMPLETE METABOLIC PANEL WITH GFR  Result Value Ref Range   Glucose, Bld 90 65 - 99 mg/dL   BUN 22 7 - 25 mg/dL  Creat 1.21 (H) 0.50 - 1.05 mg/dL   eGFR 49 (L) > OR = 60 mL/min/1.87m   BUN/Creatinine Ratio 18 6 - 22 (calc)   Sodium 137 135 - 146 mmol/L   Potassium 4.5 3.5 - 5.3 mmol/L   Chloride 104 98 - 110 mmol/L   CO2 26 20 - 32 mmol/L   Calcium 9.6 8.6 - 10.4 mg/dL   Total Protein 6.9 6.1 - 8.1 g/dL   Albumin 4.4 3.6 - 5.1 g/dL   Globulin 2.5 1.9 - 3.7 g/dL (calc)   AG Ratio 1.8 1.0 - 2.5 (calc)   Total Bilirubin 0.7 0.2 - 1.2 mg/dL   Alkaline phosphatase (APISO) 57 37 - 153 U/L   AST 14 10 - 35 U/L   ALT 18 6 - 29 U/L  Lipid Panel w/reflex Direct LDL  Result Value Ref Range   Cholesterol 171 <200 mg/dL   HDL 57 > OR = 50 mg/dL   Triglycerides 113 <150 mg/dL   LDL Cholesterol (Calc) 93 mg/dL (calc)   Total CHOL/HDL Ratio 3.0 <5.0 (calc)   Non-HDL  Cholesterol (Calc) 114 <130 mg/dL (calc)  TSH  Result Value Ref Range   TSH 1.54 0.40 - 4.50 mIU/L  CBC  Result Value Ref Range   WBC 5.8 3.8 - 10.8 Thousand/uL   RBC 4.23 3.80 - 5.10 Million/uL   Hemoglobin 13.1 11.7 - 15.5 g/dL   HCT 38.8 35.0 - 45.0 %   MCV 91.7 80.0 - 100.0 fL   MCH 31.0 27.0 - 33.0 pg   MCHC 33.8 32.0 - 36.0 g/dL   RDW 12.0 11.0 - 15.0 %   Platelets 235 140 - 400 Thousand/uL   MPV 10.8 7.5 - 12.5 fL  POCT URINALYSIS DIP (CLINITEK)  Result Value Ref Range   Color, UA yellow yellow   Clarity, UA clear clear   Glucose, UA negative negative mg/dL   Bilirubin, UA negative negative   Ketones, POC UA negative negative mg/dL   Spec Grav, UA 1.010 1.010 - 1.025   Blood, UA small (A) negative   pH, UA 5.5 5.0 - 8.0   POC PROTEIN,UA negative negative, trace   Urobilinogen, UA 0.2 0.2 or 1.0 E.U./dL   Nitrite, UA Negative Negative   Leukocytes, UA Small (1+) (A) Negative    Assessment & Plan:  .Marland KitchenMarland KitchenTreneewas seen today for follow-up and chest tightness.  Diagnoses and all orders for this visit:  Frequent urination  Acute bilateral low back pain without sciatica -     COMPLETE METABOLIC PANEL WITH GFR -     CBC with Differential/Platelet -     Lipase -     H. pylori breath test -     Troponin I - -     CK Total (and CKMB) -     D-Dimer, Quantitative  Bilateral lower abdominal pain -     COMPLETE METABOLIC PANEL WITH GFR -     CBC with Differential/Platelet -     Lipase -     H. pylori breath test -     Troponin I - -     CK Total (and CKMB) -     D-Dimer, Quantitative  Atypical chest pain -     COMPLETE METABOLIC PANEL WITH GFR -     CBC with Differential/Platelet -     Lipase -     H. pylori breath test -     Troponin I - -     CK Total (and  CKMB) -     D-Dimer, Quantitative  Epigastric pain -     COMPLETE METABOLIC PANEL WITH GFR -     CBC with Differential/Platelet -     Lipase -     H. pylori breath test -     Troponin I - -     CK  Total (and CKMB) -     D-Dimer, Quantitative  Leukocytes in urine -     COMPLETE METABOLIC PANEL WITH GFR -     CBC with Differential/Platelet -     Lipase -     H. pylori breath test -     Troponin I - -     CK Total (and CKMB) -     D-Dimer, Quantitative  Dysuria  Unclear etiology of symptoms.  No red flags today.  Blood cleared from urine but patient did have some small leukocytes.  Will culture. Last culture did not show bacteria and discussed this with patient and why we want to hold abx for now.  EKG done today for chest pain.  -normal sinus rhythm -no ST elevation or depression Ordered stat cardiac enzymes to just double check no signs of cardiac stress H.pylori done for epigastric pain as well as cmp, cbc, lipase.  US renal ultrasound is pending.  Consider urology referral for chronic cystitis if no improvement of symptoms and no cause found in work up.

## 2021-04-11 ENCOUNTER — Encounter: Payer: Self-pay | Admitting: Physician Assistant

## 2021-04-11 ENCOUNTER — Other Ambulatory Visit: Payer: Self-pay

## 2021-04-11 ENCOUNTER — Ambulatory Visit (HOSPITAL_BASED_OUTPATIENT_CLINIC_OR_DEPARTMENT_OTHER)
Admission: RE | Admit: 2021-04-11 | Discharge: 2021-04-11 | Disposition: A | Discharge status: Home / Self Care | Payer: PPO | Source: Ambulatory Visit | Attending: Physician Assistant | Admitting: Physician Assistant

## 2021-04-11 ENCOUNTER — Other Ambulatory Visit: Payer: Self-pay | Admitting: Physician Assistant

## 2021-04-11 DIAGNOSIS — R0789 Other chest pain: Secondary | ICD-10-CM

## 2021-04-11 DIAGNOSIS — R7989 Other specified abnormal findings of blood chemistry: Secondary | ICD-10-CM | POA: Diagnosis not present

## 2021-04-11 DIAGNOSIS — R350 Frequency of micturition: Secondary | ICD-10-CM

## 2021-04-11 DIAGNOSIS — R1013 Epigastric pain: Secondary | ICD-10-CM | POA: Insufficient documentation

## 2021-04-11 DIAGNOSIS — N1832 Chronic kidney disease, stage 3b: Secondary | ICD-10-CM

## 2021-04-11 DIAGNOSIS — R079 Chest pain, unspecified: Secondary | ICD-10-CM | POA: Diagnosis not present

## 2021-04-11 DIAGNOSIS — R82998 Other abnormal findings in urine: Secondary | ICD-10-CM

## 2021-04-11 MED ORDER — IOHEXOL 350 MG/ML SOLN
60.0000 mL | Freq: Once | INTRAVENOUS | Status: AC | PRN
  Administered 2021-04-11: 16:00:00 60 mL via INTRAVENOUS

## 2021-04-11 NOTE — Progress Notes (Signed)
Negative for any bacteria in stomach with h.pylori testing.  I would go to both urology and nephrology(if urology cannot find a reason either)

## 2021-04-11 NOTE — Telephone Encounter (Signed)
10:42 AM Pt informed of referral and need for CTA.  She stated that she is supposed to go for CTA this afternoon.  She also states that [facility] called her back and got her worked in for an appt on 04/13/2021.  Does she still need to see urology since you made a referral to nephrologist?  Please clarify.

## 2021-04-11 NOTE — Telephone Encounter (Signed)
JJ can we reach out to her this morning. Due to d-dimer being elevated and she is having chest tightness/pain need to get CTA of chest.

## 2021-04-11 NOTE — Progress Notes (Signed)
Renal ultrasound did not show any abnormalities. Referral placed today for urology.

## 2021-04-11 NOTE — Telephone Encounter (Signed)
Made urgent nephrology appt. And ordered CTA of lungs since d-dimer was elevated.

## 2021-04-11 NOTE — Telephone Encounter (Signed)
Pt called stating that [facility] cannot see her for 3-4 weeks.  She feels that her situation requires an urgent referral and is requesting that provider call urologist and discuss urgency so that they can see her earlier.  Please advise.  Kristine Bowman, CMA

## 2021-04-11 NOTE — Progress Notes (Signed)
Kidney function continues to worsen a bit. GFR 41. Referral made to nephrology Normal lipase and cardiac enzymes normal.  Needs CTA of lungs since d-dimer was elevated.

## 2021-04-12 LAB — LIPASE: Lipase: 30 U/L (ref 7–60)

## 2021-04-12 LAB — CBC WITH DIFFERENTIAL/PLATELET
Absolute Monocytes: 600 cells/uL (ref 200–950)
Basophils Absolute: 39 cells/uL (ref 0–200)
Basophils Relative: 0.7 %
Eosinophils Absolute: 198 cells/uL (ref 15–500)
Eosinophils Relative: 3.6 %
HCT: 37.1 % (ref 35.0–45.0)
Hemoglobin: 12.6 g/dL (ref 11.7–15.5)
Lymphs Abs: 1397 cells/uL (ref 850–3900)
MCH: 31.7 pg (ref 27.0–33.0)
MCHC: 34 g/dL (ref 32.0–36.0)
MCV: 93.5 fL (ref 80.0–100.0)
MPV: 10.5 fL (ref 7.5–12.5)
Monocytes Relative: 10.9 %
Neutro Abs: 3267 cells/uL (ref 1500–7800)
Neutrophils Relative %: 59.4 %
Platelets: 245 10*3/uL (ref 140–400)
RBC: 3.97 10*6/uL (ref 3.80–5.10)
RDW: 12 % (ref 11.0–15.0)
Total Lymphocyte: 25.4 %
WBC: 5.5 10*3/uL (ref 3.8–10.8)

## 2021-04-12 LAB — COMPLETE METABOLIC PANEL WITH GFR
AG Ratio: 1.6 (calc) (ref 1.0–2.5)
ALT: 14 U/L (ref 6–29)
AST: 12 U/L (ref 10–35)
Albumin: 4.2 g/dL (ref 3.6–5.1)
Alkaline phosphatase (APISO): 59 U/L (ref 37–153)
BUN/Creatinine Ratio: 11 (calc) (ref 6–22)
BUN: 16 mg/dL (ref 7–25)
CO2: 26 mmol/L (ref 20–32)
Calcium: 9.3 mg/dL (ref 8.6–10.4)
Chloride: 106 mmol/L (ref 98–110)
Creat: 1.42 mg/dL — ABNORMAL HIGH (ref 0.50–1.05)
Globulin: 2.7 g/dL (calc) (ref 1.9–3.7)
Glucose, Bld: 91 mg/dL (ref 65–99)
Potassium: 4.3 mmol/L (ref 3.5–5.3)
Sodium: 139 mmol/L (ref 135–146)
Total Bilirubin: 0.4 mg/dL (ref 0.2–1.2)
Total Protein: 6.9 g/dL (ref 6.1–8.1)
eGFR: 41 mL/min/{1.73_m2} — ABNORMAL LOW (ref >=60)

## 2021-04-12 LAB — CK TOTAL AND CKMB (NOT AT ARMC)
CK, MB: <0.7 ng/mL (ref 0–5.0)
Total CK: 80 U/L (ref 29–143)

## 2021-04-12 LAB — D-DIMER, QUANTITATIVE: D-Dimer, Quant: 0.81 mcg/mL FEU — ABNORMAL HIGH (ref <0.50)

## 2021-04-12 LAB — URINE CULTURE
MICRO NUMBER:: 12753872
Result:: NO GROWTH
SPECIMEN QUALITY:: ADEQUATE

## 2021-04-12 LAB — H. PYLORI BREATH TEST: H. pylori Breath Test: NOT DETECTED

## 2021-04-12 LAB — TROPONIN I: Troponin I: <3 ng/L (ref <=47)

## 2021-04-12 NOTE — Progress Notes (Signed)
No growth on urine culture of any bacteria.

## 2021-04-12 NOTE — Telephone Encounter (Signed)
Informed pt.  Pt states that she has an appt on 04/16/2021 to f/u with Dr. Linford Arnold.  She will wait until this appt.  Tiajuana Amass, CMA

## 2021-04-12 NOTE — Progress Notes (Signed)
GREAT news. No PE.

## 2021-04-13 DIAGNOSIS — R351 Nocturia: Secondary | ICD-10-CM | POA: Diagnosis not present

## 2021-04-13 DIAGNOSIS — R350 Frequency of micturition: Secondary | ICD-10-CM | POA: Diagnosis not present

## 2021-04-13 DIAGNOSIS — N390 Urinary tract infection, site not specified: Secondary | ICD-10-CM | POA: Diagnosis not present

## 2021-04-16 ENCOUNTER — Encounter: Payer: Self-pay | Admitting: Family Medicine

## 2021-04-16 ENCOUNTER — Telehealth (INDEPENDENT_AMBULATORY_CARE_PROVIDER_SITE_OTHER): Payer: PPO | Admitting: Family Medicine

## 2021-04-16 VITALS — Ht 66.0 in | Wt 193.0 lb

## 2021-04-16 DIAGNOSIS — N898 Other specified noninflammatory disorders of vagina: Secondary | ICD-10-CM | POA: Diagnosis not present

## 2021-04-16 DIAGNOSIS — N1832 Chronic kidney disease, stage 3b: Secondary | ICD-10-CM

## 2021-04-16 DIAGNOSIS — Z23 Encounter for immunization: Secondary | ICD-10-CM

## 2021-04-16 DIAGNOSIS — N179 Acute kidney failure, unspecified: Secondary | ICD-10-CM | POA: Insufficient documentation

## 2021-04-16 DIAGNOSIS — Z8639 Personal history of other endocrine, nutritional and metabolic disease: Secondary | ICD-10-CM

## 2021-04-16 DIAGNOSIS — N760 Acute vaginitis: Secondary | ICD-10-CM

## 2021-04-16 NOTE — Assessment & Plan Note (Addendum)
Creatinine had jumped up to 1.4 last week.  Plan to recheck today and will do additional panel including work-up looking for inflammation and elevated inflammatory markers, ANA etc.  She does not seem to have any significant proteinuria on her urinalysis.  Ultrasound of the kidneys was normal.  We will also evaluate for multiple myeloma since she is also been having some back pain.

## 2021-04-16 NOTE — Progress Notes (Signed)
Virtual Visit via Video Note  I connected with Kristine Bowman on 04/16/21 at  9:10 AM EST by a video enabled telemedicine application and verified that I am speaking with the correct person using two identifiers.   I discussed the limitations of evaluation and management by telemedicine and the availability of in person appointments. The patient expressed understanding and agreed to proceed.  Patient location: at home Provider location: in office  Subjective:    CC:   Chief Complaint  Patient presents with   Discuss labs and Imaging Results     HPI: Video visit to discuss recent labs and test results.  My strikingly her kidney function had jumped up to 1.4.  She has some mild renal impairment noted a year ago with a creatinine of 1.0.  But it had been gradually going up to 1.4 at this last check about a week ago.  She is also not been feeling well.  Renal ultrasound looked normal no worrisome findings.  Chest tightness x 3 days but that is better.  Mildly pos d-dimer but had negative f/u CT of chest for PE.  She has been feeling fatigued.  Urology - Saw Dr. McDiarmott and had an [place] and urine looked ok.  He did not recommend cystoscopy at that time.  Having cramping in her leg as well.  She wonders if that could be related to the kidney function.  This she also had surgery on her heel and says it is that same leg so that could be impacting it as well.  Having left sided back pain. It feels sore at times.  She feels like it is over the kidney area..    She does report a prior history of iron deficiency as well.  Past medical history, Surgical history, Family history not pertinant except as noted below, Social history, Allergies, and medications have been entered into the medical record, reviewed, and corrections made.    Objective:    General: Speaking clearly in complete sentences without any shortness of breath.  Alert and oriented x3.  Normal judgment. No apparent acute  distress.    Impression and Recommendations:    Problem List Items Addressed This Visit       Genitourinary   CKD (chronic kidney disease) stage 3, GFR 30-59 ml/min (HCC)    Creatinine had jumped up to 1.4 last week.  Plan to recheck today and will do additional panel including work-up looking for inflammation and elevated inflammatory markers, ANA etc.  She does not seem to have any significant proteinuria on her urinalysis.  Ultrasound of the kidneys was normal.  We will also evaluate for multiple myeloma since she is also been having some back pain.      Relevant Orders   BASIC METABOLIC PANEL WITH GFR   Hepatitis C antibody   HIV Antibody (routine testing w rflx)   Urine Microscopic   Microalbumin / creatinine urine ratio   Protein Electrophoresis, with Total Protein and Reflex to IFE, Urine   Protein electrophoresis, serum   ANA   C-reactive protein   Sedimentation rate   CBC with Differential/Platelet   WET PREP FOR TRICH, YEAST, CLUE   AKI (acute kidney injury) (HCC)     Other   Vaginal irritation    She has been having some irritation and overall it is better but not completely resolved.  She has been using a barrier ointment.  Recommend wet prep for further evaluation.      Other  Visit Diagnoses     Need for immunization against influenza    -  Primary   Relevant Orders   Flu Vaccine QUAD High Dose(Fluad) (Completed)   Acute vaginitis       Relevant Orders   BASIC METABOLIC PANEL WITH GFR   Hepatitis C antibody   HIV Antibody (routine testing w rflx)   Urine Microscopic   Microalbumin / creatinine urine ratio   Protein Electrophoresis, with Total Protein and Reflex to IFE, Urine   Protein electrophoresis, serum   ANA   C-reactive protein   Sedimentation rate   CBC with Differential/Platelet   WET PREP FOR TRICH, YEAST, CLUE   History of iron deficiency       Relevant Orders   Fe+TIBC+Fer       Orders Placed This Encounter  Procedures   WET PREP  FOR TRICH, YEAST, CLUE   Flu Vaccine QUAD High Dose(Fluad)   BASIC METABOLIC PANEL WITH GFR   Hepatitis C antibody   HIV Antibody (routine testing w rflx)   Urine Microscopic   Microalbumin / creatinine urine ratio   Protein Electrophoresis, with Total Protein and Reflex to IFE, Urine   Protein electrophoresis, serum   ANA   C-reactive protein   Sedimentation rate   CBC with Differential/Platelet   Fe+TIBC+Fer    No orders of the defined types were placed in this encounter.    I discussed the assessment and treatment plan with the patient. The patient was provided an opportunity to ask questions and all were answered. The patient agreed with the plan and demonstrated an understanding of the instructions.   The patient was advised to call back or seek an in-person evaluation if the symptoms worsen or if the condition fails to improve as anticipated.   Beatrice Lecher, MD

## 2021-04-16 NOTE — Assessment & Plan Note (Signed)
She has been having some irritation and overall it is better but not completely resolved.  She has been using a barrier ointment.  Recommend wet prep for further evaluation.

## 2021-04-17 ENCOUNTER — Other Ambulatory Visit: Payer: Self-pay | Admitting: *Deleted

## 2021-04-17 ENCOUNTER — Other Ambulatory Visit: Payer: Self-pay | Admitting: Family Medicine

## 2021-04-17 ENCOUNTER — Telehealth: Payer: Self-pay | Admitting: Family Medicine

## 2021-04-17 DIAGNOSIS — N760 Acute vaginitis: Secondary | ICD-10-CM | POA: Diagnosis not present

## 2021-04-17 DIAGNOSIS — Z8639 Personal history of other endocrine, nutritional and metabolic disease: Secondary | ICD-10-CM | POA: Diagnosis not present

## 2021-04-17 DIAGNOSIS — N1832 Chronic kidney disease, stage 3b: Secondary | ICD-10-CM | POA: Diagnosis not present

## 2021-04-17 LAB — WET PREP FOR TRICH, YEAST, CLUE
MICRO NUMBER:: 12779830
Specimen Quality: ADEQUATE

## 2021-04-17 MED ORDER — METRONIDAZOLE 500 MG PO TABS: 500.0000 mg | ORAL_TABLET | Freq: Two times a day (BID) | ORAL | 0 refills | Status: DC

## 2021-04-17 NOTE — Telephone Encounter (Signed)
MyChart note sent

## 2021-04-17 NOTE — Progress Notes (Signed)
Hi Girtie, the vaginal swab did show an overgrowth of bacteria called bacterial vaginitis.  I will send over prescription to clear that up.

## 2021-04-19 ENCOUNTER — Encounter: Payer: Self-pay | Admitting: Family Medicine

## 2021-04-19 LAB — CBC WITH DIFFERENTIAL/PLATELET
Absolute Monocytes: 531 cells/uL (ref 200–950)
Basophils Absolute: 41 cells/uL (ref 0–200)
Basophils Relative: 0.7 %
Eosinophils Absolute: 254 cells/uL (ref 15–500)
Eosinophils Relative: 4.3 %
HCT: 38.3 % (ref 35.0–45.0)
Hemoglobin: 12.9 g/dL (ref 11.7–15.5)
Lymphs Abs: 1611 cells/uL (ref 850–3900)
MCH: 30.9 pg (ref 27.0–33.0)
MCHC: 33.7 g/dL (ref 32.0–36.0)
MCV: 91.6 fL (ref 80.0–100.0)
MPV: 10.5 fL (ref 7.5–12.5)
Monocytes Relative: 9 %
Neutro Abs: 3463 cells/uL (ref 1500–7800)
Neutrophils Relative %: 58.7 %
Platelets: 246 10*3/uL (ref 140–400)
RBC: 4.18 10*6/uL (ref 3.80–5.10)
RDW: 11.9 % (ref 11.0–15.0)
Total Lymphocyte: 27.3 %
WBC: 5.9 10*3/uL (ref 3.8–10.8)

## 2021-04-19 LAB — IRON,TIBC AND FERRITIN PANEL
%SAT: 32 % (calc) (ref 16–45)
Ferritin: 85 ng/mL (ref 16–288)
Iron: 107 ug/dL (ref 45–160)
TIBC: 334 mcg/dL (calc) (ref 250–450)

## 2021-04-19 LAB — PROTEIN ELECTROPHORESIS, SERUM
Albumin ELP: 4.6 g/dL (ref 3.8–4.8)
Alpha 1: 0.3 g/dL (ref 0.2–0.3)
Alpha 2: 0.6 g/dL (ref 0.5–0.9)
Beta 2: 0.4 g/dL (ref 0.2–0.5)
Beta Globulin: 0.5 g/dL (ref 0.4–0.6)
Gamma Globulin: 1 g/dL (ref 0.8–1.7)
Total Protein: 7.3 g/dL (ref 6.1–8.1)

## 2021-04-19 LAB — ANA: Anti Nuclear Antibody (ANA): POSITIVE — AB

## 2021-04-19 LAB — BASIC METABOLIC PANEL WITH GFR
BUN/Creatinine Ratio: 14 (calc) (ref 6–22)
BUN: 16 mg/dL (ref 7–25)
CO2: 21 mmol/L (ref 20–32)
Calcium: 9.7 mg/dL (ref 8.6–10.4)
Chloride: 104 mmol/L (ref 98–110)
Creat: 1.14 mg/dL — ABNORMAL HIGH (ref 0.50–1.05)
Glucose, Bld: 97 mg/dL (ref 65–99)
Potassium: 4.2 mmol/L (ref 3.5–5.3)
Sodium: 137 mmol/L (ref 135–146)
eGFR: 53 mL/min/{1.73_m2} — ABNORMAL LOW (ref >=60)

## 2021-04-19 LAB — ANTI-NUCLEAR AB-TITER (ANA TITER): ANA Titer 1: 1:40 {titer} — ABNORMAL HIGH

## 2021-04-19 LAB — MICROALBUMIN / CREATININE URINE RATIO
Creatinine, Urine: 42 mg/dL (ref 20–275)
Microalb Creat Ratio: 10 mcg/mg creat (ref <30)
Microalb, Ur: 0.4 mg/dL

## 2021-04-19 LAB — SEDIMENTATION RATE: Sed Rate: 2 mm/h (ref 0–30)

## 2021-04-19 LAB — URINALYSIS, MICROSCOPIC ONLY
Bacteria, UA: NONE SEEN /HPF
Hyaline Cast: NONE SEEN /LPF
RBC / HPF: NONE SEEN /HPF (ref 0–2)
Squamous Epithelial / HPF: NONE SEEN /HPF (ref <=5)

## 2021-04-19 LAB — C-REACTIVE PROTEIN: CRP: 1.5 mg/L (ref <8.0)

## 2021-04-19 LAB — HEPATITIS C ANTIBODY
Hepatitis C Ab: NONREACTIVE
SIGNAL TO CUT-OFF: 0.02 (ref <1.00)

## 2021-04-19 LAB — HIV ANTIBODY (ROUTINE TESTING W REFLEX): HIV 1&2 Ab, 4th Generation: NONREACTIVE

## 2021-04-19 NOTE — Progress Notes (Signed)
Hi Bhavya, great news!  The creatinine is back down to 1.1, this is where you were a year ago and 6 months ago.  You do look better hydrated than you did a couple of weeks ago which is great.  It still puts your kidney function around impairment stage of 3.  But I do feel more reassured and less concerned that there is something quickly progressing going on with her kidney function.  It is really important that we do a great job in controlling your blood pressure because that can speed up progression of kidney disease.  We really want to keep your top blood pressure number under 130 in the bottom 1 under 85.  You are also negative for hepatitis and HIV.  The urine specimen underneath the microscope look good no sign of bacteria or casts or red blood cells.  We also checked for microscopic amounts of protein and that was normal.  Inflammatory markers were normal so no sign of autoimmune disease.  No anemia or infection.  Your iron stores look pretty good.  Still awaiting the ANA and protein electrophoresis results.

## 2021-04-24 LAB — PROTEIN ELECTROPHORESIS,W/TOTAL PROTEIN AND RFLX TO IFE,URINE
Creatinine, 24H Ur: 1.4 g/(24.h) (ref 0.50–2.15)
PROTEIN/CREATININE RATIO: 0.071 mg/mg creat (ref <0.150)
PROTEIN/CREATININE RATIO: 71 mg/g creat (ref <150)
Protein, 24H Urine: 100 mg/24 h (ref 0–149)

## 2021-04-25 ENCOUNTER — Encounter: Payer: Self-pay | Admitting: Family Medicine

## 2021-04-25 NOTE — Progress Notes (Signed)
No Bence-Jones proteins noted which is reassuring.  No sign of condition, multiple myeloma.

## 2021-05-01 ENCOUNTER — Encounter: Payer: Self-pay | Admitting: Family Medicine

## 2021-05-01 MED ORDER — FLUCONAZOLE 150 MG PO TABS: 150.0000 mg | ORAL_TABLET | Freq: Once | ORAL | 0 refills | Status: AC

## 2021-05-01 MED ORDER — METRONIDAZOLE 0.75 % VA GEL: 1.0000 | Freq: Two times a day (BID) | VAGINAL | 0 refills | Status: DC

## 2021-05-07 ENCOUNTER — Encounter: Payer: Self-pay | Admitting: Family Medicine

## 2021-05-07 DIAGNOSIS — N760 Acute vaginitis: Secondary | ICD-10-CM

## 2021-05-10 MED ORDER — CLINDAMYCIN PHOSPHATE 2 % VA CREA: 1.0000 | TOPICAL_CREAM | Freq: Every day | VAGINAL | 0 refills | Status: AC

## 2021-05-10 NOTE — Telephone Encounter (Signed)
Kristine Bowman document the intolerance of the metronidazole.  New prescription sent for clindamycin vaginal gel to treat for 1 week.  If after 1 week she still having irritation then I would recommend that she come in for an appointment and an exam instead of doing this over a video visit.  Also if she still having the frequency and urgency and the urine has looked clear with the urologist then my only recommendation would be to call them back and let them know that she still having the frequency and urgency and that that needs to be addressed.  We are more than happy to recollect a urine specimen if she would like but it sounds like they have already done 1 to rule out a UTI in which case the sensations that she get is getting needs to be addressed by urology

## 2021-05-15 ENCOUNTER — Encounter: Payer: Self-pay | Admitting: Family Medicine

## 2021-05-16 NOTE — Telephone Encounter (Signed)
LEFT VOICEMAIL message for patient letting her know she can not just stop by, that she has to have an appointment. Told her to call back to get this appt scheduled. AM

## 2021-05-22 ENCOUNTER — Other Ambulatory Visit: Payer: Self-pay

## 2021-05-22 ENCOUNTER — Ambulatory Visit (INDEPENDENT_AMBULATORY_CARE_PROVIDER_SITE_OTHER): Payer: PPO | Admitting: Physician Assistant

## 2021-05-22 ENCOUNTER — Encounter: Payer: Self-pay | Admitting: Physician Assistant

## 2021-05-22 VITALS — BP 143/94 | HR 68 | Ht 66.0 in | Wt 199.0 lb

## 2021-05-22 DIAGNOSIS — B9689 Other specified bacterial agents as the cause of diseases classified elsewhere: Secondary | ICD-10-CM | POA: Diagnosis not present

## 2021-05-22 DIAGNOSIS — N898 Other specified noninflammatory disorders of vagina: Secondary | ICD-10-CM | POA: Diagnosis not present

## 2021-05-22 DIAGNOSIS — N760 Acute vaginitis: Secondary | ICD-10-CM

## 2021-05-22 NOTE — Patient Instructions (Signed)

## 2021-05-22 NOTE — Progress Notes (Signed)
° °  Subjective:    Patient ID: Kristine Bowman, female    DOB: 01/20/1956, 66 y.o.   MRN: [id number]  HPI Pt is a 66 yo female who presents to the clinic to follow up on clue cells on wet prep on 12/20. She was not able to tolerate metronidazole but she did take tinadazole. She feels better today but up until today she was still having vaginal irritation. No discharge. She continues to have decreased GFR. Urology is monitoring. She would like to make sure BV is cleared.   .. 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   CKD (chronic kidney disease) stage 3, GFR 30-59 ml/min ([name]) 09/13/2020   Post-menopausal 09/13/2020   Osteopenia 11/09/2020   Hematuria 04/06/2021   Vaginal irritation 04/06/2021   Acute bilateral low back pain without sciatica 04/10/2021   Bilateral lower abdominal pain 04/10/2021   Atypical chest pain 04/10/2021   Epigastric pain 04/10/2021   Dysuria 04/10/2021   AKI (acute kidney injury) ([place]) 04/16/2021   Resolved Ambulatory Problems    Diagnosis Date Noted   BREAST PAIN, LEFT 05/11/2010   Vaginal discharge 12/03/2011   Tibialis posterior tendinitis 12/31/2012   Ingrown right big toenail 05/10/2013   Leukocytes in urine 04/10/2021   Frequent urination 04/10/2021   Past Medical History:  Diagnosis Date   Right calcaneal fracture      Review of Systems See HPI.     Objective:   Physical Exam Vitals reviewed.  Constitutional:      Appearance: Normal appearance.  HENT:     Head: Normocephalic.  Cardiovascular:     Rate and Rhythm: Normal rate.  Abdominal:     Palpations: Abdomen is soft.  Genitourinary:    Vagina: No vaginal discharge.     Comments: Erythema around urethra No discharge  Neurological:     General: No focal deficit present.     Mental Status: She is alert.  Psychiatric:        Mood and Affect: Mood normal.           Assessment & Plan:  Marland KitchenMarland KitchenPhynix was seen today for follow-up.  Diagnoses and all orders for this visit:  Bacterial vaginosis  Acute vaginitis -     SureSwab Advanced Vaginitis Plus,TMA   Sure swab done today to test for BV/Trich/yeast.  Consider vaginal estrogen for vaginal irritation if everything comes back normal and still symptomatic.

## 2021-05-23 LAB — SURESWAB® ADVANCED VAGINITIS PLUS,TMA
C. trachomatis RNA, TMA: NOT DETECTED
CANDIDA SPECIES: NOT DETECTED
Candida glabrata: NOT DETECTED
N. gonorrhoeae RNA, TMA: NOT DETECTED
SURESWAB(R) ADV BACTERIAL VAGINOSIS(BV),TMA: NEGATIVE
TRICHOMONAS VAGINALIS (TV),TMA: NOT DETECTED

## 2021-05-23 NOTE — Progress Notes (Signed)
Negative for yeast, trichomonas, BV.

## 2021-05-30 ENCOUNTER — Encounter: Payer: Self-pay | Admitting: Physician Assistant

## 2021-05-30 ENCOUNTER — Other Ambulatory Visit: Payer: Self-pay

## 2021-05-30 ENCOUNTER — Ambulatory Visit (INDEPENDENT_AMBULATORY_CARE_PROVIDER_SITE_OTHER): Payer: PPO

## 2021-05-30 ENCOUNTER — Ambulatory Visit (INDEPENDENT_AMBULATORY_CARE_PROVIDER_SITE_OTHER): Payer: PPO | Admitting: Physician Assistant

## 2021-05-30 VITALS — BP 148/102 | HR 73 | Ht 66.0 in | Wt 199.0 lb

## 2021-05-30 DIAGNOSIS — M7989 Other specified soft tissue disorders: Secondary | ICD-10-CM

## 2021-05-30 DIAGNOSIS — Z1211 Encounter for screening for malignant neoplasm of colon: Secondary | ICD-10-CM | POA: Diagnosis not present

## 2021-05-30 DIAGNOSIS — M79662 Pain in left lower leg: Secondary | ICD-10-CM | POA: Diagnosis not present

## 2021-05-30 MED ORDER — IBUPROFEN 800 MG PO TABS: 800.0000 mg | ORAL_TABLET | Freq: Three times a day (TID) | ORAL | 0 refills | Status: DC | PRN

## 2021-05-30 NOTE — Progress Notes (Signed)
GREAT news no blood clots.  I suspect that your veins have some inflammation in them called thrombophlebitis. Treatment is compression stockings, ibuprofen 600-800mg , elevation of leg, warm compresses.

## 2021-05-30 NOTE — Progress Notes (Signed)
° °  Subjective:    Patient ID: Kristine Bowman, female    DOB: 10/13/1955, 66 y.o.   MRN: [id number]  HPI Pt is a 66 yo female who presents to the clinic with 5 days of left calf pain and swelling. The pain goes up behind the left knee and even into the left upper thigh. Hurts to walk on it. Seems to be getting worse. Never had anything like this before. No injury. No fever, chills, SOB. Taking ibuprofen and tylenol as needed. Pain seems to be worsening.    .. 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   CKD (chronic kidney disease) stage 3, GFR 30-59 ml/min ([place]) 09/13/2020   Post-menopausal 09/13/2020   Osteopenia 11/09/2020   Hematuria 04/06/2021   Vaginal irritation 04/06/2021   Acute bilateral low back pain without sciatica 04/10/2021   Bilateral lower abdominal pain 04/10/2021   Atypical chest pain 04/10/2021   Epigastric pain 04/10/2021   Dysuria 04/10/2021   AKI (acute kidney injury) ([place]) 04/16/2021   Bacterial vaginosis 05/22/2021   Left leg swelling 05/30/2021   Resolved Ambulatory Problems    Diagnosis Date Noted   BREAST PAIN, LEFT 05/11/2010   Vaginal discharge 12/03/2011   Tibialis posterior tendinitis 12/31/2012   Ingrown right big toenail 05/10/2013   Leukocytes in urine 04/10/2021   Frequent urination 04/10/2021   Past Medical History:  Diagnosis Date   Right calcaneal fracture       Review of Systems See HPI.     Objective:   Physical Exam  Left Calf: 16.5inches Right Calf: 15inches  Bilateral pedal pulses 2+ Tenderness over the left calf and behind knee Slightly warm to touch Negative homans sign      Assessment & Plan:  Marland KitchenMarland KitchenBreklyn was seen today for leg pain.  Diagnoses and all orders for this visit:  Pain of left calf -     US Venous Img Lower Unilateral Left (DVT)  Left leg swelling -     US Venous Img Lower Unilateral  Left (DVT)  Colon cancer screening -     Ambulatory referral to Gastroenterology   Will get STAT venous doppler to rule out DVT. Pt had elevated d-dimer a few weeks ago with negative CTA for PE.  Will discussed treatment plan after stat results.

## 2021-06-01 ENCOUNTER — Encounter: Payer: Self-pay | Admitting: Physician Assistant

## 2021-06-01 DIAGNOSIS — I809 Phlebitis and thrombophlebitis of unspecified site: Secondary | ICD-10-CM

## 2021-06-01 DIAGNOSIS — M79662 Pain in left lower leg: Secondary | ICD-10-CM

## 2021-07-23 LAB — HM MAMMOGRAPHY

## 2021-07-27 ENCOUNTER — Encounter: Payer: Self-pay | Admitting: Family Medicine

## 2021-12-19 ENCOUNTER — Encounter: Payer: Self-pay | Admitting: General Practice

## 2022-05-22 ENCOUNTER — Encounter: Payer: Self-pay | Admitting: Physician Assistant

## 2022-05-22 ENCOUNTER — Ambulatory Visit (INDEPENDENT_AMBULATORY_CARE_PROVIDER_SITE_OTHER): Payer: PPO | Admitting: Physician Assistant

## 2022-05-22 VITALS — BP 131/86 | HR 68 | Ht 66.0 in | Wt 192.1 lb

## 2022-05-22 DIAGNOSIS — R21 Rash and other nonspecific skin eruption: Secondary | ICD-10-CM

## 2022-05-22 DIAGNOSIS — L299 Pruritus, unspecified: Secondary | ICD-10-CM

## 2022-05-22 MED ORDER — NYSTATIN-TRIAMCINOLONE 100000-0.1 UNIT/GM-% EX OINT: 1.0000 | TOPICAL_OINTMENT | Freq: Two times a day (BID) | CUTANEOUS | 1 refills | Status: DC

## 2022-05-22 NOTE — Patient Instructions (Signed)
Contact Dermatitis Dermatitis is redness, soreness, and swelling (inflammation) of the skin. Contact dermatitis is a reaction to certain substances that touch the skin. There are two types of this condition: Irritant contact dermatitis. This is the most common type. It happens when something irritates your skin, such as when your hands get dry from washing them too often with soap. You can get this type of reaction even if you have not been exposed to the irritant before. Allergic contact dermatitis. This type is caused by a substance that you are allergic to, such as poison ivy. It occurs when you have been exposed to the substance (allergen) and form a sensitivity to it. In some cases, the reaction may start soon after your first exposure to the allergen. In other cases, it may not start until you are exposed to the allergen again. It may then occur every time you are exposed to the allergen in the future. What are the causes? Irritant contact dermatitis is often caused by exposure to: Makeup. Soaps, detergents, and bleaches. Acids. Metal salts, such as nickel. Allergic contact dermatitis is often caused by exposure to: Poisonous plants. Chemicals. Jewelry. Latex. Medicines. Preservatives in products, such as clothes. What increases the risk? You are more likely to get this condition if you have: A job that exposes you to irritants or allergens. Certain medical conditions. These include asthma and eczema. What are the signs or symptoms? Symptoms of this condition may occur in any place on your body that has been touched by the irritant. Symptoms include: Dryness, flaking, or cracking. Redness. Itching. Pain or a burning feeling. Blisters. Drainage of small amounts of blood or clear fluid from skin cracks. With allergic contact dermatitis, there may also be swelling in areas such as the eyelids, mouth, or genitals. How is this diagnosed? This condition is diagnosed with a medical  history and physical exam. A patch skin test may be done to help figure out the cause. If the condition is related to your job, you may need to see an expert in health problems in the workplace (occupational medicine specialist). How is this treated? This condition is treated by staying away from the cause of the reaction and protecting your skin from further contact. Treatment may also include: Steroid creams or ointments. Steroid medicines may need be taken by mouth (orally) in more severe cases. Antibiotics or medicines applied to the skin to kill bacteria (antibacterial ointments). These may be needed if a skin infection is present. Antihistamines. These may be taken orally or put on as a lotion to ease itching. A bandage (dressing). Follow these instructions at home: Skin care Moisturize your skin as needed. Put cool, wet cloths (cool compresses) on the affected areas. Try applying baking soda paste to your skin. Stir water into baking soda until it has the consistency of a paste. Do not scratch your skin. Avoid friction to the affected area. Avoid the use of soaps, perfumes, and dyes. Check the affected areas every day for signs of infection. Check for: More redness, swelling, or pain. More fluid or blood. Warmth. Pus or a bad smell. Medicines Take or apply over-the-counter and prescription medicines only as told by your health care provider. If you were prescribed antibiotics, take or apply them as told by your health care provider. Do not stop using the antibiotic even if you start to feel better. Bathing Try taking a bath with: Epsom salts. Follow the instructions on the packaging. You can get these at your local pharmacy   or grocery store. Baking soda. Pour a small amount into the bath as told by your health care provider. Colloidal oatmeal. Follow the instructions on the packaging. You can get this at your local pharmacy or grocery store. Bathe less often. This may mean bathing  every other day. Bathe in lukewarm water. Avoid using hot water. Bandage care If you were given a dressing, change it as told by your health care provider. Wash your hands with soap and water for at least 20 seconds before and after you change your dressing. If soap and water are not available, use hand sanitizer. General instructions Avoid the substance that caused your reaction. If you do not know what caused it, keep a journal to try to track what caused it. Write down: What you eat and drink. What cosmetics you use. What you wear in the affected area. This includes jewelry. Contact a health care provider if: Your condition does not get better with treatment. Your condition gets worse. You have any signs of infection. You have a fever. You have new symptoms. Your bone or joint under the affected area becomes painful after the skin has healed. Get help right away if: You notice red streaks coming from the affected area. The affected area turns darker. You have trouble breathing. This information is not intended to replace advice given to you by your health care provider. Make sure you discuss any questions you have with your health care provider. Document Revised: 10/19/2021 Document Reviewed: 10/19/2021 Elsevier Patient Education  2023 [employer]  

## 2022-05-22 NOTE — Progress Notes (Signed)
Acute Office Visit  Subjective:     Patient ID: Kristine Bowman, female    DOB: 02/28/1956, 67 y.o.   MRN: [id number]  Chief Complaint  Patient presents with   Rash    Lower back and under breasts    Rash Pertinent negatives include no fever or shortness of breath.   Patient is in today for a rash.  She states starting two weeks ago she noticed a rash on her mid/low back with associated itchiness. Since then, she has now had some itchiness underneath her breasts and across her abdomen but does not appreciate a rash in this location. She has had shingles in the past and does not feel this is the same thing. She states it is most similar to a heat rash as it seems to be worse after she exercises and sweats. She denies any new detergents, deodorants, clothing, animals or medications. No fever, chills or pain associated with it.   She has been using Aquaphor and another OTC cream for the itching (cannot remember the name) which has been helpful but comes back if she stops it. She has been trying to avoid wearing a bra as much as possible.   Review of Systems  Constitutional:  Negative for chills and fever.  Respiratory:  Negative for shortness of breath.   Cardiovascular:  Negative for chest pain.  Musculoskeletal:  Negative for back pain.  Skin:  Positive for itching and rash.        Objective:    BP 131/86 (BP Location: Right Arm, Patient Position: Sitting, Cuff Size: Large)   Pulse 68   Ht 5\' 6"  (1.676 m)   Wt 192 lb 1.9 oz (87.1 kg)   SpO2 100%   BMI 31.01 kg/m    Physical Exam Constitutional:      General: She is not in acute distress. HENT:     Head: Normocephalic.     Right Ear: External ear normal.     Left Ear: External ear normal.     Nose: Nose normal.     Mouth/Throat:     Pharynx: Oropharynx is clear.  Eyes:     Conjunctiva/sclera: Conjunctivae normal.  Cardiovascular:     Rate and Rhythm: Normal rate and regular rhythm.  Pulmonary:     Effort:  Pulmonary effort is normal.  Abdominal:     General: Abdomen is flat.     Palpations: Abdomen is soft.  Skin:         Comments: Did not appreciate any erythema or rash across her abdomen besides some excoriations.  Neurological:     Mental Status: She is alert.     No results found for any visits on 05/22/22.      Assessment & Plan:  Marland KitchenMarland KitchenAviance was seen today for rash.  Diagnoses and all orders for this visit:  Pruritus -     nystatin-triamcinolone ointment (MYCOLOG); Apply 1 Application topically 2 (two) times daily.  Rash and nonspecific skin eruption -     nystatin-triamcinolone ointment (MYCOLOG); Apply 1 Application topically 2 (two) times daily.   I do not suspect this is shingles as there is no pain associated with it and is bilateral. Unclear etiology, irritant vs allergic contact dermatitis. She does not appreciate any trigger. Recommend a combination nystatin-triamcinolone ointment to cover for yeast as she does mention she has pruritus underneath the breast that is worse with sweat. Advised her to keep the areas dry as possible and avoid soaps and detergents with scents.  Follow up if symptoms become worse or do not resolve.

## 2022-05-24 ENCOUNTER — Encounter: Payer: Self-pay | Admitting: Physician Assistant

## 2022-06-20 ENCOUNTER — Ambulatory Visit (INDEPENDENT_AMBULATORY_CARE_PROVIDER_SITE_OTHER): Payer: PPO | Admitting: Family Medicine

## 2022-06-20 DIAGNOSIS — Z1211 Encounter for screening for malignant neoplasm of colon: Secondary | ICD-10-CM | POA: Diagnosis not present

## 2022-06-20 DIAGNOSIS — Z0000 Encounter for general adult medical examination without abnormal findings: Secondary | ICD-10-CM | POA: Diagnosis not present

## 2022-06-20 NOTE — Patient Instructions (Addendum)
[place] Maintenance Summary and Written Plan of Care  Ms. Kristine Bowman ,  Thank you for allowing me to perform your Medicare Annual Wellness Visit and for your ongoing commitment to your health.   Health Maintenance & Immunization History Health Maintenance  Topic Date Due  . COVID-19 Vaccine (3 - 2023-24 season) 07/06/2022 (Originally 12/28/2021)  . INFLUENZA VACCINE  07/28/2022 (Originally 11/27/2021)  . Zoster Vaccines- Shingrix (1 of 2) 08/21/2022 (Originally 11/15/2003)  . Pneumonia Vaccine 15+ Years old (1 of 1 - PCV) 05/23/2023 (Originally 11/15/2018)  . COLONOSCOPY (Pts 45-12yr Insurance coverage will need to be confirmed)  05/23/2023 (Originally 10/03/2019)  . MAMMOGRAM  07/24/2022  . Medicare Annual Wellness (AWV)  06/21/2023  . DTaP/Tdap/Td (2 - Td or Tdap) 01/23/2025  . DEXA SCAN  Completed  . Hepatitis C Screening  Completed  . HPV VACCINES  Aged Out   Immunization History  Administered Date(s) Administered  . Fluad Quad(high Dose 65+) 04/16/2021  . PFIZER(Purple Top)SARS-COV-2 Vaccination 12/06/2019, 12/27/2019  . Tdap 01/24/2015    These are the patient goals that we discussed:  Goals Addressed              This Visit's Progress   .  Patient Stated (pt-stated)        Patient stated that she would like to loose 20 lbs.        This is a list of Health Maintenance Items that are overdue or due now: Pneumococcal vaccine  Influenza vaccine Screening mammography Colorectal cancer screening Shingrix vaccine  Patient declined the vaccines.  She will call to schedule the mammogram and the colonoscopy.   Orders/Referrals Placed Today: Orders Placed This Encounter  Procedures  . Ambulatory referral to Gastroenterology (for Colonoscopy)    Referral Priority:   Routine    Referral Type:   Consultation    Referral Reason:   Specialty Services Required    Number of Visits Requested:   1    (Contact our referral department at  [phone] you have not spoken with someone about your referral appointment within the next 5 days)    Follow-up Plan Follow-up with MHali Marry MD as planned Patient will call and schedule mammogram and colorectal cancer screening. Medicare wellness visit in one year.  Patient will access AVS on my chart.      Health Maintenance, Female Adopting a healthy lifestyle and getting preventive care are important in promoting health and wellness. Ask your health care provider about: The right schedule for you to have regular tests and exams. Things you can do on your own to prevent diseases and keep yourself healthy. What should I know about diet, weight, and exercise? Eat a healthy diet  Eat a diet that includes plenty of vegetables, fruits, low-fat dairy products, and lean protein. Do not eat a lot of foods that are high in solid fats, added sugars, or sodium. Maintain a healthy weight Body mass index (BMI) is used to identify weight problems. It estimates body fat based on height and weight. Your health care provider can help determine your BMI and help you achieve or maintain a healthy weight. Get regular exercise Get regular exercise. This is one of the most important things you can do for your health. Most adults should: Exercise for at least 150 minutes each week. The exercise should increase your heart rate and make you sweat (moderate-intensity exercise). Do strengthening exercises at least twice a week. This is in addition to the moderate-intensity  exercise. Spend less time sitting. Even light physical activity can be beneficial. Watch cholesterol and blood lipids Have your blood tested for lipids and cholesterol at 69 years of age, then have this test every 5 years. Have your cholesterol levels checked more often if: Your lipid or cholesterol levels are high. You are older than 69 years of age. You are at high risk for heart disease. What should I know about  cancer screening? Depending on your health history and family history, you may need to have cancer screening at various ages. This may include screening for: Breast cancer. Cervical cancer. Colorectal cancer. Skin cancer. Lung cancer. What should I know about heart disease, diabetes, and high blood pressure? Blood pressure and heart disease High blood pressure causes heart disease and increases the risk of stroke. This is more likely to develop in people who have high blood pressure readings or are overweight. Have your blood pressure checked: Every 3-5 years if you are 82-31 years of age. Every year if you are 33 years old or older. Diabetes Have regular diabetes screenings. This checks your fasting blood sugar level. Have the screening done: Once every three years after age 56 if you are at a normal weight and have a low risk for diabetes. More often and at a younger age if you are overweight or have a high risk for diabetes. What should I know about preventing infection? Hepatitis B If you have a higher risk for hepatitis B, you should be screened for this virus. Talk with your health care provider to find out if you are at risk for hepatitis B infection. Hepatitis C Testing is recommended for: Everyone born from [birth year] through 1965. Anyone with known risk factors for hepatitis C. Sexually transmitted infections (STIs) Get screened for STIs, including gonorrhea and chlamydia, if: You are sexually active and are younger than 69 years of age. You are older than 69 years of age and your health care provider tells you that you are at risk for this type of infection. Your sexual activity has changed since you were last screened, and you are at increased risk for chlamydia or gonorrhea. Ask your health care provider if you are at risk. Ask your health care provider about whether you are at high risk for HIV. Your health care provider may recommend a prescription medicine to help prevent HIV  infection. If you choose to take medicine to prevent HIV, you should first get tested for HIV. You should then be tested every 3 months for as long as you are taking the medicine. Pregnancy If you are about to stop having your period (premenopausal) and you may become pregnant, seek counseling before you get pregnant. Take 400 to 800 micrograms (mcg) of folic acid every day if you become pregnant. Ask for birth control (contraception) if you want to prevent pregnancy. Osteoporosis and menopause Osteoporosis is a disease in which the bones lose minerals and strength with aging. This can result in bone fractures. If you are 88 years old or older, or if you are at risk for osteoporosis and fractures, ask your health care provider if you should: Be screened for bone loss. Take a calcium or vitamin D supplement to lower your risk of fractures. Be given hormone replacement therapy (HRT) to treat symptoms of menopause. Follow these instructions at home: Alcohol use Do not drink alcohol if: Your health care provider tells you not to drink. You are pregnant, may be pregnant, or are planning to become pregnant. If  you drink alcohol: Limit how much you have to: 0-1 drink a day. Know how much alcohol is in your drink. In the [place] one drink equals one 12 oz bottle of beer (355 mL), one 5 oz glass of wine (148 mL), or one 1 oz glass of hard liquor (44 mL). Lifestyle Do not use any products that contain nicotine or tobacco. These products include cigarettes, chewing tobacco, and vaping devices, such as e-cigarettes. If you need help quitting, ask your health care provider. Do not use street drugs. Do not share needles. Ask your health care provider for help if you need support or information about quitting drugs. General instructions Schedule regular health, dental, and eye exams. Stay current with your vaccines. Tell your health care provider if: You often feel depressed. You have ever been abused  or do not feel safe at home. Summary Adopting a healthy lifestyle and getting preventive care are important in promoting health and wellness. Follow your health care provider's instructions about healthy diet, exercising, and getting tested or screened for diseases. Follow your health care provider's instructions on monitoring your cholesterol and blood pressure. This information is not intended to replace advice given to you by your health care provider. Make sure you discuss any questions you have with your health care provider. Document Revised: 09/04/2020 Document Reviewed: 09/04/2020 Elsevier Patient Education  [employer].

## 2022-06-20 NOTE — Progress Notes (Signed)
MEDICARE ANNUAL WELLNESS VISIT  06/20/2022  Telephone Visit Disclaimer This Medicare AWV was conducted by telephone due to national recommendations for restrictions regarding the COVID-19 Pandemic (e.g. social distancing).  I verified, using two identifiers, that I am speaking with Kristine Bowman or their authorized healthcare agent. I discussed the limitations, risks, security, and privacy concerns of performing an evaluation and management service by telephone and the potential availability of an in-person appointment in the future. The patient expressed understanding and agreed to proceed.  Location of Patient: Home Location of Provider (nurse):  In the office.  Subjective:    Kristine Bowman is a 67 y.o. female patient of Metheney, Rene Kocher, MD who had a Medicare Annual Wellness Visit today via telephone. Kristine Bowman is Retired and lives with their spouse. she has 2 children. she reports that she is socially active and does interact with friends/family regularly. she is moderately physically active and enjoys being outdoors and going to the beach.  Patient Care Team: Kristine Marry, MD as PCP - General (Family Medicine)     06/20/2022    8:08 AM 07/27/2020    2:03 PM 07/26/2020    9:33 AM  Advanced Directives  Does Patient Have a Medical Advance Directive? No No No  Would patient like information on creating a medical advance directive? No - Patient declined No - Patient declined No - Patient declined    Hospital Utilization Over the Past 12 Months: # of hospitalizations or ER visits: 0 # of surgeries: 0  Review of Systems    Patient reports that her overall health is better compared to last year.  History obtained from chart review and the patient  Patient Reported Readings (BP, Pulse, CBG, Weight, etc) none  Pain Assessment Pain : No/denies pain     Current Medications & Allergies (verified) Allergies as of 06/20/2022       Reactions   Oxycodone Nausea Only    Betadine [povidone Iodine]    Codeine    Metronidazole    Vaginal burning/yeast infection   Tramadol Nausea And Vomiting   Meloxicam Itching, Rash        Medication List        Accurate as of June 20, 2022  8:23 AM. If you have any questions, ask your nurse or doctor.          nystatin-triamcinolone ointment Commonly known as: MYCOLOG Apply 1 Application topically 2 (two) times daily.        History (reviewed): Past Medical History:  Diagnosis Date   KNEE PAIN 06/26/2010   Qualifier: Diagnosis of  By: Madilyn Fireman MD, Catherine     Right calcaneal fracture    SHOULDER PAIN, RIGHT 07/09/2010   Qualifier: Diagnosis of  By: Madilyn Fireman MD, Barnetta Chapel     Past Surgical History:  Procedure Laterality Date   COLONOSCOPY     ORIF CALCANEOUS FRACTURE Right 07/27/2020   Procedure: OPEN REDUCTION INTERNAL FIXATION (ORIF) CALCANEOUS FRACTURE;  Surgeon: Wylene Simmer, MD;  Location: [facility];  Service: Orthopedics;  Laterality: Right;  36mn   Family History  Problem Relation Age of Onset   Heart disease Father 758      quadruple bypass   Hypertension Mother 552  Heart disease Maternal Grandfather    Breast cancer Paternal Grandmother    Heart disease Paternal Grandfather    Social History   Socioeconomic History   Marital status: Married    Spouse name: Kristine Bowman  Number of  children: 2   Years of education: 12   Highest education level: 12th grade  Occupational History   Occupation: Geologist, engineering MANAGE    Employer: [employer]   Occupation: HR Consultant    Comment: Full time  Tobacco Use   Smoking status: Never   Smokeless tobacco: Never  Vaping Use   Vaping Use: Never used  Substance and Sexual Activity   Alcohol use: Yes    Alcohol/week: 3.0 standard drinks of alcohol    Types: 3 Standard drinks or equivalent per week    Comment: social   Drug use: No   Sexual activity: Yes    Partners: Male    Birth control/protection:  Post-menopausal  Other Topics Concern   Not on file  Social History Narrative   Lives with her spouse. She has two children. She enjoys being outdoors and going to the beach.   Social Determinants of Health   Financial Resource Strain: Low Risk  (06/20/2022)   Overall Financial Resource Strain (CARDIA)    Difficulty of Paying Living Expenses: Not hard at all  Food Insecurity: No Food Insecurity (06/20/2022)   Hunger Vital Sign    Worried About Running Out of Food in the Last Year: Never true    Ran Out of Food in the Last Year: Never true  Transportation Needs: No Transportation Needs (06/20/2022)   PRAPARE - Hydrologist (Medical): No    Lack of Transportation (Non-Medical): No  Physical Activity: Sufficiently Active (06/20/2022)   Exercise Vital Sign    Days of Exercise per Week: 4 days    Minutes of Exercise per Session: 60 min  Stress: No Stress Concern Present (06/20/2022)   [place]    Feeling of Stress : Not at all  Social Connections: Moderately Integrated (06/20/2022)   Social Connection and Isolation Panel [NHANES]    Frequency of Communication with Friends and Family: More than three times a week    Frequency of Social Gatherings with Friends and Family: More than three times a week    Attends Religious Services: Never    Marine scientist or Organizations: Yes    Attends Music therapist: More than 4 times per year    Marital Status: Married    Activities of Daily Living    06/20/2022    8:12 AM  In your present state of health, do you have any difficulty performing the following activities:  Hearing? 0  Vision? 0  Difficulty concentrating or making decisions? 0  Walking or climbing stairs? 0  Dressing or bathing? 0  Doing errands, shopping? 0  Preparing Food and eating ? N  Using the Toilet? N  In the past six months, have you accidently leaked  urine? N  Do you have problems with loss of bowel control? N  Managing your Medications? N  Managing your Finances? N  Housekeeping or managing your Housekeeping? N    Patient Education/ Literacy How often do you need to have someone help you when you read instructions, pamphlets, or other written materials from your doctor or pharmacy?: 1 - Never What is the last grade level you completed in school?: 12th grade  Exercise Current Exercise Habits: Structured exercise class;Home exercise routine, Type of exercise: yoga;walking (silver sneakers), Time (Minutes): 60, Frequency (Times/Week): 4, Weekly Exercise (Minutes/Week): 240, Intensity: Moderate, Exercise limited by: None identified  Diet Patient reports consuming 3 meals a day and 2  snack(s) a day Patient reports that her primary diet is: Regular Patient reports that she does have regular access to food.   Depression Screen    06/20/2022    8:08 AM 05/22/2022   11:12 AM 05/22/2022   10:51 AM 09/13/2020   10:58 AM 08/16/2019    9:03 AM  PHQ 2/9 Scores  PHQ - 2 Score 0 0 0 0 0  PHQ- 9 Score     0     Fall Risk    06/20/2022    8:08 AM 05/22/2022   11:12 AM 05/22/2022   10:51 AM 08/16/2019    9:03 AM  Fall Risk   Falls in the past year? 0 0 0 0  Number falls in past yr: 0 0 0 0  Injury with Fall? 0 0 0 0  Risk for fall due to : No Fall Risks No Fall Risks No Fall Risks   Follow up Falls evaluation completed Falls evaluation completed Falls evaluation completed      Objective:  Kristine Bowman seemed alert and oriented and she participated appropriately during our telephone visit.  Blood Pressure Weight BMI  BP Readings from Last 3 Encounters:  05/22/22 131/86  05/30/21 (!) 148/102  05/22/21 (!) 143/94   Wt Readings from Last 3 Encounters:  05/22/22 192 lb 1.9 oz (87.1 kg)  05/30/21 199 lb (90.3 kg)  05/22/21 199 lb (90.3 kg)   BMI Readings from Last 1 Encounters:  05/22/22 31.01 kg/m    *Unable to obtain current vital  signs, weight, and BMI due to telephone visit type  Hearing/Vision  Kristine Bowman did not seem to have difficulty with hearing/understanding during the telephone conversation Reports that she has had a formal eye exam by an eye care professional within the past year Reports that she has not had a formal hearing evaluation within the past year *Unable to fully assess hearing and vision during telephone visit type  Cognitive Function:    06/20/2022    8:14 AM  6CIT Screen  What Year? 0 points  What month? 0 points  What time? 0 points  Count back from 20 0 points  Months in reverse 0 points  Repeat phrase 0 points  Total Score 0 points   (Normal:0-7, Significant for Dysfunction: >8)  Normal Cognitive Function Screening: Yes   Immunization & Health Maintenance Record Immunization History  Administered Date(s) Administered   Fluad Quad(high Dose 65+) 04/16/2021   PFIZER(Purple Top)SARS-COV-2 Vaccination 12/06/2019, 12/27/2019   Tdap 01/24/2015    Health Maintenance  Topic Date Due   COVID-19 Vaccine (3 - 2023-24 season) 07/06/2022 (Originally 12/28/2021)   INFLUENZA VACCINE  07/28/2022 (Originally 11/27/2021)   Zoster Vaccines- Shingrix (1 of 2) 08/21/2022 (Originally 11/15/2003)   Pneumonia Vaccine 17+ Years old (1 of 1 - PCV) 05/23/2023 (Originally 11/15/2018)   COLONOSCOPY (Pts 45-70yr Insurance coverage will need to be confirmed)  05/23/2023 (Originally 10/03/2019)   MAMMOGRAM  07/24/2022   Medicare Annual Wellness (AWV)  06/21/2023   DTaP/Tdap/Td (2 - Td or Tdap) 01/23/2025   DEXA SCAN  Completed   Hepatitis C Screening  Completed   HPV VACCINES  Aged Out       Assessment  This is a routine wellness examination for Kristine Bowman  Health Maintenance: Due or Overdue There are no preventive care reminders to display for this patient.   Kristine Grusedoes not need a referral for Community Assistance: Care Management:   no Social Work:    no Prescription  Assistance:  no Nutrition/Diabetes Education:  no   Plan:  Personalized Goals  Goals Addressed               This Visit's Progress     Patient Stated (pt-stated)        Patient stated that she would like to loose 20 lbs.       Personalized Health Maintenance & Screening Recommendations  Pneumococcal vaccine  Influenza vaccine Screening mammography Colorectal cancer screening Shingrix vaccine  Patient declined the vaccines.  She will call to schedule the mammogram and the colonoscopy.   Lung Cancer Screening Recommended: no (Low Dose CT Chest recommended if Age 10-80 years, 30 pack-year currently smoking OR have quit w/in past 15 years) Hepatitis C Screening recommended: no HIV Screening recommended: no  Advanced Directives: Written information was not prepared per patient's request.  Referrals & Orders Orders Placed This Encounter  Procedures   Ambulatory referral to Gastroenterology (for Colonoscopy)    Follow-up Plan Follow-up with Kristine Marry, MD as planned Patient will call and schedule mammogram and colorectal cancer screening. Medicare wellness visit in one year.  Patient will access AVS on my chart.   I have personally reviewed and noted the following in the patient's chart:   Medical and social history Use of alcohol, tobacco or illicit drugs  Current medications and supplements Functional ability and status Nutritional status Physical activity Advanced directives List of other physicians Hospitalizations, surgeries, and ER visits in previous 12 months Vitals Screenings to include cognitive, depression, and falls Referrals and appointments  In addition, I have reviewed and discussed with Kristine Bowman certain preventive protocols, quality metrics, and best practice recommendations. A written personalized care plan for preventive services as well as general preventive health recommendations is available and can be mailed to the patient at  her request.      Tinnie Gens, RN BSN  06/20/2022

## 2022-08-20 ENCOUNTER — Telehealth: Payer: Self-pay | Admitting: Family Medicine

## 2022-08-20 DIAGNOSIS — G8929 Other chronic pain: Secondary | ICD-10-CM

## 2022-08-20 NOTE — Telephone Encounter (Signed)
Left message on patients voicemail updating her on referral status. Completed referral faxed to [facility] in [place].

## 2022-08-20 NOTE — Telephone Encounter (Signed)
Referral, clinical notes and copies of insurance cards have been faxed to [facility] at [phone]. Office will contact patient to schedule referral appointment.

## 2022-08-20 NOTE — Telephone Encounter (Signed)
Referral placed and routed to [facility].

## 2022-08-20 NOTE — Telephone Encounter (Signed)
Received incoming voicemail from patient asking for referral for Physical therapy for [facility] in [place]. Pt states she has some sciatica in her lower back and would like to work with the company. Please advise.

## 2022-08-20 NOTE — Telephone Encounter (Signed)
Ok to place referral.

## 2022-08-23 ENCOUNTER — Other Ambulatory Visit: Payer: Self-pay | Admitting: *Deleted

## 2022-08-23 MED ORDER — IBUPROFEN 800 MG PO TABS: 800.0000 mg | ORAL_TABLET | Freq: Three times a day (TID) | ORAL | 0 refills | Status: DC | PRN

## 2022-08-26 ENCOUNTER — Ambulatory Visit (INDEPENDENT_AMBULATORY_CARE_PROVIDER_SITE_OTHER): Payer: PPO | Admitting: Physician Assistant

## 2022-08-26 ENCOUNTER — Ambulatory Visit (INDEPENDENT_AMBULATORY_CARE_PROVIDER_SITE_OTHER): Payer: PPO

## 2022-08-26 VITALS — BP 139/86 | HR 75 | Resp 20 | Ht 66.0 in | Wt 195.0 lb

## 2022-08-26 DIAGNOSIS — M5416 Radiculopathy, lumbar region: Secondary | ICD-10-CM | POA: Diagnosis not present

## 2022-08-26 DIAGNOSIS — M5442 Lumbago with sciatica, left side: Secondary | ICD-10-CM

## 2022-08-26 MED ORDER — PREDNISONE 50 MG PO TABS
ORAL_TABLET | ORAL | 0 refills | Status: DC
Start: 2022-08-26 — End: 2022-08-30

## 2022-08-26 NOTE — Progress Notes (Unsigned)
   Acute Office Visit  Subjective:     Patient ID: Kristine Bowman, female    DOB: April 11, 1956, 67 y.o.   MRN: [id number]  Chief Complaint  Patient presents with  . Back Pain    LOWER LEFT SIDE    HPI Patient is in today for *** Spin in November and December twice a week  Left hip and lower back  Numbness and tingling 3 weeks  Left toe numnbess Never had before  Walking better and movement helps  [facility] session and traction  Massage therapy 2 session   ROS      Objective:    BP 139/86 (BP Location: Left Arm, Cuff Size: Normal)   Pulse 75   Resp 20   Ht 5\' 6"  (1.676 m)   Wt 195 lb 0.6 oz (88.5 kg)   SpO2 99%   BMI 31.48 kg/m  BP Readings from Last 3 Encounters:  08/26/22 139/86  05/22/22 131/86  05/30/21 (!) 148/102   Wt Readings from Last 3 Encounters:  08/26/22 195 lb 0.6 oz (88.5 kg)  05/22/22 192 lb 1.9 oz (87.1 kg)  05/30/21 199 lb (90.3 kg)      Physical Exam  No results found for any visits on 08/26/22.      Assessment & Plan:   Problem List Items Addressed This Visit   None   No orders of the defined types were placed in this encounter.   No follow-ups on file.  Tandy Gaw, PA-C

## 2022-08-26 NOTE — Patient Instructions (Signed)
Low Back Sprain or Strain Rehab Ask your health care provider which exercises are safe for you. Do exercises exactly as told by your health care provider and adjust them as directed. It is normal to feel mild stretching, pulling, tightness, or discomfort as you do these exercises. Stop right away if you feel sudden pain or your pain gets worse. Do not begin these exercises until told by your health care provider. Stretching and range-of-motion exercises These exercises warm up your muscles and joints and improve the movement and flexibility of your back. These exercises also help to relieve pain, numbness, and tingling. Lumbar rotation  Lie on your back on a firm bed or the floor with your knees bent. Straighten your arms out to your sides so each arm forms a 90-degree angle (right angle) with a side of your body. Slowly move (rotate) both of your knees to one side of your body until you feel a stretch in your lower back (lumbar). Try not to let your shoulders lift off the floor. Hold this position for __________ seconds. Tense your abdominal muscles and slowly move your knees back to the starting position. Repeat this exercise on the other side of your body. Repeat __________ times. Complete this exercise __________ times a day. Single knee to chest  Lie on your back on a firm bed or the floor with both legs straight. Bend one of your knees. Use your hands to move your knee up toward your chest until you feel a gentle stretch in your lower back and buttock. Hold your leg in this position by holding on to the front of your knee. Keep your other leg as straight as possible. Hold this position for __________ seconds. Slowly return to the starting position. Repeat with your other leg. Repeat __________ times. Complete this exercise __________ times a day. Prone extension on elbows  Lie on your abdomen on a firm bed or the floor (prone position). Prop yourself up on your elbows. Use your arms  to help lift your chest up until you feel a gentle stretch in your abdomen and your lower back. This will place some of your body weight on your elbows. If this is uncomfortable, try stacking pillows under your chest. Your hips should stay down, against the surface that you are lying on. Keep your hip and back muscles relaxed. Hold this position for __________ seconds. Slowly relax your upper body and return to the starting position. Repeat __________ times. Complete this exercise __________ times a day. Strengthening exercises These exercises build strength and endurance in your back. Endurance is the ability to use your muscles for a long time, even after they get tired. Pelvic tilt This exercise strengthens the muscles that lie deep in the abdomen. Lie on your back on a firm bed or the floor with your legs extended. Bend your knees so they are pointing toward the ceiling and your feet are flat on the floor. Tighten your lower abdominal muscles to press your lower back against the floor. This motion will tilt your pelvis so your tailbone points up toward the ceiling instead of pointing to your feet or the floor. To help with this exercise, you may place a small towel under your lower back and try to push your back into the towel. Hold this position for __________ seconds. Let your muscles relax completely before you repeat this exercise. Repeat __________ times. Complete this exercise __________ times a day. Alternating arm and leg raises  Get on your hands   and knees on a firm surface. If you are on a hard floor, you may want to use padding, such as an exercise mat, to cushion your knees. Line up your arms and legs. Your hands should be directly below your shoulders, and your knees should be directly below your hips. Lift your left leg behind you. At the same time, raise your right arm and straighten it in front of you. Do not lift your leg higher than your hip. Do not lift your arm higher  than your shoulder. Keep your abdominal and back muscles tight. Keep your hips facing the ground. Do not arch your back. Keep your balance carefully, and do not hold your breath. Hold this position for __________ seconds. Slowly return to the starting position. Repeat with your right leg and your left arm. Repeat __________ times. Complete this exercise __________ times a day. Abdominal set with straight leg raise  Lie on your back on a firm bed or the floor. Bend one of your knees and keep your other leg straight. Tense your abdominal muscles and lift your straight leg up, 4-6 inches (10-15 cm) off the ground. Keep your abdominal muscles tight and hold this position for __________ seconds. Do not hold your breath. Do not arch your back. Keep it flat against the ground. Keep your abdominal muscles tense as you slowly lower your leg back to the starting position. Repeat with your other leg. Repeat __________ times. Complete this exercise __________ times a day. Single leg lower with bent knees Lie on your back on a firm bed or the floor. Tense your abdominal muscles and lift your feet off the floor, one foot at a time, so your knees and hips are bent in 90-degree angles (right angles). Your knees should be over your hips and your lower legs should be parallel to the floor. Keeping your abdominal muscles tense and your knee bent, slowly lower one of your legs so your toe touches the ground. Lift your leg back up to return to the starting position. Do not hold your breath. Do not let your back arch. Keep your back flat against the ground. Repeat with your other leg. Repeat __________ times. Complete this exercise __________ times a day. Posture and body mechanics Good posture and healthy body mechanics can help to relieve stress in your body's tissues and joints. Body mechanics refers to the movements and positions of your body while you do your daily activities. Posture is part of body  mechanics. Good posture means: Your spine is in its natural S-curve position (neutral). Your shoulders are pulled back slightly. Your head is not tipped forward (neutral). Follow these guidelines to improve your posture and body mechanics in your everyday activities. Standing  When standing, keep your spine neutral and your feet about hip-width apart. Keep a slight bend in your knees. Your ears, shoulders, and hips should line up. When you do a task in which you stand in one place for a long time, place one foot up on a stable object that is 2-4 inches (5-10 cm) high, such as a footstool. This helps keep your spine neutral. Sitting  When sitting, keep your spine neutral and keep your feet flat on the floor. Use a footrest, if necessary, and keep your thighs parallel to the floor. Avoid rounding your shoulders, and avoid tilting your head forward. When working at a desk or a computer, keep your desk at a height where your hands are slightly lower than your elbows. Slide your   chair under your desk so you are close enough to maintain good posture. When working at a computer, place your monitor at a height where you are looking straight ahead and you do not have to tilt your head forward or downward to look at the screen. Resting When lying down and resting, avoid positions that are most painful for you. If you have pain with activities such as sitting, bending, stooping, or squatting, lie in a position in which your body does not bend very much. For example, avoid curling up on your side with your arms and knees near your chest (fetal position). If you have pain with activities such as standing for a long time or reaching with your arms, lie with your spine in a neutral position and bend your knees slightly. Try the following positions: Lying on your side with a pillow between your knees. Lying on your back with a pillow under your knees. Lifting  When lifting objects, keep your feet at least  shoulder-width apart and tighten your abdominal muscles. Bend your knees and hips and keep your spine neutral. It is important to lift using the strength of your legs, not your back. Do not lock your knees straight out. Always ask for help to lift heavy or awkward objects. This information is not intended to replace advice given to you by your health care provider. Make sure you discuss any questions you have with your health care provider. Document Revised: 07/03/2020 Document Reviewed: 07/03/2020 Elsevier Patient Education  2023 Elsevier Inc.  

## 2022-08-27 ENCOUNTER — Encounter: Payer: Self-pay | Admitting: Physician Assistant

## 2022-08-28 ENCOUNTER — Encounter: Payer: Self-pay | Admitting: Physician Assistant

## 2022-08-28 DIAGNOSIS — M51369 Other intervertebral disc degeneration, lumbar region without mention of lumbar back pain or lower extremity pain: Secondary | ICD-10-CM | POA: Insufficient documentation

## 2022-08-28 DIAGNOSIS — M5136 Other intervertebral disc degeneration, lumbar region: Secondary | ICD-10-CM | POA: Insufficient documentation

## 2022-08-28 DIAGNOSIS — M5416 Radiculopathy, lumbar region: Secondary | ICD-10-CM | POA: Insufficient documentation

## 2022-08-28 NOTE — Telephone Encounter (Signed)
Spoke with patient to confirm where she wanted her physical therapy referral sent to. Previous PT orders were faxed to [facility] in [place]. Patient states that [facility] is out of network with her insurance company.  Referral and clinical notes have been faxed to [facility]. Office will contact patient to schedule referral appointment. Pt aware.

## 2022-08-28 NOTE — Progress Notes (Signed)
Kristine Bowman,  7mm of slippage of disc at L4 and L5 which correlates to your numbness and pain.  Disc space narrowing at L4 and L5.  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   Please make appt with Dr. Karie Schwalbe sports medicine here in our office for consult.

## 2022-08-30 ENCOUNTER — Telehealth: Payer: Self-pay

## 2022-08-30 ENCOUNTER — Ambulatory Visit: Payer: PPO

## 2022-08-30 ENCOUNTER — Other Ambulatory Visit: Payer: Self-pay

## 2022-08-30 MED ORDER — TRAMADOL HCL 50 MG PO TABS: 50.0000 mg | ORAL_TABLET | Freq: Four times a day (QID) | ORAL | 0 refills | Status: DC | PRN

## 2022-08-30 MED ORDER — MELOXICAM 15 MG PO TABS: 15.0000 mg | ORAL_TABLET | Freq: Every day | ORAL | 0 refills | Status: DC

## 2022-08-30 NOTE — Telephone Encounter (Signed)
Patient called. States that she has seen results of lumbar x-ray and has scheduled appt with Dr. Benjamin Stain on Monday 09/02/22 at 11:30. She states she is still having a lot of low back pain into left leg. Tylenol has not helped very much She has taken the 5 days of prednisone -this helped a little but caused a bad headache.  She is wanting to know if anything can be don for pain until she can be seen?

## 2022-09-02 ENCOUNTER — Ambulatory Visit (INDEPENDENT_AMBULATORY_CARE_PROVIDER_SITE_OTHER): Payer: PPO | Admitting: Sports Medicine

## 2022-09-02 DIAGNOSIS — M5416 Radiculopathy, lumbar region: Secondary | ICD-10-CM

## 2022-09-02 MED ORDER — PREGABALIN 50 MG PO CAPS
ORAL_CAPSULE | ORAL | 3 refills | Status: DC
Start: 2022-09-02 — End: 2022-10-14

## 2022-09-02 NOTE — Assessment & Plan Note (Addendum)
Pleasant 67 year old female, left lumbar radiculitis, L4 distribution, L4-L5 DDD on x-rays. She has failed greater than 6 weeks of conservative treatment so proceeding with MRI for epidural planning, she would like me to order the injection as soon as we see the results. I would like her to do some additional physical therapy, she did not tolerate gabapentin, she has already finished prednisone, does not tolerate narcotics so we will add Lyrica in a low-dose with an up taper. Return to see me in about 6 weeks.  Update: Multiple pathologic findings including S1 nerve root impingement, a large left L5-S1 facet cyst impinging into the spinal canal and spinal stenosis at L4-L5.  I think we should start with the left S1 epidural as well as aspirating and injecting the facet synovial cyst.  If this fails we will move up to the L4-L5 level.

## 2022-09-02 NOTE — Progress Notes (Addendum)
    Procedures performed today:    None.  Independent interpretation of notes and tests performed by another provider:   L4-L5 spondylitic changes on x-ray.  MRI personally reviewed, there were pathologic findings including S1 nerve root impingement, a large left L5-S1 facet cyst impinging into the spinal canal and spinal stenosis at L4-L5.  Brief History, Exam, Impression, and Recommendations:    Left lumbar radiculitis Pleasant 67 year old female, left lumbar radiculitis, L4 distribution, L4-L5 DDD on x-rays. She has failed greater than 6 weeks of conservative treatment so proceeding with MRI for epidural planning, she would like me to order the injection as soon as we see the results. I would like her to do some additional physical therapy, she did not tolerate gabapentin, she has already finished prednisone, does not tolerate narcotics so we will add Lyrica in a low-dose with an up taper. Return to see me in about 6 weeks.  Update: Multiple pathologic findings including S1 nerve root impingement, a large left L5-S1 facet cyst impinging into the spinal canal and spinal stenosis at L4-L5.  I think we should start with the left S1 epidural as well as aspirating and injecting the facet synovial cyst.  If this fails we will move up to the L4-L5 level.    ____________________________________________ [name]. Benjamin Stain, M.D., ABFM., CAQSM., AME. Primary Care and Sports Medicine [facility]  Adjunct Professor of Family Medicine  [place] [employer]  Restaurant manager, fast food

## 2022-09-06 ENCOUNTER — Telehealth: Payer: Self-pay | Admitting: Sports Medicine

## 2022-09-06 NOTE — Telephone Encounter (Signed)
Patient would like a call back she is still in pain and is asking does she need to do MRI or not Otc medications are not helping [phone]

## 2022-09-06 NOTE — Telephone Encounter (Signed)
If she is still in pain then yes, we had already discussed that, MRI was ordered, and is already scheduled.  How frequent is she taking the Lyrica right now?  We will go up on that in the meantime.

## 2022-09-07 ENCOUNTER — Ambulatory Visit (INDEPENDENT_AMBULATORY_CARE_PROVIDER_SITE_OTHER): Payer: PPO

## 2022-09-07 DIAGNOSIS — M5416 Radiculopathy, lumbar region: Secondary | ICD-10-CM

## 2022-09-10 ENCOUNTER — Other Ambulatory Visit: Payer: Self-pay

## 2022-09-10 ENCOUNTER — Encounter: Payer: Self-pay | Admitting: Physical Therapy

## 2022-09-10 ENCOUNTER — Telehealth: Payer: Self-pay

## 2022-09-10 ENCOUNTER — Ambulatory Visit: Payer: PPO | Attending: Sports Medicine | Admitting: Physical Therapy

## 2022-09-10 DIAGNOSIS — M5459 Other low back pain: Secondary | ICD-10-CM | POA: Diagnosis present

## 2022-09-10 DIAGNOSIS — M79605 Pain in left leg: Secondary | ICD-10-CM | POA: Diagnosis present

## 2022-09-10 DIAGNOSIS — R2689 Other abnormalities of gait and mobility: Secondary | ICD-10-CM | POA: Diagnosis present

## 2022-09-10 DIAGNOSIS — M5416 Radiculopathy, lumbar region: Secondary | ICD-10-CM | POA: Diagnosis not present

## 2022-09-10 DIAGNOSIS — M5417 Radiculopathy, lumbosacral region: Secondary | ICD-10-CM | POA: Insufficient documentation

## 2022-09-10 DIAGNOSIS — M6281 Muscle weakness (generalized): Secondary | ICD-10-CM | POA: Insufficient documentation

## 2022-09-10 NOTE — Telephone Encounter (Signed)
Patient came into office and would like to know when she will be able to get her injection that is done at another facility, please advise, thanks.

## 2022-09-10 NOTE — Therapy (Unsigned)
OUTPATIENT PHYSICAL THERAPY THORACOLUMBAR EVALUATION   Patient Name: Kristine Bowman MRN: [id number] DOB:1955-11-15, 67 y.o., female Today's Date: 09/11/2022  END OF SESSION:  PT End of Session - 09/10/22 1455     Visit Number 1    Number of Visits 8    Date for PT Re-Evaluation 11/06/22    Authorization Type [employer]    PT Start Time 1452    PT Stop Time 1530    PT Time Calculation (min) 38 min    Activity Tolerance Patient tolerated treatment well    Behavior During Therapy [facility] for tasks assessed/performed             Past Medical History:  Diagnosis Date   KNEE PAIN 06/26/2010   Qualifier: Diagnosis of  By: Linford Arnold MD, Catherine     Right calcaneal fracture    SHOULDER PAIN, RIGHT 07/09/2010   Qualifier: Diagnosis of  By: Linford Arnold MD, Santina Evans     Past Surgical History:  Procedure Laterality Date   COLONOSCOPY     ORIF CALCANEOUS FRACTURE Right 07/27/2020   Procedure: OPEN REDUCTION INTERNAL FIXATION (ORIF) CALCANEOUS FRACTURE;  Surgeon: Toni Arthurs, MD;  Location: [facility];  Service: Orthopedics;  Laterality: Right;    Patient Active Problem List   Diagnosis Date Noted   Left lumbar radiculitis 08/28/2022   DDD (degenerative disc disease), lumbar 08/28/2022   Acute left-sided low back pain with left-sided sciatica 08/26/2022   Left leg swelling 05/30/2021   Bacterial vaginosis 05/22/2021   AKI (acute kidney injury) (HCC) 04/16/2021   Acute bilateral low back pain without sciatica 04/10/2021   Bilateral lower abdominal pain 04/10/2021   Atypical chest pain 04/10/2021   Epigastric pain 04/10/2021   Dysuria 04/10/2021   Hematuria 04/06/2021   Vaginal irritation 04/06/2021   Osteopenia 11/09/2020   CKD (chronic kidney disease) stage 3, GFR 30-59 ml/min (HCC) 09/13/2020   Post-menopausal 09/13/2020   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   SHOULDER PAIN, RIGHT 07/09/2010   KNEE PAIN 06/26/2010    PCP: Nani Gasser, MD  REFERRING PROVIDER: Rodney Langton, MD  REFERRING DIAG: M54.16 (ICD-10-CM) - Left lumbar radiculitis   Rationale for Evaluation and Treatment: Rehabilitation  THERAPY DIAG:  Radiculopathy, lumbosacral region  Other low back pain - Plan: PT plan of care cert/re-cert  Pain in left leg - Plan: PT plan of care cert/re-cert  Muscle weakness (generalized) - Plan: PT plan of care cert/re-cert  Other abnormalities of gait and mobility - Plan: PT plan of care cert/re-cert  ONSET DATE: ~1 month ago  SUBJECTIVE:  SUBJECTIVE STATEMENT: Pt reports severe sciatica and L LE pain with toe N/T. She notes no method of injury but a slow onset. This has slightly improved the last couple of days. Has been seeing chiropractor for ~1 week. Has had history of piriformis/sciatic nerve issues. Had tried TPDN and traction. Has tried exercise including yoga and was given exercises for piriformis syndrome. She feels that it's only more recently that she has found a mix of medications that have better controlled her pain.   PERTINENT HISTORY:  History of sciatica  PAIN:  Are you having pain? Yes: NPRS scale: currently 6 or 7; at worst 8 or 9/10 Pain location: pain from spine to LE Pain description: Lower leg pain, tightness in back of leg Aggravating factors: Prolonged sitting or standing Relieving factors: pain medication, ice  PRECAUTIONS: None  WEIGHT BEARING RESTRICTIONS: No  FALLS:  Has patient fallen in last 6 months? No  LIVING ENVIRONMENT: Lives with: lives with their spouse Lives in: House/apartment Stairs: Yes: Internal: 2 sets of steps; on right going up Has following equipment at home: None  OCCUPATION: HR consultant -- stand up desk, tries to walk  PLOF: Independent  PATIENT  GOALS: Improve pain for mobility  NEXT MD VISIT: 10/14/22 with Dr. Karie Schwalbe  OBJECTIVE:   DIAGNOSTIC FINDINGS:  Lumbar MRI 09/07/22 IMPRESSION: 1. Lumbar spine degeneration especially affecting facets with mild anterolisthesis at L3-4 and below. 2. L5-S1 12 mm synovial cyst at the left subarticular recess with left S1 impingement. 3. L4-5 moderate spinal stenosis. 4. No L4 nerve root compression noted.  PATIENT SURVEYS:  FOTO 54; predicted 35  SCREENING FOR RED FLAGS: Bowel or bladder incontinence: No Spinal tumors: No Cauda equina syndrome: No Compression fracture: No Abdominal aneurysm: No  COGNITION: Overall cognitive status: Within functional limits for tasks assessed     SENSATION: N/T L toes 1-2  MUSCLE LENGTH: Hamstrings: Right 80 deg; Left ~45 deg Thomas test: Did not assess  POSTURE: weight shift right, trunk slightly laterally flexed to left  PALPATION: Highly TTP in L glute med/max, piriformis, QL and lumbar paraspinals compared to R  LUMBAR ROM:   AROM eval  Flexion 60% painful on return to standing  Extension 20% pain  Right lateral flexion 100%  Left lateral flexion 100%  Right rotation 100%  Left rotation 100%   (Blank rows = not tested)  LOWER EXTREMITY ROM:     Active  Right eval Left eval  Hip flexion    Hip extension    Hip abduction    Hip adduction    Hip internal rotation    Hip external rotation    Knee flexion    Knee extension    Ankle dorsiflexion    Ankle plantarflexion    Ankle inversion    Ankle eversion     (Blank rows = not tested)  LOWER EXTREMITY MMT:    MMT Right eval Left eval  Hip flexion 5 5  Hip extension 4+ 3-  Hip abduction 4+ 3-  Hip adduction    Hip internal rotation 5 5  Hip external rotation 4+ 3+  Knee flexion 5 4  Knee extension 5 5  Ankle dorsiflexion    Ankle plantarflexion    Ankle inversion    Ankle eversion     (Blank rows = not tested)  LUMBAR SPECIAL TESTS:  Straight leg raise test:  Positive, Slump test: Positive, and FABER test: Negative  FUNCTIONAL TESTS:  TBA  GAIT: Distance walked: 100' Assistive device  utilized: None Level of assistance: Complete Independence Comments: Mildly antalgic with L lateral lean  TODAY'S TREATMENT:                                                                                                                              DATE: 09/11/22 See HEP below    PATIENT EDUCATION:  Education details: Exam findings, POC, initial HEP Person educated: Patient Education method: Explanation, Demonstration, and Handouts Education comprehension: verbalized understanding, returned demonstration, and needs further education  HOME EXERCISE PROGRAM: Access Code: [email or web address] URL: [email or web address] Date: 09/11/2022 Prepared by: Vernon Prey Kristine Kirstie Peri  Exercises - Supine Hamstring Stretch with Strap  - 1 x daily - 7 x weekly - 2 sets - 30 sec hold - Supine ITB Stretch with Strap  - 1 x daily - 7 x weekly - 2 sets - 30 sec hold - Supine Double Knee to Chest  - 1 x daily - 7 x weekly - 2 sets - 30 sec hold - Supine Lower Trunk Rotation  - 1 x daily - 7 x weekly - 1 sets - 5 reps - 10 sec hold - Left Standing Lateral Shift Correction at Wall - Repetitions  - 1 x daily - 7 x weekly - 1 sets - 5 reps - 10 sec hold - Standing 'L' Stretch at Counter  - 1 x daily - 7 x weekly - 2 sets - 30 sec hold  ASSESSMENT:  CLINICAL IMPRESSION: Patient is a 67 y.o. F who was seen today for physical therapy evaluation and treatment for low back and left LE pain. MRI demonstrates cyst impinging on S1. Assessment significant for increased L LE neural tension with multiple muscles in spasm in lumbar paraspinals and glutes likely causing increased tension leading to her radicular symptoms. Pt with notable L glute weakness and decreased core strength. She will benefit from PT to address these deficits to decrease pain and improve overall mobility.    OBJECTIVE IMPAIRMENTS: Abnormal gait, decreased activity tolerance, decreased balance, decreased endurance, decreased mobility, difficulty walking, decreased ROM, decreased strength, increased fascial restrictions, increased muscle spasms, impaired sensation, improper body mechanics, postural dysfunction, and pain.   ACTIVITY LIMITATIONS: lifting, bending, sitting, standing, squatting, bed mobility, and locomotion level  PARTICIPATION LIMITATIONS: meal prep, cleaning, laundry, community activity, and occupation  PERSONAL FACTORS: Age, Fitness, Past/current experiences, Profession, and Time since onset of injury/illness/exacerbation are also affecting patient's functional outcome.   REHAB POTENTIAL: Good  CLINICAL DECISION MAKING: Evolving/moderate complexity  EVALUATION COMPLEXITY: Moderate   GOALS: Goals reviewed with patient? Yes  SHORT TERM GOALS: Target date: 10/09/2022   Pt will be ind with initial HEP Baseline: Goal status: INITIAL  2.  Pt will be able to perform all lumbar ROM without pain Baseline:  Goal status: INITIAL  LONG TERM GOALS: Target date: 11/06/2022   Pt will be ind with management and progression of HEP Baseline:  Goal status: INITIAL  2.  Pt will be  able to tolerate sitting and standing for at least 30 min for work and home tasks Baseline:  Goal status: INITIAL  3.  Pt will report >/=50% improvement in pain Baseline:  Goal status: INITIAL  4.  Pt will have increased FOTO score to >/=68 Baseline:  Goal status: INITIAL   PLAN:  PT FREQUENCY: 1x/week  PT DURATION: 8 weeks  PLANNED INTERVENTIONS: Therapeutic exercises, Therapeutic activity, Neuromuscular re-education, Balance training, Gait training, Patient/Family education, Self Care, Joint mobilization, Stair training, Aquatic Therapy, Dry Needling, Electrical stimulation, Spinal mobilization, Cryotherapy, Moist heat, Taping, Traction, Ionotophoresis 4mg /ml Dexamethasone, Manual therapy,  and Re-evaluation.  PLAN FOR NEXT SESSION: Assess response to HEP. Initiate core strengthening. Continue glute strengthening. Manual work or TPDN as indicated (may need to further educate pt on this). TENS?   Kristine Bowman Kristine Bowman, PT 09/11/2022, 8:21 AM

## 2022-09-10 NOTE — Addendum Note (Signed)
Addended by: Monica Becton on: 09/10/2022 12:37 PM   Modules accepted: Orders

## 2022-09-10 NOTE — Telephone Encounter (Signed)
I just ordered those injections today, so [facility] will give her a call, if she has not heard anything by the end of this week let me know.

## 2022-09-11 NOTE — Telephone Encounter (Signed)
Patient notified

## 2022-09-12 ENCOUNTER — Telehealth: Payer: Self-pay | Admitting: Sports Medicine

## 2022-09-12 NOTE — Telephone Encounter (Signed)
Hoping for both at the same time

## 2022-09-12 NOTE — Telephone Encounter (Signed)
Patient called and would like to know if the nerve block be done first or the aspiration of her lower back please advise

## 2022-09-13 ENCOUNTER — Ambulatory Visit
Admission: RE | Admit: 2022-09-13 | Discharge: 2022-09-13 | Disposition: A | Discharge status: Home / Self Care | Payer: PPO | Source: Ambulatory Visit | Attending: Sports Medicine | Admitting: Sports Medicine

## 2022-09-13 ENCOUNTER — Encounter: Payer: Self-pay | Admitting: Sports Medicine

## 2022-09-13 DIAGNOSIS — M5416 Radiculopathy, lumbar region: Secondary | ICD-10-CM

## 2022-09-13 MED ORDER — IOPAMIDOL (ISOVUE-M 200) INJECTION 41%
1.0000 mL | Freq: Once | INTRAMUSCULAR | Status: AC
  Administered 2022-09-13: 1 mL via EPIDURAL

## 2022-09-13 MED ORDER — METHYLPREDNISOLONE ACETATE 40 MG/ML INJ SUSP (RADIOLOG
80.0000 mg | Freq: Once | INTRAMUSCULAR | Status: AC
  Administered 2022-09-13: 80 mg via EPIDURAL

## 2022-09-13 NOTE — Discharge Instructions (Signed)

## 2022-09-16 ENCOUNTER — Encounter: Payer: Self-pay | Admitting: Sports Medicine

## 2022-09-20 ENCOUNTER — Other Ambulatory Visit: Payer: Self-pay | Admitting: Family Medicine

## 2022-09-24 ENCOUNTER — Encounter: Payer: Self-pay | Admitting: Physical Therapy

## 2022-09-24 ENCOUNTER — Ambulatory Visit: Payer: PPO | Admitting: Physical Therapy

## 2022-09-24 DIAGNOSIS — M5459 Other low back pain: Secondary | ICD-10-CM

## 2022-09-24 DIAGNOSIS — M79605 Pain in left leg: Secondary | ICD-10-CM

## 2022-09-24 DIAGNOSIS — R2689 Other abnormalities of gait and mobility: Secondary | ICD-10-CM

## 2022-09-24 DIAGNOSIS — M6281 Muscle weakness (generalized): Secondary | ICD-10-CM

## 2022-09-24 DIAGNOSIS — M5417 Radiculopathy, lumbosacral region: Secondary | ICD-10-CM

## 2022-09-24 NOTE — Therapy (Signed)
OUTPATIENT PHYSICAL THERAPY THORACOLUMBAR TREATMENT   Patient Name: Kristine Bowman MRN: [id number] DOB:1955-07-06, 67 y.o., female Today's Date: 09/24/2022  END OF SESSION:  PT End of Session - 09/24/22 1107     Visit Number 2    Number of Visits 8    Date for PT Re-Evaluation 11/06/22    Authorization Type [employer]    PT Start Time 1107    PT Stop Time 1145    PT Time Calculation (min) 38 min    Activity Tolerance Patient tolerated treatment well    Behavior During Therapy [facility] for tasks assessed/performed              Past Medical History:  Diagnosis Date   KNEE PAIN 06/26/2010   Qualifier: Diagnosis of  By: Linford Arnold MD, Catherine     Right calcaneal fracture    SHOULDER PAIN, RIGHT 07/09/2010   Qualifier: Diagnosis of  By: Linford Arnold MD, Santina Evans     Past Surgical History:  Procedure Laterality Date   COLONOSCOPY     ORIF CALCANEOUS FRACTURE Right 07/27/2020   Procedure: OPEN REDUCTION INTERNAL FIXATION (ORIF) CALCANEOUS FRACTURE;  Surgeon: Toni Arthurs, MD;  Location: [facility];  Service: Orthopedics;  Laterality: Right;    Patient Active Problem List   Diagnosis Date Noted   Left lumbar radiculitis 08/28/2022   DDD (degenerative disc disease), lumbar 08/28/2022   Acute left-sided low back pain with left-sided sciatica 08/26/2022   Left leg swelling 05/30/2021   Bacterial vaginosis 05/22/2021   AKI (acute kidney injury) (HCC) 04/16/2021   Acute bilateral low back pain without sciatica 04/10/2021   Bilateral lower abdominal pain 04/10/2021   Atypical chest pain 04/10/2021   Epigastric pain 04/10/2021   Dysuria 04/10/2021   Hematuria 04/06/2021   Vaginal irritation 04/06/2021   Osteopenia 11/09/2020   CKD (chronic kidney disease) stage 3, GFR 30-59 ml/min (HCC) 09/13/2020   Post-menopausal 09/13/2020   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   SHOULDER PAIN, RIGHT 07/09/2010   KNEE PAIN 06/26/2010    PCP: Nani Gasser, MD  REFERRING PROVIDER: Rodney Langton, MD  REFERRING DIAG: M54.16 (ICD-10-CM) - Left lumbar radiculitis   Rationale for Evaluation and Treatment: Rehabilitation  THERAPY DIAG:  Radiculopathy, lumbosacral region  Other low back pain  Pain in left leg  Muscle weakness (generalized)  Other abnormalities of gait and mobility  ONSET DATE: ~1 month ago  SUBJECTIVE:  SUBJECTIVE STATEMENT: Pt reports she has tried walking and did a spin class. Has been feeling pretty good since injection and cyst aspiration. Pt does report 1 fall in the sand.   PERTINENT HISTORY:  History of sciatica  PAIN:  Are you having pain? Yes: NPRS scale: currently 0/10 Pain location: pain from spine to LE Pain description: Lower leg pain, tightness in back of leg Aggravating factors: Prolonged sitting or standing Relieving factors: pain medication, ice  PRECAUTIONS: None  WEIGHT BEARING RESTRICTIONS: No  FALLS:  Has patient fallen in last 6 months? No  LIVING ENVIRONMENT: Lives with: lives with their spouse Lives in: House/apartment Stairs: Yes: Internal: 2 sets of steps; on right going up Has following equipment at home: None  OCCUPATION: HR consultant -- stand up desk, tries to walk  PLOF: Independent  PATIENT GOALS: Improve pain for mobility  NEXT MD VISIT: 10/14/22 with Dr. Karie Schwalbe  OBJECTIVE:   DIAGNOSTIC FINDINGS:  Lumbar MRI 09/07/22 IMPRESSION: 1. Lumbar spine degeneration especially affecting facets with mild anterolisthesis at L3-4 and below. 2. L5-S1 12 mm synovial cyst at the left subarticular recess with left S1 impingement. 3. L4-5 moderate spinal stenosis. 4. No L4 nerve root compression noted.  PATIENT SURVEYS:  FOTO 54; predicted 70   SENSATION: N/T L toes 1-2  MUSCLE  LENGTH: Hamstrings: Right 80 deg; Left ~45 deg Thomas test: Did not assess  POSTURE: weight shift right, trunk slightly laterally flexed to left  PALPATION: Highly TTP in L glute med/max, piriformis, QL and lumbar paraspinals compared to R  LUMBAR ROM:   AROM eval  Flexion 60% painful on return to standing  Extension 20% pain  Right lateral flexion 100%  Left lateral flexion 100%  Right rotation 100%  Left rotation 100%   (Blank rows = not tested)  LOWER EXTREMITY ROM:     Active  Right eval Left eval  Hip flexion    Hip extension    Hip abduction    Hip adduction    Hip internal rotation    Hip external rotation    Knee flexion    Knee extension    Ankle dorsiflexion    Ankle plantarflexion    Ankle inversion    Ankle eversion     (Blank rows = not tested)  LOWER EXTREMITY MMT:    MMT Right eval Left eval  Hip flexion 5 5  Hip extension 4+ 3-  Hip abduction 4+ 3-  Hip adduction    Hip internal rotation 5 5  Hip external rotation 4+ 3+  Knee flexion 5 4  Knee extension 5 5  Ankle dorsiflexion    Ankle plantarflexion    Ankle inversion    Ankle eversion     (Blank rows = not tested)  LUMBAR SPECIAL TESTS:  Straight leg raise test: Positive, Slump test: Positive, and FABER test: Negative  FUNCTIONAL TESTS:  TBA  GAIT: Distance walked: 100' Assistive device utilized: None Level of assistance: Complete Independence Comments: Mildly antalgic with L lateral lean  TODAY'S TREATMENT:      [facility] Adult PT Treatment:                                                DATE: 09/24/22 Therapeutic Exercise: Supine hamstring stretch with strap 2x30 sec Supine ITB stretch with strap 2x30 sec Supine LTR 10x5 sec  Supine  PPT + marching 2x10 S/L clamshell 2x10 Supine bridge 2x10 Standing gastroc stretch x 30 sec Standing soleus stretch x 30 sec Wall slide 2x10 Captain morgan 5x5 sec                                                                                                                            DATE: 09/11/22 See HEP below    PATIENT EDUCATION:  Education details: Exam findings, POC, initial HEP Person educated: Patient Education method: Explanation, Demonstration, and Handouts Education comprehension: verbalized understanding, returned demonstration, and needs further education  HOME EXERCISE PROGRAM: Access Code: [email or web address] URL: [email or web address] Date: 09/11/2022 Prepared by: Vernon Prey April Kirstie Peri  Exercises - Supine Hamstring Stretch with Strap  - 1 x daily - 7 x weekly - 2 sets - 30 sec hold - Supine ITB Stretch with Strap  - 1 x daily - 7 x weekly - 2 sets - 30 sec hold - Supine Double Knee to Chest  - 1 x daily - 7 x weekly - 2 sets - 30 sec hold - Supine Lower Trunk Rotation  - 1 x daily - 7 x weekly - 1 sets - 5 reps - 10 sec hold - Left Standing Lateral Shift Correction at Wall - Repetitions  - 1 x daily - 7 x weekly - 1 sets - 5 reps - 10 sec hold - Standing 'L' Stretch at Counter  - 1 x daily - 7 x weekly - 2 sets - 30 sec hold  ASSESSMENT:  CLINICAL IMPRESSION: Pt with improved pain since injection and cyst aspiration. Addressed postural abnormalities this session during wall squats (tends to demo L hip shift and increased R LE weight shift). Worked on strengthening L glute and LE stability. Pt tolerated session well.    GOALS: Goals reviewed with patient? Yes  SHORT TERM GOALS: Target date: 10/09/2022   Pt will be ind with initial HEP Baseline: Goal status: INITIAL  2.  Pt will be able to perform all lumbar ROM without pain Baseline:  Goal status: INITIAL  LONG TERM GOALS: Target date: 11/06/2022   Pt will be ind with management and progression of HEP Baseline:  Goal status: INITIAL  2.  Pt will be able to tolerate sitting and standing for at least 30 min for work and home tasks Baseline:  Goal status: INITIAL  3.  Pt will report >/=50% improvement in pain Baseline:  Goal  status: INITIAL  4.  Pt will have increased FOTO score to >/=68 Baseline:  Goal status: INITIAL   PLAN:  PT FREQUENCY: 1x/week  PT DURATION: 8 weeks  PLANNED INTERVENTIONS: Therapeutic exercises, Therapeutic activity, Neuromuscular re-education, Balance training, Gait training, Patient/Family education, Self Care, Joint mobilization, Stair training, Aquatic Therapy, Dry Needling, Electrical stimulation, Spinal mobilization, Cryotherapy, Moist heat, Taping, Traction, Ionotophoresis 4mg /ml Dexamethasone, Manual therapy, and Re-evaluation.  PLAN FOR NEXT SESSION: Assess response to HEP. Initiate core strengthening. Continue glute strengthening. Manual work or TPDN as indicated (  may need to further educate pt on this). TENS?   Eduar Kumpf April Ma L Floyd Wade, PT 09/24/2022, 11:48 AM

## 2022-10-01 ENCOUNTER — Ambulatory Visit: Payer: PPO | Attending: Sports Medicine | Admitting: Physical Therapy

## 2022-10-01 ENCOUNTER — Encounter: Payer: Self-pay | Admitting: Physical Therapy

## 2022-10-01 DIAGNOSIS — M79605 Pain in left leg: Secondary | ICD-10-CM | POA: Diagnosis present

## 2022-10-01 DIAGNOSIS — M5417 Radiculopathy, lumbosacral region: Secondary | ICD-10-CM

## 2022-10-01 DIAGNOSIS — R2689 Other abnormalities of gait and mobility: Secondary | ICD-10-CM

## 2022-10-01 DIAGNOSIS — M6281 Muscle weakness (generalized): Secondary | ICD-10-CM

## 2022-10-01 DIAGNOSIS — M5459 Other low back pain: Secondary | ICD-10-CM

## 2022-10-01 NOTE — Therapy (Signed)
OUTPATIENT PHYSICAL THERAPY THORACOLUMBAR TREATMENT   Patient Name: Kristine Bowman MRN: [id number] DOB:1956/01/20, 67 y.o., female Today's Date: 10/01/2022  END OF SESSION:  PT End of Session - 10/01/22 1018     Visit Number 3    Number of Visits 8    Date for PT Re-Evaluation 11/06/22    Authorization Type [employer]    PT Start Time 1018    PT Stop Time 1100    PT Time Calculation (min) 42 min    Activity Tolerance Patient tolerated treatment well    Behavior During Therapy WFL for tasks assessed/performed              Past Medical History:  Diagnosis Date   KNEE PAIN 06/26/2010   Qualifier: Diagnosis of  By: Linford Arnold MD, Catherine     Right calcaneal fracture    SHOULDER PAIN, RIGHT 07/09/2010   Qualifier: Diagnosis of  By: Linford Arnold MD, Santina Evans     Past Surgical History:  Procedure Laterality Date   COLONOSCOPY     ORIF CALCANEOUS FRACTURE Right 07/27/2020   Procedure: OPEN REDUCTION INTERNAL FIXATION (ORIF) CALCANEOUS FRACTURE;  Surgeon: Toni Arthurs, MD;  [facility];  Service: Orthopedics;  Laterality: Right;    Patient Active Problem List   Diagnosis Date Noted   Left lumbar radiculitis 08/28/2022   DDD (degenerative disc disease), lumbar 08/28/2022   Acute left-sided low back pain with left-sided sciatica 08/26/2022   Left leg swelling 05/30/2021   Bacterial vaginosis 05/22/2021   AKI (acute kidney injury) (HCC) 04/16/2021   Acute bilateral low back pain without sciatica 04/10/2021   Bilateral lower abdominal pain 04/10/2021   Atypical chest pain 04/10/2021   Epigastric pain 04/10/2021   Dysuria 04/10/2021   Hematuria 04/06/2021   Vaginal irritation 04/06/2021   Osteopenia 11/09/2020   CKD (chronic kidney disease) stage 3, GFR 30-59 ml/min (HCC) 09/13/2020   Post-menopausal 09/13/2020   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   SHOULDER PAIN, RIGHT 07/09/2010   KNEE PAIN 06/26/2010    PCP: Nani Gasser, MD  REFERRING PROVIDER: Rodney Langton, MD  REFERRING DIAG: M54.16 (ICD-10-CM) - Left lumbar radiculitis   Rationale for Evaluation and Treatment: Rehabilitation  THERAPY DIAG:  Radiculopathy, lumbosacral region  Other low back pain  Pain in left leg  Muscle weakness (generalized)  Other abnormalities of gait and mobility  ONSET DATE: ~1 month ago  SUBJECTIVE:  SUBJECTIVE STATEMENT: Pt reports she was able to do spin class. States she has been walking multiple times throughout the day. Has been consistent with her HEP. Primarily feeling it on the side of her lower leg. Numbness has slowly decreased.   PERTINENT HISTORY:  History of sciatica  PAIN:  Are you having pain? Yes: NPRS scale: currently 0/10 Pain location: pain from spine to LE Pain description: Lower leg pain, tightness in back of leg Aggravating factors: Prolonged sitting or standing Relieving factors: pain medication, ice  PRECAUTIONS: None  WEIGHT BEARING RESTRICTIONS: No  FALLS:  Has patient fallen in last 6 months? No  LIVING ENVIRONMENT: Lives with: lives with their spouse Lives in: House/apartment Stairs: Yes: Internal: 2 sets of steps; on right going up Has following equipment at home: None  OCCUPATION: HR consultant -- stand up desk, tries to walk  PLOF: Independent  PATIENT GOALS: Improve pain for mobility  NEXT MD VISIT: 10/14/22 with Dr. Karie Schwalbe  OBJECTIVE:   DIAGNOSTIC FINDINGS:  Lumbar MRI 09/07/22 IMPRESSION: 1. Lumbar spine degeneration especially affecting facets with mild anterolisthesis at L3-4 and below. 2. L5-S1 12 mm synovial cyst at the left subarticular recess with left S1 impingement. 3. L4-5 moderate spinal stenosis. 4. No L4 nerve root compression noted.  PATIENT SURVEYS:   FOTO 54; predicted 65   SENSATION: N/T L toes 1-2  MUSCLE LENGTH: Hamstrings: Right 80 deg; Left ~45 deg Thomas test: Did not assess  POSTURE: weight shift right, trunk slightly laterally flexed to left  PALPATION: Highly TTP in L glute med/max, piriformis, QL and lumbar paraspinals compared to R  LUMBAR ROM:   AROM eval  Flexion 60% painful on return to standing  Extension 20% pain  Right lateral flexion 100%  Left lateral flexion 100%  Right rotation 100%  Left rotation 100%   (Blank rows = not tested)  LOWER EXTREMITY ROM:     Active  Right eval Left eval  Hip flexion    Hip extension    Hip abduction    Hip adduction    Hip internal rotation    Hip external rotation    Knee flexion    Knee extension    Ankle dorsiflexion    Ankle plantarflexion    Ankle inversion    Ankle eversion     (Blank rows = not tested)  LOWER EXTREMITY MMT:    MMT Right eval Left eval  Hip flexion 5 5  Hip extension 4+ 3-  Hip abduction 4+ 3-  Hip adduction    Hip internal rotation 5 5  Hip external rotation 4+ 3+  Knee flexion 5 4  Knee extension 5 5  Ankle dorsiflexion    Ankle plantarflexion    Ankle inversion    Ankle eversion     (Blank rows = not tested)  LUMBAR SPECIAL TESTS:  Straight leg raise test: Positive, Slump test: Positive, and FABER test: Negative  FUNCTIONAL TESTS:  TBA  GAIT: Distance walked: 100' Assistive device utilized: None Level of assistance: Complete Independence Comments: Mildly antalgic with L lateral lean  TODAY'S TREATMENT:    [facility] Adult PT Treatment:                                                DATE: 10/01/22 Therapeutic Exercise: Treadmill 2.5 mph x 5 min Standing gastroc stretch  x 30  sec Standing soleus stretch x 30 sec Seated fibularis stretch 2x30 sec Long sitting peroneal nerve glide x10 Seated hamstring stretch 2x30 sec Seated piriformis stretch x30 sec Seated ankle eversion green TB 2x10 Seated ankle inversion  green TB 2x10 Side stepping green TB around ankles 2x10 Backwards monster walk green TB 2x10 Palloff press green TB 10x5 sec Self Care: Self massage through peroneals     [facility] Adult PT Treatment:                                                DATE: 09/24/22 Therapeutic Exercise: Supine hamstring stretch with strap 2x30 sec Supine ITB stretch with strap 2x30 sec Supine LTR 10x5 sec  Supine PPT + marching 2x10 S/L clamshell 2x10 Supine bridge 2x10 Standing gastroc stretch x 30 sec Standing soleus stretch x 30 sec Wall slide 2x10 Captain morgan 5x5 sec                                                                                                                           DATE: 09/11/22 See HEP below    PATIENT EDUCATION:  Education details: Exam findings, POC, initial HEP Person educated: Patient Education method: Explanation, Demonstration, and Handouts Education comprehension: verbalized understanding, returned demonstration, and needs further education  HOME EXERCISE PROGRAM: Access Code: [email or web address] URL: [email or web address] Date: 09/11/2022 Prepared by: Vernon Prey April Kirstie Peri  Exercises - Supine Hamstring Stretch with Strap  - 1 x daily - 7 x weekly - 2 sets - 30 sec hold - Supine ITB Stretch with Strap  - 1 x daily - 7 x weekly - 2 sets - 30 sec hold - Supine Double Knee to Chest  - 1 x daily - 7 x weekly - 2 sets - 30 sec hold - Supine Lower Trunk Rotation  - 1 x daily - 7 x weekly - 1 sets - 5 reps - 10 sec hold - Left Standing Lateral Shift Correction at Wall - Repetitions  - 1 x daily - 7 x weekly - 1 sets - 5 reps - 10 sec hold - Standing 'L' Stretch at Counter  - 1 x daily - 7 x weekly - 2 sets - 30 sec hold  ASSESSMENT:  CLINICAL IMPRESSION: Pt has been feeling it more along her lateral lower leg -- addressed with nerve flossing, ankle strengthening and exercises focusing on improving lateral stability. Able to tolerate well.     GOALS: Goals reviewed with patient? Yes  SHORT TERM GOALS: Target date: 10/09/2022   Pt will be ind with initial HEP Baseline: Goal status: INITIAL  2.  Pt will be able to perform all lumbar ROM without pain Baseline:  Goal status: INITIAL  LONG TERM GOALS: Target date: 11/06/2022   Pt will be ind with management and progression of HEP Baseline:  Goal status: INITIAL  2.  Pt will be able to tolerate sitting and standing for at least 30 min for work and home tasks Baseline:  Goal status: INITIAL  3.  Pt will report >/=50% improvement in pain Baseline:  Goal status: INITIAL  4.  Pt will have increased FOTO score to >/=68 Baseline:  Goal status: INITIAL   PLAN:  PT FREQUENCY: 1x/week  PT DURATION: 8 weeks  PLANNED INTERVENTIONS: Therapeutic exercises, Therapeutic activity, Neuromuscular re-education, Balance training, Gait training, Patient/Family education, Self Care, Joint mobilization, Stair training, Aquatic Therapy, Dry Needling, Electrical stimulation, Spinal mobilization, Cryotherapy, Moist heat, Taping, Traction, Ionotophoresis 4mg /ml Dexamethasone, Manual therapy, and Re-evaluation.  PLAN FOR NEXT SESSION: Assess response to HEP. Initiate core strengthening. Continue glute strengthening. Manual work or TPDN as indicated (may need to further educate pt on this). TENS?   Kili Gracy April Ma L Jolena Kittle, PT 10/01/2022, 10:18 AM

## 2022-10-08 ENCOUNTER — Ambulatory Visit: Payer: PPO | Admitting: Physical Therapy

## 2022-10-14 ENCOUNTER — Ambulatory Visit (INDEPENDENT_AMBULATORY_CARE_PROVIDER_SITE_OTHER): Payer: PPO | Admitting: Sports Medicine

## 2022-10-14 DIAGNOSIS — M5416 Radiculopathy, lumbar region: Secondary | ICD-10-CM | POA: Diagnosis not present

## 2022-10-14 NOTE — Progress Notes (Signed)
    Procedures performed today:    None.  Independent interpretation of notes and tests performed by another provider:   None.  Brief History, Exam, Impression, and Recommendations:    Left lumbar radiculitis This is a very pleasant 67 year old female, she initially presented with a left lumbar radiculitis in an L4 distribution with L4-L5 DDD on x-rays, she failed some conservative treatment so we obtained an epidural, she did not tolerate gabapentin, she had already had prednisone, she only took a couple of days of Lyrica and then stopped, she told me she had preferred a nonpharmacologic approach.  Ultimately we got an MRI that showed multiple pathologic findings including S1 nerve root impingement, a large left L5-S1 facet synovial cyst impinging into the spinal canal, spinal stenosis L4-L5. We set her up with a left S1 selective epidural as well as a facet synovial cyst aspiration and injection, she did notice good improvement, axial pain has resolved, she still has some radicular symptoms down to the outer couple of toes in the back of the lower leg. As this likely represents persistence of a left S1 radiculitis we will continue treatment, she has about 3 more weeks of physical therapy that she will complete before we consider either restarting Lyrica or proceeding with an additional lumbar epidural. Whether we proceed with a L4-L5 interlaminar, L5-S1 interlaminar, or selective S1 transforaminal will depend on her presentation at the follow-up.  I spent 30 minutes of total time managing this patient today, this includes chart review, face to face, and non-face to face time.  ____________________________________________ [name]. Benjamin Stain, M.D., ABFM., CAQSM., AME. Primary Care and Sports Medicine  [facility]  Adjunct Professor of Family Medicine  [employer]  Restaurant manager, fast food

## 2022-10-14 NOTE — Assessment & Plan Note (Signed)
This is a very pleasant 67 year old female, she initially presented with a left lumbar radiculitis in an L4 distribution with L4-L5 DDD on x-rays, she failed some conservative treatment so we obtained an epidural, she did not tolerate gabapentin, she had already had prednisone, she only took a couple of days of Lyrica and then stopped, she told me she had preferred a nonpharmacologic approach.  Ultimately we got an MRI that showed multiple pathologic findings including S1 nerve root impingement, a large left L5-S1 facet synovial cyst impinging into the spinal canal, spinal stenosis L4-L5. We set her up with a left S1 selective epidural as well as a facet synovial cyst aspiration and injection, she did notice good improvement, axial pain has resolved, she still has some radicular symptoms down to the outer couple of toes in the back of the lower leg. As this likely represents persistence of a left S1 radiculitis we will continue treatment, she has about 3 more weeks of physical therapy that she will complete before we consider either restarting Lyrica or proceeding with an additional lumbar epidural. Whether we proceed with a L4-L5 interlaminar, L5-S1 interlaminar, or selective S1 transforaminal will depend on her presentation at the follow-up.

## 2022-10-15 ENCOUNTER — Encounter: Payer: Self-pay | Admitting: Physical Therapy

## 2022-10-15 ENCOUNTER — Ambulatory Visit: Payer: PPO | Admitting: Physical Therapy

## 2022-10-15 DIAGNOSIS — M5417 Radiculopathy, lumbosacral region: Secondary | ICD-10-CM | POA: Diagnosis not present

## 2022-10-15 DIAGNOSIS — M6281 Muscle weakness (generalized): Secondary | ICD-10-CM

## 2022-10-15 DIAGNOSIS — M79605 Pain in left leg: Secondary | ICD-10-CM

## 2022-10-15 DIAGNOSIS — M5459 Other low back pain: Secondary | ICD-10-CM

## 2022-10-15 DIAGNOSIS — R2689 Other abnormalities of gait and mobility: Secondary | ICD-10-CM

## 2022-10-15 NOTE — Therapy (Signed)
OUTPATIENT PHYSICAL THERAPY THORACOLUMBAR TREATMENT   Patient Name: Kristine Bowman MRN: [id number] DOB:1956/01/05, 67 y.o., female Today's Date: 10/15/2022  END OF SESSION:  PT End of Session - 10/15/22 0854     Visit Number 4    Number of Visits 8    Date for PT Re-Evaluation 11/06/22    Authorization Type [employer]    PT Start Time 0854   late arrival   PT Stop Time 0930    PT Time Calculation (min) 36 min    Activity Tolerance Patient tolerated treatment well    Behavior During Therapy [facility] for tasks assessed/performed               Past Medical History:  Diagnosis Date   KNEE PAIN 06/26/2010   Qualifier: Diagnosis of  By: Linford Arnold MD, Catherine     Right calcaneal fracture    SHOULDER PAIN, RIGHT 07/09/2010   Qualifier: Diagnosis of  By: Linford Arnold MD, Santina Evans     Past Surgical History:  Procedure Laterality Date   COLONOSCOPY     ORIF CALCANEOUS FRACTURE Right 07/27/2020   Procedure: OPEN REDUCTION INTERNAL FIXATION (ORIF) CALCANEOUS FRACTURE;  Surgeon: Toni Arthurs, MD;  Location: [facility];  Service: Orthopedics;  Laterality: Right;    Patient Active Problem List   Diagnosis Date Noted   Left lumbar radiculitis 08/28/2022   DDD (degenerative disc disease), lumbar 08/28/2022   Left leg swelling 05/30/2021   Bacterial vaginosis 05/22/2021   AKI (acute kidney injury) (HCC) 04/16/2021   Bilateral lower abdominal pain 04/10/2021   Atypical chest pain 04/10/2021   Epigastric pain 04/10/2021   Dysuria 04/10/2021   Hematuria 04/06/2021   Vaginal irritation 04/06/2021   Osteopenia 11/09/2020   CKD (chronic kidney disease) stage 3, GFR 30-59 ml/min (HCC) 09/13/2020   Post-menopausal 09/13/2020   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   SHOULDER PAIN, RIGHT 07/09/2010   KNEE PAIN 06/26/2010    PCP: Nani Gasser, MD  REFERRING PROVIDER: Rodney Langton, MD  REFERRING DIAG: M54.16 (ICD-10-CM) - Left lumbar  radiculitis   Rationale for Evaluation and Treatment: Rehabilitation  THERAPY DIAG:  Radiculopathy, lumbosacral region  Other low back pain  Pain in left leg  Muscle weakness (generalized)  Other abnormalities of gait and mobility  ONSET DATE: ~1 month ago  SUBJECTIVE:                                                                                                                                                                                           SUBJECTIVE STATEMENT: Pt states she still feels it in her lateral lower leg mostly  in the mornings. Pt reports she is also seeing the chiropractor. Pt saw Dr T but does not want to have to take medication. Pt reports she does the exercises at least 3-4x during the week.   PERTINENT HISTORY:  History of sciatica  PAIN:  Are you having pain? Yes: NPRS scale: currently 0/10 Pain location: pain from spine to LE Pain description: Lower leg pain, tightness in back of leg Aggravating factors: Prolonged sitting or standing Relieving factors: pain medication, ice  PRECAUTIONS: None  WEIGHT BEARING RESTRICTIONS: No  FALLS:  Has patient fallen in last 6 months? No  LIVING ENVIRONMENT: Lives with: lives with their spouse Lives in: House/apartment Stairs: Yes: Internal: 2 sets of steps; on right going up Has following equipment at home: None  OCCUPATION: HR consultant -- stand up desk, tries to walk  PLOF: Independent  PATIENT GOALS: Improve pain for mobility  NEXT MD VISIT: 10/14/22 with Dr. Karie Schwalbe  OBJECTIVE:   DIAGNOSTIC FINDINGS:  Lumbar MRI 09/07/22 IMPRESSION: 1. Lumbar spine degeneration especially affecting facets with mild anterolisthesis at L3-4 and below. 2. L5-S1 12 mm synovial cyst at the left subarticular recess with left S1 impingement. 3. L4-5 moderate spinal stenosis. 4. No L4 nerve root compression noted.  PATIENT SURVEYS:  FOTO 54; predicted 41   SENSATION: N/T L toes 1-2  MUSCLE LENGTH: Hamstrings:  Right 80 deg; Left ~45 deg Thomas test: Did not assess  POSTURE: weight shift right, trunk slightly laterally flexed to left  PALPATION: Highly TTP in L glute med/max, piriformis, QL and lumbar paraspinals compared to R  LUMBAR ROM:   AROM eval  Flexion 60% painful on return to standing  Extension 20% pain  Right lateral flexion 100%  Left lateral flexion 100%  Right rotation 100%  Left rotation 100%   (Blank rows = not tested)  LOWER EXTREMITY ROM:     Active  Right eval Left eval  Hip flexion    Hip extension    Hip abduction    Hip adduction    Hip internal rotation    Hip external rotation    Knee flexion    Knee extension    Ankle dorsiflexion    Ankle plantarflexion    Ankle inversion    Ankle eversion     (Blank rows = not tested)  LOWER EXTREMITY MMT:    MMT Right eval Left eval  Hip flexion 5 5  Hip extension 4+ 3-  Hip abduction 4+ 3-  Hip adduction    Hip internal rotation 5 5  Hip external rotation 4+ 3+  Knee flexion 5 4  Knee extension 5 5  Ankle dorsiflexion    Ankle plantarflexion    Ankle inversion    Ankle eversion     (Blank rows = not tested)  LUMBAR SPECIAL TESTS:  Straight leg raise test: Positive, Slump test: Positive, and FABER test: Negative  FUNCTIONAL TESTS:  TBA  GAIT: Distance walked: 100' Assistive device utilized: None Level of assistance: Complete Independence Comments: Mildly antalgic with L lateral lean  TODAY'S TREATMENT:    [facility] Adult PT Treatment:                                                DATE: 10/15/22 Therapeutic Exercise: Treadmill 2.0 mph x 5 min Standing ITB stretch against wall x 30 sec Standing hamstring stretch  x 30 sec Standing hip flexor stretch x 30 sec Standing soleus stretch x 30 sec Piriformis stretch x 30 sec Tibial nerve floss x10 Manual Therapy: STM & TPR lateral L calf, glutes/piriformis near origin at sacrum Skilled assessment and palpation for TPDN Trigger Point Dry-Needling   Treatment instructions: Expect mild to moderate muscle soreness. S/S of pneumothorax if dry needled over a lung field, and to seek immediate medical attention should they occur. Patient verbalized understanding of these instructions and education.  Patient Consent Given: Yes Education handout provided: Yes Muscles treated: L glute near sacrum, piriformis near sacrum, L peroneals Electrical stimulation performed: No Parameters: N/A Treatment response/outcome: Twitch response, decreased muscle tension Modalities: Moist heat pack x 10 min at end of session   [facility] Adult PT Treatment:                                                DATE: 10/01/22 Therapeutic Exercise: Treadmill 2.5 mph x 5 min Standing gastroc stretch  x 30 sec Standing soleus stretch x 30 sec Seated fibularis stretch 2x30 sec Long sitting peroneal nerve glide x10 Seated hamstring stretch 2x30 sec Seated piriformis stretch x30 sec Seated ankle eversion green TB 2x10 Seated ankle inversion green TB 2x10 Side stepping green TB around ankles 2x10 Backwards monster walk green TB 2x10 Palloff press green TB 10x5 sec Self Care: Self massage through peroneals     [facility] Adult PT Treatment:                                                DATE: 09/24/22 Therapeutic Exercise: Supine hamstring stretch with strap 2x30 sec Supine ITB stretch with strap 2x30 sec Supine LTR 10x5 sec  Supine PPT + marching 2x10 S/L clamshell 2x10 Supine bridge 2x10 Standing gastroc stretch x 30 sec Standing soleus stretch x 30 sec Wall slide 2x10 Captain morgan 5x5 sec   PATIENT EDUCATION:  Education details: Exam findings, POC, initial HEP Person educated: Patient Education method: Programmer, multimedia, Demonstration, and Handouts Education comprehension: verbalized understanding, returned demonstration, and needs further education  HOME EXERCISE PROGRAM: Access Code: [email or web address] URL: [email or web address] Date: 09/11/2022 Prepared by:  Vernon Prey April Kirstie Peri  Exercises - Supine Hamstring Stretch with Strap  - 1 x daily - 7 x weekly - 2 sets - 30 sec hold - Supine ITB Stretch with Strap  - 1 x daily - 7 x weekly - 2 sets - 30 sec hold - Supine Double Knee to Chest  - 1 x daily - 7 x weekly - 2 sets - 30 sec hold - Supine Lower Trunk Rotation  - 1 x daily - 7 x weekly - 1 sets - 5 reps - 10 sec hold - Left Standing Lateral Shift Correction at Wall - Repetitions  - 1 x daily - 7 x weekly - 1 sets - 5 reps - 10 sec hold - Standing 'L' Stretch at Counter  - 1 x daily - 7 x weekly - 2 sets - 30 sec hold  ASSESSMENT:  CLINICAL IMPRESSION: Performed trial of TPDN through areas of muscle tension that could be causing increased neural tension. Pt tolerated well. Continued stretching and nerve glides.    GOALS: Goals reviewed with patient? Yes  SHORT TERM GOALS: Target date: 10/09/2022   Pt will be ind with initial HEP Baseline: Goal status: INITIAL  2.  Pt will be able to perform all lumbar ROM without pain Baseline:  Goal status: INITIAL  LONG TERM GOALS: Target date: 11/06/2022   Pt will be ind with management and progression of HEP Baseline:  Goal status: INITIAL  2.  Pt will be able to tolerate sitting and standing for at least 30 min for work and home tasks Baseline:  Goal status: INITIAL  3.  Pt will report >/=50% improvement in pain Baseline:  Goal status: INITIAL  4.  Pt will have increased FOTO score to >/=68 Baseline:  Goal status: INITIAL   PLAN:  PT FREQUENCY: 1x/week  PT DURATION: 8 weeks  PLANNED INTERVENTIONS: Therapeutic exercises, Therapeutic activity, Neuromuscular re-education, Balance training, Gait training, Patient/Family education, Self Care, Joint mobilization, Stair training, Aquatic Therapy, Dry Needling, Electrical stimulation, Spinal mobilization, Cryotherapy, Moist heat, Taping, Traction, Ionotophoresis 4mg /ml Dexamethasone, Manual therapy, and Re-evaluation.  PLAN FOR  NEXT SESSION: Assess response to HEP. Continue glute/core strengthening. Manual work or TPDN as indicated. TENS?   Sheresa Cullop April Ma L Lesley Galentine, PT 10/15/2022, 8:54 AM

## 2022-10-22 ENCOUNTER — Encounter (INDEPENDENT_AMBULATORY_CARE_PROVIDER_SITE_OTHER): Payer: PPO | Admitting: Sports Medicine

## 2022-10-22 ENCOUNTER — Encounter: Payer: Self-pay | Admitting: Physician Assistant

## 2022-10-22 DIAGNOSIS — Z636 Dependent relative needing care at home: Secondary | ICD-10-CM

## 2022-10-23 ENCOUNTER — Ambulatory Visit: Payer: PPO | Admitting: Physical Therapy

## 2022-10-23 ENCOUNTER — Encounter: Payer: Self-pay | Admitting: Physical Therapy

## 2022-10-23 DIAGNOSIS — M5417 Radiculopathy, lumbosacral region: Secondary | ICD-10-CM | POA: Diagnosis not present

## 2022-10-23 DIAGNOSIS — R2689 Other abnormalities of gait and mobility: Secondary | ICD-10-CM

## 2022-10-23 DIAGNOSIS — M5459 Other low back pain: Secondary | ICD-10-CM

## 2022-10-23 DIAGNOSIS — M6281 Muscle weakness (generalized): Secondary | ICD-10-CM

## 2022-10-23 DIAGNOSIS — M79605 Pain in left leg: Secondary | ICD-10-CM

## 2022-10-23 MED ORDER — IBUPROFEN 800 MG PO TABS: 800.0000 mg | ORAL_TABLET | Freq: Three times a day (TID) | ORAL | 0 refills | Status: DC | PRN

## 2022-10-23 NOTE — Therapy (Signed)
OUTPATIENT PHYSICAL THERAPY THORACOLUMBAR TREATMENT   Patient Name: Kristine Bowman MRN: [id number] DOB:07-07-1955, 67 y.o., female Today's Date: 10/23/2022  END OF SESSION:  PT End of Session - 10/23/22 0937     Visit Number 5    Number of Visits 8    Date for PT Re-Evaluation 11/06/22    Authorization Type [employer]    PT Start Time 0938    PT Stop Time 1015    PT Time Calculation (min) 37 min    Activity Tolerance Patient tolerated treatment well    Behavior During Therapy [facility] for tasks assessed/performed               Past Medical History:  Diagnosis Date   KNEE PAIN 06/26/2010   Qualifier: Diagnosis of  By: Linford Arnold MD, Catherine     Right calcaneal fracture    SHOULDER PAIN, RIGHT 07/09/2010   Qualifier: Diagnosis of  By: Linford Arnold MD, Santina Evans     Past Surgical History:  Procedure Laterality Date   COLONOSCOPY     ORIF CALCANEOUS FRACTURE Right 07/27/2020   Procedure: OPEN REDUCTION INTERNAL FIXATION (ORIF) CALCANEOUS FRACTURE;  Surgeon: Toni Arthurs, MD;  Location: [facility];  Service: Orthopedics;  Laterality: Right;    Patient Active Problem List   Diagnosis Date Noted   Left lumbar radiculitis 08/28/2022   DDD (degenerative disc disease), lumbar 08/28/2022   Left leg swelling 05/30/2021   Bacterial vaginosis 05/22/2021   AKI (acute kidney injury) (HCC) 04/16/2021   Bilateral lower abdominal pain 04/10/2021   Atypical chest pain 04/10/2021   Epigastric pain 04/10/2021   Dysuria 04/10/2021   Hematuria 04/06/2021   Vaginal irritation 04/06/2021   Osteopenia 11/09/2020   CKD (chronic kidney disease) stage 3, GFR 30-59 ml/min (HCC) 09/13/2020   Post-menopausal 09/13/2020   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   SHOULDER PAIN, RIGHT 07/09/2010   KNEE PAIN 06/26/2010    PCP: Nani Gasser, MD  REFERRING PROVIDER: Rodney Langton, MD  REFERRING DIAG: M54.16 (ICD-10-CM) - Left lumbar radiculitis    Rationale for Evaluation and Treatment: Rehabilitation  THERAPY DIAG:  Radiculopathy, lumbosacral region  Other low back pain  Pain in left leg  Muscle weakness (generalized)  Other abnormalities of gait and mobility  ONSET DATE: ~1 month ago  SUBJECTIVE:                                                                                                                                                                                           SUBJECTIVE STATEMENT: Pt states she is still feeling the pain first thing in the morning. Feels better  after moving. Did feel that needling helped some but was sore for a day and half.   PERTINENT HISTORY:  History of sciatica  PAIN:  Are you having pain? Yes: NPRS scale: currently 0/10 Pain location: pain from spine to LE Pain description: Lower leg pain, tightness in back of leg Aggravating factors: Prolonged sitting or standing Relieving factors: pain medication, ice  PRECAUTIONS: None  WEIGHT BEARING RESTRICTIONS: No  FALLS:  Has patient fallen in last 6 months? No  LIVING ENVIRONMENT: Lives with: lives with their spouse Lives in: House/apartment Stairs: Yes: Internal: 2 sets of steps; on right going up Has following equipment at home: None  OCCUPATION: HR consultant -- stand up desk, tries to walk  PLOF: Independent  PATIENT GOALS: Improve pain for mobility  NEXT MD VISIT: 10/14/22 with Dr. Karie Schwalbe  OBJECTIVE:   DIAGNOSTIC FINDINGS:  Lumbar MRI 09/07/22 IMPRESSION: 1. Lumbar spine degeneration especially affecting facets with mild anterolisthesis at L3-4 and below. 2. L5-S1 12 mm synovial cyst at the left subarticular recess with left S1 impingement. 3. L4-5 moderate spinal stenosis. 4. No L4 nerve root compression noted.  PATIENT SURVEYS:  FOTO 54; predicted 10   SENSATION: N/T L toes 1-2  MUSCLE LENGTH: Hamstrings: Right 80 deg; Left ~45 deg Thomas test: Did not assess  POSTURE: weight shift right, trunk  slightly laterally flexed to left  PALPATION: Highly TTP in L glute med/max, piriformis, QL and lumbar paraspinals compared to R  LUMBAR ROM:   AROM eval  Flexion 60% painful on return to standing  Extension 20% pain  Right lateral flexion 100%  Left lateral flexion 100%  Right rotation 100%  Left rotation 100%   (Blank rows = not tested)  LOWER EXTREMITY ROM:     Active  Right eval Left eval  Hip flexion    Hip extension    Hip abduction    Hip adduction    Hip internal rotation    Hip external rotation    Knee flexion    Knee extension    Ankle dorsiflexion    Ankle plantarflexion    Ankle inversion    Ankle eversion     (Blank rows = not tested)  LOWER EXTREMITY MMT:    MMT Right eval Left eval Left 10/23/22  Hip flexion 5 5   Hip extension 4+ 3- 3  Hip abduction 4+ 3- 3+  Hip adduction     Hip internal rotation 5 5   Hip external rotation 4+ 3+   Knee flexion 5 4   Knee extension 5 5   Ankle dorsiflexion     Ankle plantarflexion     Ankle inversion     Ankle eversion      (Blank rows = not tested)  LUMBAR SPECIAL TESTS:  Straight leg raise test: Positive, Slump test: Positive, and FABER test: Negative  FUNCTIONAL TESTS:  TBA  GAIT: Distance walked: 100' Assistive device utilized: None Level of assistance: Complete Independence Comments: Mildly antalgic with L lateral lean  TODAY'S TREATMENT:    [facility] Adult PT Treatment:                                                DATE: 10/23/22 Therapeutic Exercise: Treadmill 2.0 mph x 5 min Seated figure 4 stretch 2 x 30 sec Seated hamstring stretch x 30 sec Prone press up  on elbows x10 Prone hip extension 2x10 Sidelying hip abduction 2x10 Side plank 3x10 sec forearm/knees Front plank 3x20 sec forearm/knees Quadruped bird dog 2x10 Self Care: Foam roll piriformis and glutes Sleep posture/position   [facility] Adult PT Treatment:                                                DATE: 10/15/22 Therapeutic  Exercise: Treadmill 2.0 mph x 5 min Standing ITB stretch against wall x 30 sec Standing hamstring stretch x 30 sec Standing hip flexor stretch x 30 sec Standing soleus stretch x 30 sec Piriformis stretch x 30 sec Tibial nerve floss x10 Manual Therapy: STM & TPR lateral L calf, glutes/piriformis near origin at sacrum Skilled assessment and palpation for TPDN Trigger Point Dry-Needling  Treatment instructions: Expect mild to moderate muscle soreness. S/S of pneumothorax if dry needled over a lung field, and to seek immediate medical attention should they occur. Patient verbalized understanding of these instructions and education.  Patient Consent Given: Yes Education handout provided: Yes Muscles treated: L glute near sacrum, piriformis near sacrum, L peroneals Electrical stimulation performed: No Parameters: N/A Treatment response/outcome: Twitch response, decreased muscle tension Modalities: Moist heat pack x 10 min at end of session   [facility] Adult PT Treatment:                                                DATE: 10/01/22 Therapeutic Exercise: Treadmill 2.5 mph x 5 min Standing gastroc stretch  x 30 sec Standing soleus stretch x 30 sec Seated fibularis stretch 2x30 sec Long sitting peroneal nerve glide x10 Seated hamstring stretch 2x30 sec Seated piriformis stretch x30 sec Seated ankle eversion green TB 2x10 Seated ankle inversion green TB 2x10 Side stepping green TB around ankles 2x10 Backwards monster walk green TB 2x10 Palloff press green TB 10x5 sec Self Care: Self massage through peroneals     [facility] Adult PT Treatment:                                                DATE: 09/24/22 Therapeutic Exercise: Supine hamstring stretch with strap 2x30 sec Supine ITB stretch with strap 2x30 sec Supine LTR 10x5 sec  Supine PPT + marching 2x10 S/L clamshell 2x10 Supine bridge 2x10 Standing gastroc stretch x 30 sec Standing soleus stretch x 30 sec Wall slide 2x10 Captain morgan  5x5 sec   PATIENT EDUCATION:  Education details: Exam findings, POC, initial HEP Person educated: Patient Education method: Programmer, multimedia, Demonstration, and Handouts Education comprehension: verbalized understanding, returned demonstration, and needs further education  HOME EXERCISE PROGRAM: Access Code: [email or web address] URL: [email or web address] Date: 09/11/2022 Prepared by: Vernon Prey April Kirstie Peri  Exercises - Supine Hamstring Stretch with Strap  - 1 x daily - 7 x weekly - 2 sets - 30 sec hold - Supine ITB Stretch with Strap  - 1 x daily - 7 x weekly - 2 sets - 30 sec hold - Supine Double Knee to Chest  - 1 x daily - 7 x weekly - 2 sets - 30 sec hold - Supine Lower Trunk Rotation  -  1 x daily - 7 x weekly - 1 sets - 5 reps - 10 sec hold - Left Standing Lateral Shift Correction at Wall - Repetitions  - 1 x daily - 7 x weekly - 1 sets - 5 reps - 10 sec hold - Standing 'L' Stretch at Counter  - 1 x daily - 7 x weekly - 2 sets - 30 sec hold  ASSESSMENT:  CLINICAL IMPRESSION: Deferred TPDN today. Pt has met her STGs; however, rechecked pt's strength and although stronger with hip abduction, her glutes remains too weak to stabilize against her own full body weight. Session focused primarily on progressing core and hip strengthening. Pt highly challenged with bird dog. Discussed sleep positioning to decrease morning stiffness.    GOALS: Goals reviewed with patient? Yes  SHORT TERM GOALS: Target date: 10/09/2022   Pt will be ind with initial HEP Baseline: Goal status: MET  2.  Pt will be able to perform all lumbar ROM without pain Baseline:  Goal status: MET  LONG TERM GOALS: Target date: 11/06/2022   Pt will be ind with management and progression of HEP Baseline:  Goal status: INITIAL  2.  Pt will be able to tolerate sitting and standing for at least 30 min for work and home tasks Baseline:  Goal status: INITIAL  3.  Pt will report >/=50% improvement in  pain Baseline:  Goal status: INITIAL  4.  Pt will have increased FOTO score to >/=68 Baseline:  Goal status: INITIAL   PLAN:  PT FREQUENCY: 1x/week  PT DURATION: 8 weeks  PLANNED INTERVENTIONS: Therapeutic exercises, Therapeutic activity, Neuromuscular re-education, Balance training, Gait training, Patient/Family education, Self Care, Joint mobilization, Stair training, Aquatic Therapy, Dry Needling, Electrical stimulation, Spinal mobilization, Cryotherapy, Moist heat, Taping, Traction, Ionotophoresis 4mg /ml Dexamethasone, Manual therapy, and Re-evaluation.  PLAN FOR NEXT SESSION: Assess response to HEP. Continue glute/core strengthening. Manual work or TPDN as indicated. TENS?   Terrance Lanahan April Ma L Mylen Mangan, PT 10/23/2022, 9:38 AM

## 2022-10-30 ENCOUNTER — Encounter: Payer: Self-pay | Admitting: Physical Therapy

## 2022-10-30 ENCOUNTER — Ambulatory Visit: Payer: PPO | Attending: Sports Medicine | Admitting: Physical Therapy

## 2022-10-30 DIAGNOSIS — M5417 Radiculopathy, lumbosacral region: Secondary | ICD-10-CM | POA: Insufficient documentation

## 2022-10-30 DIAGNOSIS — M6281 Muscle weakness (generalized): Secondary | ICD-10-CM | POA: Insufficient documentation

## 2022-10-30 DIAGNOSIS — R2689 Other abnormalities of gait and mobility: Secondary | ICD-10-CM | POA: Diagnosis present

## 2022-10-30 DIAGNOSIS — M5459 Other low back pain: Secondary | ICD-10-CM | POA: Diagnosis present

## 2022-10-30 DIAGNOSIS — M79605 Pain in left leg: Secondary | ICD-10-CM | POA: Diagnosis present

## 2022-10-30 NOTE — Therapy (Signed)
OUTPATIENT PHYSICAL THERAPY THORACOLUMBAR TREATMENT   Patient Name: Kristine Bowman MRN: [id number] DOB:09/11/1955, 67 y.o., female Today's Date: 10/30/2022  END OF SESSION:  PT End of Session - 10/30/22 0848     Visit Number 6    Number of Visits 8    Date for PT Re-Evaluation 11/06/22    Authorization Type [employer]    PT Start Time 0848    PT Stop Time 0930    PT Time Calculation (min) 42 min    Activity Tolerance Patient tolerated treatment well    Behavior During Therapy [facility] for tasks assessed/performed             Past Medical History:  Diagnosis Date   KNEE PAIN 06/26/2010   Qualifier: Diagnosis of  By: Linford Arnold MD, Catherine     Right calcaneal fracture    SHOULDER PAIN, RIGHT 07/09/2010   Qualifier: Diagnosis of  By: Linford Arnold MD, Santina Evans     Past Surgical History:  Procedure Laterality Date   COLONOSCOPY     ORIF CALCANEOUS FRACTURE Right 07/27/2020   Procedure: OPEN REDUCTION INTERNAL FIXATION (ORIF) CALCANEOUS FRACTURE;  Surgeon: Toni Arthurs, MD;  Location: [facility];  Service: Orthopedics;  Laterality: Right;    Patient Active Problem List   Diagnosis Date Noted   Left lumbar radiculitis 08/28/2022   DDD (degenerative disc disease), lumbar 08/28/2022   Left leg swelling 05/30/2021   Bacterial vaginosis 05/22/2021   AKI (acute kidney injury) (HCC) 04/16/2021   Bilateral lower abdominal pain 04/10/2021   Atypical chest pain 04/10/2021   Epigastric pain 04/10/2021   Dysuria 04/10/2021   Hematuria 04/06/2021   Vaginal irritation 04/06/2021   Osteopenia 11/09/2020   CKD (chronic kidney disease) stage 3, GFR 30-59 ml/min (HCC) 09/13/2020   Post-menopausal 09/13/2020   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   SHOULDER PAIN, RIGHT 07/09/2010   KNEE PAIN 06/26/2010    PCP: Nani Gasser, MD  REFERRING PROVIDER: Rodney Langton, MD  REFERRING DIAG: M54.16 (ICD-10-CM) - Left lumbar radiculitis    Rationale for Evaluation and Treatment: Rehabilitation  THERAPY DIAG:  Radiculopathy, lumbosacral region  Other low back pain  Pain in left leg  Muscle weakness (generalized)  Other abnormalities of gait and mobility  ONSET DATE: ~1 month ago  SUBJECTIVE:                                                                                                                                                                                           SUBJECTIVE STATEMENT: Pt reports the last 3-4 days while at [facility] she has felt increase in symptoms. She notes  difficulty performing her exercises due to this recent exacerbation. Unsure what caused it. Reports she has been walking and stretching.   PERTINENT HISTORY:  History of sciatica  PAIN:  Are you having pain? Yes: NPRS scale: currently 0/10 Pain location: pain from spine to LE Pain description: Lower leg pain, tightness in back of leg Aggravating factors: Prolonged sitting or standing Relieving factors: pain medication, ice  PRECAUTIONS: None  WEIGHT BEARING RESTRICTIONS: No  FALLS:  Has patient fallen in last 6 months? No  LIVING ENVIRONMENT: Lives with: lives with their spouse Lives in: House/apartment Stairs: Yes: Internal: 2 sets of steps; on right going up Has following equipment at home: None  OCCUPATION: HR consultant -- stand up desk, tries to walk  PLOF: Independent  PATIENT GOALS: Improve pain for mobility  NEXT MD VISIT:   OBJECTIVE:   DIAGNOSTIC FINDINGS:  Lumbar MRI 09/07/22 IMPRESSION: 1. Lumbar spine degeneration especially affecting facets with mild anterolisthesis at L3-4 and below. 2. L5-S1 12 mm synovial cyst at the left subarticular recess with left S1 impingement. 3. L4-5 moderate spinal stenosis. 4. No L4 nerve root compression noted.  PATIENT SURVEYS:  FOTO 54; predicted 52   SENSATION: N/T L toes 1-2  MUSCLE LENGTH: Hamstrings: Right 80 deg; Left ~45 deg Thomas test: Did not  assess  POSTURE: weight shift right, trunk slightly laterally flexed to left  PALPATION: Highly TTP in L glute med/max, piriformis, QL and lumbar paraspinals compared to R  LUMBAR ROM:   AROM eval  Flexion 60% painful on return to standing  Extension 20% pain  Right lateral flexion 100%  Left lateral flexion 100%  Right rotation 100%  Left rotation 100%   (Blank rows = not tested)  LOWER EXTREMITY ROM:     Active  Right eval Left eval  Hip flexion    Hip extension    Hip abduction    Hip adduction    Hip internal rotation    Hip external rotation    Knee flexion    Knee extension    Ankle dorsiflexion    Ankle plantarflexion    Ankle inversion    Ankle eversion     (Blank rows = not tested)  LOWER EXTREMITY MMT:    MMT Right eval Left eval Left 10/23/22  Hip flexion 5 5   Hip extension 4+ 3- 3  Hip abduction 4+ 3- 3+  Hip adduction     Hip internal rotation 5 5   Hip external rotation 4+ 3+   Knee flexion 5 4   Knee extension 5 5   Ankle dorsiflexion     Ankle plantarflexion     Ankle inversion     Ankle eversion      (Blank rows = not tested)  LUMBAR SPECIAL TESTS:  Straight leg raise test: Positive, Slump test: Positive, and FABER test: Negative  FUNCTIONAL TESTS:  TBA  GAIT: Distance walked: 100' Assistive device utilized: None Level of assistance: Complete Independence Comments: Mildly antalgic with L lateral lean  [facility] Adult PT Treatment:                                                DATE: 10/30/22 Therapeutic Exercise: Prone press up on forearm x10 LTR x10 Supine piriformis stretch x 30 sec Supine figure 4 stretch 2 x30 sec Seated hip flexor stretch x30 sec  Attempted bird dog but too much pain Supine PPT x10 Manual Therapy: Grade II to III hip mobilization for ER, ext and flexion Long arc distraction R hip Skilled assessment and palpation for TPDN Trigger Point Dry-Needling  Treatment instructions: Expect mild to moderate muscle  soreness. S/S of pneumothorax if dry needled over a lung field, and to seek immediate medical attention should they occur. Patient verbalized understanding of these instructions and education.  Patient Consent Given: Yes Education handout provided: Previously provided Muscles treated: L peroneals, L piriformis (along origin near sacrum), L glute Electrical stimulation performed: No Parameters: N/A Treatment response/outcome: Twitch response, decreased muscle tension   [facility] Adult PT Treatment:                                                DATE: 10/23/22 Therapeutic Exercise: Treadmill 2.0 mph x 5 min Seated figure 4 stretch 2 x 30 sec Seated hamstring stretch x 30 sec Prone press up on elbows x10 Prone hip extension 2x10 Sidelying hip abduction 2x10 Side plank 3x10 sec forearm/knees Front plank 3x20 sec forearm/knees Quadruped bird dog 2x10 Self Care: Foam roll piriformis and glutes Sleep posture/position   [facility] Adult PT Treatment:                                                DATE: 10/15/22 Therapeutic Exercise: Treadmill 2.0 mph x 5 min Standing ITB stretch against wall x 30 sec Standing hamstring stretch x 30 sec Standing hip flexor stretch x 30 sec Standing soleus stretch x 30 sec Piriformis stretch x 30 sec Tibial nerve floss x10 Manual Therapy: STM & TPR lateral L calf, glutes/piriformis near origin at sacrum Skilled assessment and palpation for TPDN Trigger Point Dry-Needling  Treatment instructions: Expect mild to moderate muscle soreness. S/S of pneumothorax if dry needled over a lung field, and to seek immediate medical attention should they occur. Patient verbalized understanding of these instructions and education.  Patient Consent Given: Yes Education handout provided: Yes Muscles treated: L glute near sacrum, piriformis near sacrum, L peroneals Electrical stimulation performed: No Parameters: N/A Treatment response/outcome: Twitch response, decreased muscle  tension Modalities: Moist heat pack x 10 min at end of session    PATIENT EDUCATION:  Education details: Exam findings, POC, initial HEP Person educated: Patient Education method: Explanation, Demonstration, and Handouts Education comprehension: verbalized understanding, returned demonstration, and needs further education  HOME EXERCISE PROGRAM: Access Code: [email or web address] URL: [email or web address] Date: 09/11/2022 Prepared by: Vernon Prey April Kirstie Peri  Exercises - Supine Hamstring Stretch with Strap  - 1 x daily - 7 x weekly - 2 sets - 30 sec hold - Supine ITB Stretch with Strap  - 1 x daily - 7 x weekly - 2 sets - 30 sec hold - Supine Double Knee to Chest  - 1 x daily - 7 x weekly - 2 sets - 30 sec hold - Supine Lower Trunk Rotation  - 1 x daily - 7 x weekly - 1 sets - 5 reps - 10 sec hold - Left Standing Lateral Shift Correction at Wall - Repetitions  - 1 x daily - 7 x weekly - 1 sets - 5 reps - 10 sec hold - Standing 'L' Stretch at  Counter  - 1 x daily - 7 x weekly - 2 sets - 30 sec hold  ASSESSMENT:  CLINICAL IMPRESSION: Pt with recent exacerbation. She states she had felt good after last PT session. Performed dry needling and hip mobilizations to try and address pt's feelings of hip tightness/pain. She is unable to demonstrate her normal amount of hip ROM from prior sessions. Primarily focused on gentle stretching as pt was unable to tolerate strengthening. Symptoms eased some after prone press ups and did report a little bit of reprieve after needling. Pt will be seeing Dr. Karie Schwalbe.    GOALS: Goals reviewed with patient? Yes  SHORT TERM GOALS: Target date: 10/09/2022   Pt will be ind with initial HEP Baseline: Goal status: MET  2.  Pt will be able to perform all lumbar ROM without pain Baseline:  Goal status: MET  LONG TERM GOALS: Target date: 11/06/2022   Pt will be ind with management and progression of HEP Baseline:  Goal status: INITIAL  2.  Pt will  be able to tolerate sitting and standing for at least 30 min for work and home tasks Baseline:  Goal status: INITIAL  3.  Pt will report >/=50% improvement in pain Baseline:  Goal status: INITIAL  4.  Pt will have increased FOTO score to >/=68 Baseline:  Goal status: INITIAL   PLAN:  PT FREQUENCY: 1x/week  PT DURATION: 8 weeks  PLANNED INTERVENTIONS: Therapeutic exercises, Therapeutic activity, Neuromuscular re-education, Balance training, Gait training, Patient/Family education, Self Care, Joint mobilization, Stair training, Aquatic Therapy, Dry Needling, Electrical stimulation, Spinal mobilization, Cryotherapy, Moist heat, Taping, Traction, Ionotophoresis 4mg /ml Dexamethasone, Manual therapy, and Re-evaluation.  PLAN FOR NEXT SESSION: Assess response to HEP. Continue glute/core strengthening. Manual work or TPDN as indicated.    Nimra Puccinelli April Ma L Rino Hosea, PT 10/30/2022, 8:48 AM

## 2022-11-04 ENCOUNTER — Telehealth: Payer: PPO | Admitting: Sports Medicine

## 2022-11-04 DIAGNOSIS — M5416 Radiculopathy, lumbar region: Secondary | ICD-10-CM

## 2022-11-04 DIAGNOSIS — M1712 Unilateral primary osteoarthritis, left knee: Secondary | ICD-10-CM | POA: Diagnosis not present

## 2022-11-04 NOTE — Assessment & Plan Note (Signed)
MRI confirmed knee osteoarthritis, she did have an injection with [facility] sometime ago, and with recurrence of pain, I would like to see her on Thursday, and we will consider making the decision to inject her knee at that time.

## 2022-11-04 NOTE — Progress Notes (Signed)
Virtual Visit via WebEx/MyChart   I connected with  Kristine Bowman  on 11/04/22 via WebEx/MyChart/Doximity Video and verified that I am speaking with the correct person using two identifiers.   I discussed the limitations, risks, security and privacy concerns of performing an evaluation and management service by WebEx/MyChart/Doximity Video, including the higher likelihood of inaccurate diagnosis and treatment, and the availability of in person appointments.  We also discussed the likely need of an additional face to face encounter for complete and high quality delivery of care.  I also discussed with the patient that there may be a patient responsible charge related to this service. The patient expressed understanding and wishes to proceed.  Provider location is in medical facility. Patient location is at their home, different from provider location. People involved in care of the patient during this telehealth encounter were myself, my nurse/medical assistant, and my front office/scheduling team member.  Review of Systems: No fevers, chills, night sweats, weight loss, chest pain, or shortness of breath.   Objective Findings:    General: Speaking full sentences, no audible heavy breathing.  Sounds alert and appropriately interactive.  Appears well.  Face symmetric.  Extraocular movements intact.  Pupils equal and round.  No nasal flaring or accessory muscle use visualized.  Independent interpretation of tests performed by another provider:   None.  Brief History, Exam, Impression, and Recommendations:    Left lumbar radiculitis This is a very pleasant 67 year old female, she initially presented with left lumbar radiculitis L4 distribution, we put her through conservative treatment, she did not tolerate gabapentin and she failed physical therapy, she took a couple of days of Lyrica and stopped. Ultimately an MRI showed multiple pathologic findings including S1 nerve root impingement, there  is also a large left L5-S1 facet synovial cyst impinging into the spinal canal. We set her up with a left selective S1 epidural as well as a facet synovial cyst aspiration and injection, the facet synovial cyst was ruptured and injected, she did notice complete resolution of her axial back pain, the axial back pain continues to be resolved, unfortunately she continues to have radicular symptoms down the left lower leg, back of the left calf to the middle 3 toes consistent with an L5 distribution radiculopathy. She has started Lyrica, 50 mg twice daily, she will go up to 3 times daily, and I am going to set her up with an L5-S1 transforaminal epidural. Return to see me 1 month after the epidural. At follow-up visit we will also consider up titration of her Lyrica.  Primary osteoarthritis of left knee MRI confirmed knee osteoarthritis, she did have an injection with [facility] sometime ago, and with recurrence of pain, I would like to see her on Thursday, and we will consider making the decision to inject her knee at that time.   I discussed the above assessment and treatment plan with the patient. The patient was provided an opportunity to ask questions and all were answered. The patient agreed with the plan and demonstrated an understanding of the instructions.   The patient was advised to call back or seek an in-person evaluation if the symptoms worsen or if the condition fails to improve as anticipated.   I provided 30 minutes of face to face and non-face-to-face time during this encounter date, time was needed to gather information, review chart, records, communicate/coordinate with staff remotely, as well as complete documentation.   ____________________________________________ [name]. Benjamin Stain, M.D., ABFM., CAQSM., AME. Primary Care and Sports Medicine  [facility]  Adjunct Professor of Family Medicine  [employer]  [place]

## 2022-11-04 NOTE — Assessment & Plan Note (Signed)
This is a very pleasant 67 year old female, she initially presented with left lumbar radiculitis L4 distribution, we put her through conservative treatment, she did not tolerate gabapentin and she failed physical therapy, she took a couple of days of Lyrica and stopped. Ultimately an MRI showed multiple pathologic findings including S1 nerve root impingement, there is also a large left L5-S1 facet synovial cyst impinging into the spinal canal. We set her up with a left selective S1 epidural as well as a facet synovial cyst aspiration and injection, the facet synovial cyst was ruptured and injected, she did notice complete resolution of her axial back pain, the axial back pain continues to be resolved, unfortunately she continues to have radicular symptoms down the left lower leg, back of the left calf to the middle 3 toes consistent with an L5 distribution radiculopathy. She has started Lyrica, 50 mg twice daily, she will go up to 3 times daily, and I am going to set her up with an L5-S1 transforaminal epidural. Return to see me 1 month after the epidural. At follow-up visit we will also consider up titration of her Lyrica.

## 2022-11-06 ENCOUNTER — Ambulatory Visit: Payer: PPO | Admitting: Physical Therapy

## 2022-11-06 ENCOUNTER — Encounter: Payer: Self-pay | Admitting: Physical Therapy

## 2022-11-06 DIAGNOSIS — M79605 Pain in left leg: Secondary | ICD-10-CM

## 2022-11-06 DIAGNOSIS — M5417 Radiculopathy, lumbosacral region: Secondary | ICD-10-CM

## 2022-11-06 DIAGNOSIS — R2689 Other abnormalities of gait and mobility: Secondary | ICD-10-CM

## 2022-11-06 DIAGNOSIS — M5459 Other low back pain: Secondary | ICD-10-CM

## 2022-11-06 DIAGNOSIS — M6281 Muscle weakness (generalized): Secondary | ICD-10-CM

## 2022-11-06 NOTE — Therapy (Signed)
OUTPATIENT PHYSICAL THERAPY THORACOLUMBAR TREATMENT   Patient Name: Kristine Bowman MRN: [id number] DOB:19-Sep-1955, 67 y.o., female Today's Date: 11/06/2022  END OF SESSION:  PT End of Session - 11/06/22 1452     Visit Number 7    Number of Visits 8    Date for PT Re-Evaluation 11/06/22    Authorization Type [employer]    PT Start Time 1452    PT Stop Time 1530    PT Time Calculation (min) 38 min    Activity Tolerance Patient tolerated treatment well    Behavior During Therapy [facility] for tasks assessed/performed             Past Medical History:  Diagnosis Date   KNEE PAIN 06/26/2010   Qualifier: Diagnosis of  By: Linford Arnold MD, Catherine     Right calcaneal fracture    SHOULDER PAIN, RIGHT 07/09/2010   Qualifier: Diagnosis of  By: Linford Arnold MD, Santina Evans     Past Surgical History:  Procedure Laterality Date   COLONOSCOPY     ORIF CALCANEOUS FRACTURE Right 07/27/2020   Procedure: OPEN REDUCTION INTERNAL FIXATION (ORIF) CALCANEOUS FRACTURE;  Surgeon: Toni Arthurs, MD;  Location: [facility];  Service: Orthopedics;  Laterality: Right;    Patient Active Problem List   Diagnosis Date Noted   Primary osteoarthritis of left knee 11/04/2022   Left lumbar radiculitis 08/28/2022   DDD (degenerative disc disease), lumbar 08/28/2022   Left leg swelling 05/30/2021   Bacterial vaginosis 05/22/2021   AKI (acute kidney injury) (HCC) 04/16/2021   Bilateral lower abdominal pain 04/10/2021   Atypical chest pain 04/10/2021   Epigastric pain 04/10/2021   Dysuria 04/10/2021   Hematuria 04/06/2021   Vaginal irritation 04/06/2021   Osteopenia 11/09/2020   CKD (chronic kidney disease) stage 3, GFR 30-59 ml/min (HCC) 09/13/2020   Post-menopausal 09/13/2020   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   SHOULDER PAIN, RIGHT 07/09/2010   KNEE PAIN 06/26/2010    PCP: Nani Gasser, MD  REFERRING PROVIDER: Rodney Langton, MD  REFERRING DIAG:  M54.16 (ICD-10-CM) - Left lumbar radiculitis   Rationale for Evaluation and Treatment: Rehabilitation  THERAPY DIAG:  Radiculopathy, lumbosacral region  Other low back pain  Pain in left leg  Muscle weakness (generalized)  Other abnormalities of gait and mobility  ONSET DATE: ~1 month ago  SUBJECTIVE:                                                                                                                                                                                           SUBJECTIVE STATEMENT: Pt states she was able to talk to Dr.  T who is thinking about doing L5 nerve block. Has started lyrica which has helped to manage pain. Pt reports she did feel better 3 days after needling.   PERTINENT HISTORY:  History of sciatica  PAIN:  Are you having pain? Yes: NPRS scale: currently 5-6/10 Pain location: pain from spine to LE Pain description: Lower leg pain, tightness in back of leg Aggravating factors: Prolonged sitting or standing Relieving factors: pain medication, ice  PRECAUTIONS: None  WEIGHT BEARING RESTRICTIONS: No  FALLS:  Has patient fallen in last 6 months? No  LIVING ENVIRONMENT: Lives with: lives with their spouse Lives in: House/apartment Stairs: Yes: Internal: 2 sets of steps; on right going up Has following equipment at home: None  OCCUPATION: HR consultant -- stand up desk, tries to walk  PLOF: Independent  PATIENT GOALS: Improve pain for mobility  NEXT MD VISIT:   OBJECTIVE:   DIAGNOSTIC FINDINGS:  Lumbar MRI 09/07/22 IMPRESSION: 1. Lumbar spine degeneration especially affecting facets with mild anterolisthesis at L3-4 and below. 2. L5-S1 12 mm synovial cyst at the left subarticular recess with left S1 impingement. 3. L4-5 moderate spinal stenosis. 4. No L4 nerve root compression noted.  PATIENT SURVEYS:  FOTO 54; predicted 41   SENSATION: N/T L toes 1-2  MUSCLE LENGTH: Hamstrings: Right 80 deg; Left ~45 deg Thomas test:  Did not assess  POSTURE: weight shift right, trunk slightly laterally flexed to left  PALPATION: Highly TTP in L glute med/max, piriformis, QL and lumbar paraspinals compared to R  LUMBAR ROM:   AROM eval  Flexion 60% painful on return to standing  Extension 20% pain  Right lateral flexion 100%  Left lateral flexion 100%  Right rotation 100%  Left rotation 100%   (Blank rows = not tested)  LOWER EXTREMITY ROM:     Active  Right eval Left eval  Hip flexion    Hip extension    Hip abduction    Hip adduction    Hip internal rotation    Hip external rotation    Knee flexion    Knee extension    Ankle dorsiflexion    Ankle plantarflexion    Ankle inversion    Ankle eversion     (Blank rows = not tested)  LOWER EXTREMITY MMT:    MMT Right eval Left eval Left 10/23/22 Left 11/06/22  Hip flexion 5 5    Hip extension 4+ 3- 3 3  Hip abduction 4+ 3- 3+ 3+  Hip adduction      Hip internal rotation 5 5    Hip external rotation 4+ 3+    Knee flexion 5 4    Knee extension 5 5    Ankle dorsiflexion      Ankle plantarflexion      Ankle inversion      Ankle eversion       (Blank rows = not tested)  LUMBAR SPECIAL TESTS:  Straight leg raise test: Positive, Slump test: Positive, and FABER test: Negative  FUNCTIONAL TESTS:  TBA  GAIT: Distance walked: 100' Assistive device utilized: None Level of assistance: Complete Independence Comments: Mildly antalgic with L lateral lean  [facility] Adult PT Treatment:                                                DATE: 11/06/22 Therapeutic Exercise: Treadmill 2.0 mph x 5  min Seated figure 4 stretch 2x30 sec Prone hip ext 2x10 Bird dog 2x10 Standing fire hydrant red TB 2x10 6" step glute iso press down 2x10 Manual Therapy: UPA/CPAs Sacrum L & R TPR & STM L glute max   [facility] Adult PT Treatment:                                                DATE: 10/30/22 Therapeutic Exercise: Prone press up on forearm x10 LTR x10 Supine  piriformis stretch x 30 sec Supine figure 4 stretch 2 x30 sec Seated hip flexor stretch x30 sec Attempted bird dog but too much pain Supine PPT x10 Manual Therapy: Grade II to III hip mobilization for ER, ext and flexion Long arc distraction R hip Skilled assessment and palpation for TPDN Trigger Point Dry-Needling  Treatment instructions: Expect mild to moderate muscle soreness. S/S of pneumothorax if dry needled over a lung field, and to seek immediate medical attention should they occur. Patient verbalized understanding of these instructions and education.  Patient Consent Given: Yes Education handout provided: Previously provided Muscles treated: L peroneals, L piriformis (along origin near sacrum), L glute Electrical stimulation performed: No Parameters: N/A Treatment response/outcome: Twitch response, decreased muscle tension   [facility] Adult PT Treatment:                                                DATE: 10/23/22 Therapeutic Exercise: Treadmill 2.0 mph x 5 min Seated figure 4 stretch 2 x 30 sec Seated hamstring stretch x 30 sec Prone press up on elbows x10 Prone hip extension 2x10 Sidelying hip abduction 2x10 Side plank 3x10 sec forearm/knees Front plank 3x20 sec forearm/knees Quadruped bird dog 2x10 Self Care: Foam roll piriformis and glutes Sleep posture/position   [facility] Adult PT Treatment:                                                DATE: 10/15/22 Therapeutic Exercise: Treadmill 2.0 mph x 5 min Standing ITB stretch against wall x 30 sec Standing hamstring stretch x 30 sec Standing hip flexor stretch x 30 sec Standing soleus stretch x 30 sec Piriformis stretch x 30 sec Tibial nerve floss x10 Manual Therapy: STM & TPR lateral L calf, glutes/piriformis near origin at sacrum Skilled assessment and palpation for TPDN Trigger Point Dry-Needling  Treatment instructions: Expect mild to moderate muscle soreness. S/S of pneumothorax if dry needled over a lung field,  and to seek immediate medical attention should they occur. Patient verbalized understanding of these instructions and education.  Patient Consent Given: Yes Education handout provided: Yes Muscles treated: L glute near sacrum, piriformis near sacrum, L peroneals Electrical stimulation performed: No Parameters: N/A Treatment response/outcome: Twitch response, decreased muscle tension Modalities: Moist heat pack x 10 min at end of session    PATIENT EDUCATION:  Education details: Exam findings, POC, initial HEP Person educated: Patient Education method: Explanation, Demonstration, and Handouts Education comprehension: verbalized understanding, returned demonstration, and needs further education  HOME EXERCISE PROGRAM: Access Code: [email or web address] URL: [email or web address] Date: 09/11/2022 Prepared by: Vernon Prey April Kirstie Peri  Exercises -  Supine Hamstring Stretch with Strap  - 1 x daily - 7 x weekly - 2 sets - 30 sec hold - Supine ITB Stretch with Strap  - 1 x daily - 7 x weekly - 2 sets - 30 sec hold - Supine Double Knee to Chest  - 1 x daily - 7 x weekly - 2 sets - 30 sec hold - Supine Lower Trunk Rotation  - 1 x daily - 7 x weekly - 1 sets - 5 reps - 10 sec hold - Left Standing Lateral Shift Correction at Wall - Repetitions  - 1 x daily - 7 x weekly - 1 sets - 5 reps - 10 sec hold - Standing 'L' Stretch at Counter  - 1 x daily - 7 x weekly - 2 sets - 30 sec hold  ASSESSMENT:  CLINICAL IMPRESSION: Pt has slowly been meeting her goals. She is able to better tolerate standing and sitting; however, walking remains limited due to intermittent flare ups of her pain. Pt continues to have L glute weakness that requires addressing leading to decreased pelvic stability. Has been feeling better after seeing Dr T and has plans to get an L5 nerve block next week on Thursday to further address her lateral leg pain. Pt will benefit from a few more weeks of PT to address her hip  weakness.    GOALS: Goals reviewed with patient? Yes  SHORT TERM GOALS: Target date: 10/09/2022   Pt will be ind with initial HEP Baseline: Goal status: MET  2.  Pt will be able to perform all lumbar ROM without pain Baseline:  Goal status: MET  LONG TERM GOALS: Target date: 12/18/2022   Pt will be ind with management and progression of HEP Baseline:  Goal status: MET  2.  Pt will be able to tolerate sitting and standing for at least 30 min for work and home tasks Baseline:  Goal status: MET  3.  Pt will report >/=50% improvement in pain Baseline:  11/06/22: ~50% improvement Goal status: IN PROGRESS  4.  Pt will have increased FOTO score to >/=68 Baseline:  Goal status: IN PROGRESS   PLAN:  PT FREQUENCY: 1x/week  PT DURATION: 6 weeks  PLANNED INTERVENTIONS: Therapeutic exercises, Therapeutic activity, Neuromuscular re-education, Balance training, Gait training, Patient/Family education, Self Care, Joint mobilization, Stair training, Aquatic Therapy, Dry Needling, Electrical stimulation, Spinal mobilization, Cryotherapy, Moist heat, Taping, Traction, Ionotophoresis 4mg /ml Dexamethasone, Manual therapy, and Re-evaluation.  PLAN FOR NEXT SESSION: Assess response to HEP. Continue glute/core strengthening. Manual work or TPDN as indicated.    Baily Hovanec April Ma L Thanya Cegielski, PT 11/06/2022, 2:52 PM

## 2022-11-07 ENCOUNTER — Other Ambulatory Visit (INDEPENDENT_AMBULATORY_CARE_PROVIDER_SITE_OTHER): Payer: PPO

## 2022-11-07 ENCOUNTER — Ambulatory Visit (INDEPENDENT_AMBULATORY_CARE_PROVIDER_SITE_OTHER): Payer: PPO | Admitting: Sports Medicine

## 2022-11-07 DIAGNOSIS — M1712 Unilateral primary osteoarthritis, left knee: Secondary | ICD-10-CM

## 2022-11-07 NOTE — Progress Notes (Addendum)
    Procedures performed today:    Procedure: Real-time Ultrasound Guided injection of the left knee Device: Samsung HS60  Verbal informed consent obtained.  Time-out conducted.  Noted no overlying erythema, induration, or other signs of local infection.  Skin prepped in a sterile fashion.  Local anesthesia: Topical Ethyl chloride.  With sterile technique and under real time ultrasound guidance: Moderate joint effusion noted, 1 cc Kenalog 40, 2 cc lidocaine, 2 cc bupivacaine injected easily Completed without difficulty  Advised to call if fevers/chills, erythema, induration, drainage, or persistent bleeding.  Images permanently stored and available for review in PACS.  Impression: Technically successful ultrasound guided injection.  Independent interpretation of notes and tests performed by another provider:   None.  Brief History, Exam, Impression, and Recommendations:    Primary osteoarthritis of left knee MRI confirmed knee osteoarthritis, left knee injection today, return in 6 weeks, if insufficient improvement we will proceed with viscosupplementation.    ____________________________________________ [name]. Benjamin Stain, M.D., ABFM., CAQSM., AME. Primary Care and Sports Medicine [facility]  Adjunct Professor of Family Medicine  [place] [employer] [facility] [employer]  Restaurant manager, fast food

## 2022-11-07 NOTE — Assessment & Plan Note (Signed)
MRI confirmed knee osteoarthritis, left knee injection today, return in 6 weeks, if insufficient improvement we will proceed with viscosupplementation.

## 2022-11-13 ENCOUNTER — Other Ambulatory Visit: Payer: PPO

## 2022-11-13 NOTE — Discharge Instructions (Signed)

## 2022-11-14 ENCOUNTER — Ambulatory Visit
Admission: RE | Admit: 2022-11-14 | Discharge: 2022-11-14 | Disposition: A | Discharge status: Home / Self Care | Payer: PPO | Source: Ambulatory Visit | Attending: Sports Medicine | Admitting: Sports Medicine

## 2022-11-14 DIAGNOSIS — M5416 Radiculopathy, lumbar region: Secondary | ICD-10-CM

## 2022-11-14 MED ORDER — IOPAMIDOL (ISOVUE-M 200) INJECTION 41%
1.0000 mL | Freq: Once | INTRAMUSCULAR | Status: AC
  Administered 2022-11-14: 1 mL via EPIDURAL

## 2022-11-14 MED ORDER — METHYLPREDNISOLONE ACETATE 40 MG/ML INJ SUSP (RADIOLOG
60.0000 mg | Freq: Once | INTRAMUSCULAR | Status: AC
  Administered 2022-11-14: 60 mg via EPIDURAL

## 2022-11-19 ENCOUNTER — Encounter: Payer: Self-pay | Admitting: Physical Therapy

## 2022-11-19 ENCOUNTER — Ambulatory Visit: Payer: PPO | Admitting: Physical Therapy

## 2022-11-19 DIAGNOSIS — M5417 Radiculopathy, lumbosacral region: Secondary | ICD-10-CM | POA: Diagnosis not present

## 2022-11-19 DIAGNOSIS — M79605 Pain in left leg: Secondary | ICD-10-CM

## 2022-11-19 DIAGNOSIS — M5459 Other low back pain: Secondary | ICD-10-CM

## 2022-11-19 DIAGNOSIS — M6281 Muscle weakness (generalized): Secondary | ICD-10-CM

## 2022-11-19 DIAGNOSIS — R2689 Other abnormalities of gait and mobility: Secondary | ICD-10-CM

## 2022-11-19 NOTE — Therapy (Signed)
OUTPATIENT PHYSICAL THERAPY THORACOLUMBAR TREATMENT   Patient Name: Kristine Bowman MRN: [id number] DOB:January 13, 1956, 67 y.o., female Today's Date: 11/19/2022  END OF SESSION:  PT End of Session - 11/19/22 1458     Visit Number 8    Date for PT Re-Evaluation 12/18/22    Authorization Type [employer]    PT Start Time 1455    PT Stop Time 1530    PT Time Calculation (min) 35 min    Activity Tolerance Patient tolerated treatment well    Behavior During Therapy [facility] for tasks assessed/performed             Past Medical History:  Diagnosis Date   KNEE PAIN 06/26/2010   Qualifier: Diagnosis of  By: Linford Arnold MD, Catherine     Right calcaneal fracture    SHOULDER PAIN, RIGHT 07/09/2010   Qualifier: Diagnosis of  By: Linford Arnold MD, Santina Evans     Past Surgical History:  Procedure Laterality Date   COLONOSCOPY     ORIF CALCANEOUS FRACTURE Right 07/27/2020   Procedure: OPEN REDUCTION INTERNAL FIXATION (ORIF) CALCANEOUS FRACTURE;  Surgeon: Toni Arthurs, MD;  Location: [facility];  Service: Orthopedics;  Laterality: Right;    Patient Active Problem List   Diagnosis Date Noted   Primary osteoarthritis of left knee 11/04/2022   Left lumbar radiculitis 08/28/2022   DDD (degenerative disc disease), lumbar 08/28/2022   Left leg swelling 05/30/2021   Bacterial vaginosis 05/22/2021   AKI (acute kidney injury) (HCC) 04/16/2021   Bilateral lower abdominal pain 04/10/2021   Atypical chest pain 04/10/2021   Epigastric pain 04/10/2021   Dysuria 04/10/2021   Hematuria 04/06/2021   Vaginal irritation 04/06/2021   Osteopenia 11/09/2020   CKD (chronic kidney disease) stage 3, GFR 30-59 ml/min (HCC) 09/13/2020   Post-menopausal 09/13/2020   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   SHOULDER PAIN, RIGHT 07/09/2010   KNEE PAIN 06/26/2010    PCP: Nani Gasser, MD  REFERRING PROVIDER: Rodney Langton, MD  REFERRING DIAG: M54.16 (ICD-10-CM) - Left  lumbar radiculitis   Rationale for Evaluation and Treatment: Rehabilitation  THERAPY DIAG:  Radiculopathy, lumbosacral region  Other low back pain  Pain in left leg  Muscle weakness (generalized)  Other abnormalities of gait and mobility  ONSET DATE: ~1 month ago  SUBJECTIVE:                                                                                                                                                                                           SUBJECTIVE STATEMENT: Pt reports she feels the most pain along her knee and straight down. Pt states she  got the nerve block and injection last Thursday -- feels that she is starting to feel the difference now. Pt reports she was able to do Spin class today.   PERTINENT HISTORY:  History of sciatica  PAIN:  Are you having pain? Yes: NPRS scale: currently 5-6/10 Pain location: pain from spine to LE Pain description: Lower leg pain, tightness in back of leg Aggravating factors: Prolonged sitting or standing Relieving factors: pain medication, ice  PRECAUTIONS: None  WEIGHT BEARING RESTRICTIONS: No  FALLS:  Has patient fallen in last 6 months? No  LIVING ENVIRONMENT: Lives with: lives with their spouse Lives in: House/apartment Stairs: Yes: Internal: 2 sets of steps; on right going up Has following equipment at home: None  OCCUPATION: HR consultant -- stand up desk, tries to walk  PLOF: Independent  PATIENT GOALS: Improve pain for mobility  NEXT MD VISIT:   OBJECTIVE:   DIAGNOSTIC FINDINGS:  Lumbar MRI 09/07/22 IMPRESSION: 1. Lumbar spine degeneration especially affecting facets with mild anterolisthesis at L3-4 and below. 2. L5-S1 12 mm synovial cyst at the left subarticular recess with left S1 impingement. 3. L4-5 moderate spinal stenosis. 4. No L4 nerve root compression noted.  PATIENT SURVEYS:  FOTO 54; predicted 55   SENSATION: N/T L toes 1-2  MUSCLE LENGTH: Hamstrings: Right 80 deg; Left  ~45 deg Thomas test: Did not assess  POSTURE: weight shift right, trunk slightly laterally flexed to left  PALPATION: Highly TTP in L glute med/max, piriformis, QL and lumbar paraspinals compared to R  LUMBAR ROM:   AROM eval  Flexion 60% painful on return to standing  Extension 20% pain  Right lateral flexion 100%  Left lateral flexion 100%  Right rotation 100%  Left rotation 100%   (Blank rows = not tested)  LOWER EXTREMITY ROM:     Active  Right eval Left eval  Hip flexion    Hip extension    Hip abduction    Hip adduction    Hip internal rotation    Hip external rotation    Knee flexion    Knee extension    Ankle dorsiflexion    Ankle plantarflexion    Ankle inversion    Ankle eversion     (Blank rows = not tested)  LOWER EXTREMITY MMT:    MMT Right eval Left eval Left 10/23/22 Left 11/06/22  Hip flexion 5 5    Hip extension 4+ 3- 3 3  Hip abduction 4+ 3- 3+ 3+  Hip adduction      Hip internal rotation 5 5    Hip external rotation 4+ 3+    Knee flexion 5 4    Knee extension 5 5    Ankle dorsiflexion      Ankle plantarflexion      Ankle inversion      Ankle eversion       (Blank rows = not tested)  LUMBAR SPECIAL TESTS:  Straight leg raise test: Positive, Slump test: Positive, and FABER test: Negative  FUNCTIONAL TESTS:  TBA  GAIT: Distance walked: 100' Assistive device utilized: None Level of assistance: Complete Independence Comments: Mildly antalgic with L lateral lean  [facility] Adult PT Treatment:                                                DATE: 11/19/22 Therapeutic Exercise: Recumbent bike L3 x  5 min Lateral hip shift x10 Figure 4 stretch x 30 sec Hip hinge sit<>stand x10 Quadruped bird dog x10, with yellow TB x10 Standing fire hydrant red TB 2x10 Prone hip ext with knee flexed 2x10 Supine hip flexor stretch x30 sec R&L Side plank 2x20 sec   [facility] Adult PT Treatment:                                                DATE:  11/06/22 Therapeutic Exercise: Treadmill 2.0 mph x 5 min Seated figure 4 stretch 2x30 sec Prone hip ext 2x10 Bird dog 2x10 Standing fire hydrant red TB 2x10 6" step glute iso press down 2x10 Manual Therapy: UPA/CPAs Sacrum L & R TPR & STM L glute max   [facility] Adult PT Treatment:                                                DATE: 10/30/22 Therapeutic Exercise: Prone press up on forearm x10 LTR x10 Supine piriformis stretch x 30 sec Supine figure 4 stretch 2 x30 sec Seated hip flexor stretch x30 sec Attempted bird dog but too much pain Supine PPT x10 Manual Therapy: Grade II to III hip mobilization for ER, ext and flexion Long arc distraction R hip Skilled assessment and palpation for TPDN Trigger Point Dry-Needling  Treatment instructions: Expect mild to moderate muscle soreness. S/S of pneumothorax if dry needled over a lung field, and to seek immediate medical attention should they occur. Patient verbalized understanding of these instructions and education.  Patient Consent Given: Yes Education handout provided: Previously provided Muscles treated: L peroneals, L piriformis (along origin near sacrum), L glute Electrical stimulation performed: No Parameters: N/A Treatment response/outcome: Twitch response, decreased muscle tension   [facility] Adult PT Treatment:                                                DATE: 10/23/22 Therapeutic Exercise: Treadmill 2.0 mph x 5 min Seated figure 4 stretch 2 x 30 sec Seated hamstring stretch x 30 sec Prone press up on elbows x10 Prone hip extension 2x10 Sidelying hip abduction 2x10 Side plank 3x10 sec forearm/knees Front plank 3x20 sec forearm/knees Quadruped bird dog 2x10 Self Care: Foam roll piriformis and glutes Sleep posture/position   [facility] Adult PT Treatment:                                                DATE: 10/15/22 Therapeutic Exercise: Treadmill 2.0 mph x 5 min Standing ITB stretch against wall x 30 sec Standing hamstring  stretch x 30 sec Standing hip flexor stretch x 30 sec Standing soleus stretch x 30 sec Piriformis stretch x 30 sec Tibial nerve floss x10 Manual Therapy: STM & TPR lateral L calf, glutes/piriformis near origin at sacrum Skilled assessment and palpation for TPDN Trigger Point Dry-Needling  Treatment instructions: Expect mild to moderate muscle soreness. S/S of pneumothorax if dry needled over a lung field, and to  seek immediate medical attention should they occur. Patient verbalized understanding of these instructions and education.  Patient Consent Given: Yes Education handout provided: Yes Muscles treated: L glute near sacrum, piriformis near sacrum, L peroneals Electrical stimulation performed: No Parameters: N/A Treatment response/outcome: Twitch response, decreased muscle tension Modalities: Moist heat pack x 10 min at end of session    PATIENT EDUCATION:  Education details: Exam findings, POC, initial HEP Person educated: Patient Education method: Explanation, Demonstration, and Handouts Education comprehension: verbalized understanding, returned demonstration, and needs further education  HOME EXERCISE PROGRAM: Access Code: [email or web address] URL: [email or web address] Date: 09/11/2022 Prepared by: Vernon Prey April Kirstie Peri  Exercises - Supine Hamstring Stretch with Strap  - 1 x daily - 7 x weekly - 2 sets - 30 sec hold - Supine ITB Stretch with Strap  - 1 x daily - 7 x weekly - 2 sets - 30 sec hold - Supine Double Knee to Chest  - 1 x daily - 7 x weekly - 2 sets - 30 sec hold - Supine Lower Trunk Rotation  - 1 x daily - 7 x weekly - 1 sets - 5 reps - 10 sec hold - Left Standing Lateral Shift Correction at Wall - Repetitions  - 1 x daily - 7 x weekly - 1 sets - 5 reps - 10 sec hold - Standing 'L' Stretch at Counter  - 1 x daily - 7 x weekly - 2 sets - 30 sec hold  ASSESSMENT:  CLINICAL IMPRESSION: Treatment focused on continuing core and hip strengthening. Pt  has been feeling better since injection. Remains very weak with glute max activation. Improving stability with bird dogs.    GOALS: Goals reviewed with patient? Yes  SHORT TERM GOALS: Target date: 10/09/2022   Pt will be ind with initial HEP Baseline: Goal status: MET  2.  Pt will be able to perform all lumbar ROM without pain Baseline:  Goal status: MET  LONG TERM GOALS: Target date: 12/18/2022   Pt will be ind with management and progression of HEP Baseline:  Goal status: MET  2.  Pt will be able to tolerate sitting and standing for at least 30 min for work and home tasks Baseline:  Goal status: MET  3.  Pt will report >/=50% improvement in pain Baseline:  11/06/22: ~50% improvement Goal status: IN PROGRESS  4.  Pt will have increased FOTO score to >/=68 Baseline:  Goal status: IN PROGRESS   PLAN:  PT FREQUENCY: 1x/week  PT DURATION: 6 weeks  PLANNED INTERVENTIONS: Therapeutic exercises, Therapeutic activity, Neuromuscular re-education, Balance training, Gait training, Patient/Family education, Self Care, Joint mobilization, Stair training, Aquatic Therapy, Dry Needling, Electrical stimulation, Spinal mobilization, Cryotherapy, Moist heat, Taping, Traction, Ionotophoresis 4mg /ml Dexamethasone, Manual therapy, and Re-evaluation.  PLAN FOR NEXT SESSION: Assess response to HEP. Continue glute/core strengthening. Manual work or TPDN as indicated.    Noni Stonesifer April Ma L Ledford Goodson, PT 11/19/2022, 2:58 PM

## 2022-11-28 ENCOUNTER — Encounter: Payer: PPO | Admitting: Physical Therapy

## 2022-12-03 ENCOUNTER — Other Ambulatory Visit: Payer: Self-pay | Admitting: Family Medicine

## 2022-12-04 ENCOUNTER — Other Ambulatory Visit: Payer: Self-pay | Admitting: *Deleted

## 2022-12-04 ENCOUNTER — Encounter: Payer: Self-pay | Admitting: Physical Therapy

## 2022-12-04 ENCOUNTER — Ambulatory Visit: Payer: PPO | Attending: Sports Medicine | Admitting: Physical Therapy

## 2022-12-04 ENCOUNTER — Telehealth: Payer: Self-pay | Admitting: Family Medicine

## 2022-12-04 DIAGNOSIS — R2689 Other abnormalities of gait and mobility: Secondary | ICD-10-CM | POA: Insufficient documentation

## 2022-12-04 DIAGNOSIS — M5417 Radiculopathy, lumbosacral region: Secondary | ICD-10-CM | POA: Diagnosis not present

## 2022-12-04 DIAGNOSIS — M6281 Muscle weakness (generalized): Secondary | ICD-10-CM | POA: Diagnosis present

## 2022-12-04 DIAGNOSIS — M5459 Other low back pain: Secondary | ICD-10-CM | POA: Diagnosis present

## 2022-12-04 DIAGNOSIS — M79605 Pain in left leg: Secondary | ICD-10-CM | POA: Insufficient documentation

## 2022-12-04 NOTE — Therapy (Signed)
OUTPATIENT PHYSICAL THERAPY THORACOLUMBAR TREATMENT   Patient Name: Kristine Bowman MRN: [id number] DOB:07/16/1955, 67 y.o., female Today's Date: 12/04/2022  END OF SESSION:  PT End of Session - 12/04/22 1023     Visit Number 9    Date for PT Re-Evaluation 12/18/22    Authorization Type [employer]    PT Start Time 1023    PT Stop Time 1100    PT Time Calculation (min) 37 min    Activity Tolerance Patient tolerated treatment well    Behavior During Therapy [facility] for tasks assessed/performed              Past Medical History:  Diagnosis Date   KNEE PAIN 06/26/2010   Qualifier: Diagnosis of  By: Linford Arnold MD, Catherine     Right calcaneal fracture    SHOULDER PAIN, RIGHT 07/09/2010   Qualifier: Diagnosis of  By: Linford Arnold MD, Santina Evans     Past Surgical History:  Procedure Laterality Date   COLONOSCOPY     ORIF CALCANEOUS FRACTURE Right 07/27/2020   Procedure: OPEN REDUCTION INTERNAL FIXATION (ORIF) CALCANEOUS FRACTURE;  Surgeon: Toni Arthurs, MD;  Location: [facility];  Service: Orthopedics;  Laterality: Right;    Patient Active Problem List   Diagnosis Date Noted   Primary osteoarthritis of left knee 11/04/2022   Left lumbar radiculitis 08/28/2022   DDD (degenerative disc disease), lumbar 08/28/2022   Left leg swelling 05/30/2021   Bacterial vaginosis 05/22/2021   AKI (acute kidney injury) (HCC) 04/16/2021   Bilateral lower abdominal pain 04/10/2021   Atypical chest pain 04/10/2021   Epigastric pain 04/10/2021   Dysuria 04/10/2021   Hematuria 04/06/2021   Vaginal irritation 04/06/2021   Osteopenia 11/09/2020   CKD (chronic kidney disease) stage 3, GFR 30-59 ml/min (HCC) 09/13/2020   Post-menopausal 09/13/2020   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   SHOULDER PAIN, RIGHT 07/09/2010   KNEE PAIN 06/26/2010    PCP: Nani Gasser, MD  REFERRING PROVIDER: Rodney Langton, MD  REFERRING DIAG: M54.16 (ICD-10-CM) - Left  lumbar radiculitis   Rationale for Evaluation and Treatment: Rehabilitation  THERAPY DIAG:  Radiculopathy, lumbosacral region  Other low back pain  Pain in left leg  Muscle weakness (generalized)  Other abnormalities of gait and mobility  ONSET DATE: ~1 month ago  SUBJECTIVE:                                                                                                                                                                                           SUBJECTIVE STATEMENT: Has been able to walk and do cycling. Pain can flare up intermittently. Pt doesn't  wake up with it as bad. Taking ibuprofen primarily. Will be seeing Dr. Karie Schwalbe to follow up on the injection. Has had days with no pain at all. Has been consistent with stretching.   PERTINENT HISTORY:  History of sciatica  PAIN:  Are you having pain? Yes: NPRS scale: 6-7/10 Pain location: pain from spine to LE Pain description: Lower leg pain, tightness in back of leg Aggravating factors: Prolonged sitting or standing Relieving factors: pain medication, ice  PRECAUTIONS: None  WEIGHT BEARING RESTRICTIONS: No  FALLS:  Has patient fallen in last 6 months? No  LIVING ENVIRONMENT: Lives with: lives with their spouse Lives in: House/apartment Stairs: Yes: Internal: 2 sets of steps; on right going up Has following equipment at home: None  OCCUPATION: HR consultant -- stand up desk, tries to walk  PLOF: Independent  PATIENT GOALS: Improve pain for mobility  NEXT MD VISIT:   OBJECTIVE:   DIAGNOSTIC FINDINGS:  Lumbar MRI 09/07/22 IMPRESSION: 1. Lumbar spine degeneration especially affecting facets with mild anterolisthesis at L3-4 and below. 2. L5-S1 12 mm synovial cyst at the left subarticular recess with left S1 impingement. 3. L4-5 moderate spinal stenosis. 4. No L4 nerve root compression noted.  PATIENT SURVEYS:  FOTO 54; predicted 64   SENSATION: N/T L toes 1-2  MUSCLE LENGTH: Hamstrings: Right 80  deg; Left ~45 deg Thomas test: Did not assess  POSTURE: weight shift right, trunk slightly laterally flexed to left  PALPATION: Highly TTP in L glute med/max, piriformis, QL and lumbar paraspinals compared to R  LUMBAR ROM:   AROM eval  Flexion 60% painful on return to standing  Extension 20% pain  Right lateral flexion 100%  Left lateral flexion 100%  Right rotation 100%  Left rotation 100%   (Blank rows = not tested)  LOWER EXTREMITY ROM:     Active  Right eval Left eval  Hip flexion    Hip extension    Hip abduction    Hip adduction    Hip internal rotation    Hip external rotation    Knee flexion    Knee extension    Ankle dorsiflexion    Ankle plantarflexion    Ankle inversion    Ankle eversion     (Blank rows = not tested)  LOWER EXTREMITY MMT:    MMT Right eval Left eval Left 10/23/22 Left 11/06/22  Hip flexion 5 5    Hip extension 4+ 3- 3 3  Hip abduction 4+ 3- 3+ 3+  Hip adduction      Hip internal rotation 5 5    Hip external rotation 4+ 3+    Knee flexion 5 4    Knee extension 5 5    Ankle dorsiflexion      Ankle plantarflexion      Ankle inversion      Ankle eversion       (Blank rows = not tested)  LUMBAR SPECIAL TESTS:  Straight leg raise test: Positive, Slump test: Positive, and FABER test: Negative  FUNCTIONAL TESTS:  TBA  GAIT: Distance walked: 100' Assistive device utilized: None Level of assistance: Complete Independence Comments: Mildly antalgic with L lateral lean  [facility] Adult PT Treatment:                                                DATE: 12/04/22 Therapeutic Exercise: Treadmill  2.2 mph x 5 min Seated hamstring stretch 2x60 sec Supine hip flexor stretch x 30 sec Supine piriformis stretch x 30 sec R&L Sidelying clamshell at differing degrees of hip flexion 2x5 each Prone hip ext with knee flexed 2x10  Quadruped bird dog red TB 2x10 Side plank 2x30 sec Squats 5# KB x10 Deadlifts 5# KB x10   [facility] Adult PT Treatment:                                                 DATE: 11/19/22 Therapeutic Exercise: Recumbent bike L3 x 5 min Lateral hip shift x10 Figure 4 stretch x 30 sec Hip hinge sit<>stand x10 Quadruped bird dog x10, with yellow TB x10 Standing fire hydrant red TB 2x10 Prone hip ext with knee flexed 2x10 Supine hip flexor stretch x30 sec R&L Side plank 2x20 sec   [facility] Adult PT Treatment:                                                DATE: 11/06/22 Therapeutic Exercise: Treadmill 2.0 mph x 5 min Seated figure 4 stretch 2x30 sec Prone hip ext 2x10 Bird dog 2x10 Standing fire hydrant red TB 2x10 6" step glute iso press down 2x10 Manual Therapy: UPA/CPAs Sacrum L & R TPR & STM L glute max   [facility] Adult PT Treatment:                                                DATE: 10/30/22 Therapeutic Exercise: Prone press up on forearm x10 LTR x10 Supine piriformis stretch x 30 sec Supine figure 4 stretch 2 x30 sec Seated hip flexor stretch x30 sec Attempted bird dog but too much pain Supine PPT x10 Manual Therapy: Grade II to III hip mobilization for ER, ext and flexion Long arc distraction R hip Skilled assessment and palpation for TPDN Trigger Point Dry-Needling  Treatment instructions: Expect mild to moderate muscle soreness. S/S of pneumothorax if dry needled over a lung field, and to seek immediate medical attention should they occur. Patient verbalized understanding of these instructions and education.  Patient Consent Given: Yes Education handout provided: Previously provided Muscles treated: L peroneals, L piriformis (along origin near sacrum), L glute Electrical stimulation performed: No Parameters: N/A Treatment response/outcome: Twitch response, decreased muscle tension     PATIENT EDUCATION:  Education details: Exam findings, POC, initial HEP Person educated: Patient Education method: Explanation, Demonstration, and Handouts Education comprehension: verbalized understanding,  returned demonstration, and needs further education  HOME EXERCISE PROGRAM: Access Code: [email or web address] URL: [email or web address] Date: 09/11/2022 Prepared by: Vernon Prey April Kirstie Peri  Exercises - Supine Hamstring Stretch with Strap  - 1 x daily - 7 x weekly - 2 sets - 30 sec hold - Supine ITB Stretch with Strap  - 1 x daily - 7 x weekly - 2 sets - 30 sec hold - Supine Double Knee to Chest  - 1 x daily - 7 x weekly - 2 sets - 30 sec hold - Supine Lower Trunk Rotation  - 1 x daily - 7 x weekly - 1 sets -  5 reps - 10 sec hold - Left Standing Lateral Shift Correction at Wall - Repetitions  - 1 x daily - 7 x weekly - 1 sets - 5 reps - 10 sec hold - Standing 'L' Stretch at Counter  - 1 x daily - 7 x weekly - 2 sets - 30 sec hold  ASSESSMENT:  CLINICAL IMPRESSION: Pt continues to demonstrate improving stability with bird dog -- now able to use red TB. Able to increase time with her side planks. Still remains the most unstable and weak with clamshells and prone hip extensions.    GOALS: Goals reviewed with patient? Yes  SHORT TERM GOALS: Target date: 10/09/2022   Pt will be ind with initial HEP Baseline: Goal status: MET  2.  Pt will be able to perform all lumbar ROM without pain Baseline:  Goal status: MET  LONG TERM GOALS: Target date: 12/18/2022   Pt will be ind with management and progression of HEP Baseline:  Goal status: MET  2.  Pt will be able to tolerate sitting and standing for at least 30 min for work and home tasks Baseline:  Goal status: MET  3.  Pt will report >/=50% improvement in pain Baseline:  11/06/22: ~50% improvement Goal status: IN PROGRESS  4.  Pt will have increased FOTO score to >/=68 Baseline:  Goal status: IN PROGRESS   PLAN:  PT FREQUENCY: 1x/week  PT DURATION: 6 weeks  PLANNED INTERVENTIONS: Therapeutic exercises, Therapeutic activity, Neuromuscular re-education, Balance training, Gait training, Patient/Family education,  Self Care, Joint mobilization, Stair training, Aquatic Therapy, Dry Needling, Electrical stimulation, Spinal mobilization, Cryotherapy, Moist heat, Taping, Traction, Ionotophoresis 4mg /ml Dexamethasone, Manual therapy, and Re-evaluation.  PLAN FOR NEXT SESSION: Assess response to HEP. Continue glute/core strengthening. Manual work or TPDN as indicated.     April Ma L , PT 12/04/2022, 10:23 AM

## 2022-12-04 NOTE — Telephone Encounter (Signed)
Medication sent.

## 2022-12-04 NOTE — Telephone Encounter (Signed)
Patient called in for a Refill for ibuprofen 800mg   [facility] [address]

## 2022-12-06 ENCOUNTER — Ambulatory Visit (INDEPENDENT_AMBULATORY_CARE_PROVIDER_SITE_OTHER): Payer: PPO | Admitting: Sports Medicine

## 2022-12-06 DIAGNOSIS — M1712 Unilateral primary osteoarthritis, left knee: Secondary | ICD-10-CM

## 2022-12-06 DIAGNOSIS — M5416 Radiculopathy, lumbar region: Secondary | ICD-10-CM

## 2022-12-06 MED ORDER — TRAMADOL HCL 50 MG PO TABS
50.0000 mg | ORAL_TABLET | Freq: Three times a day (TID) | ORAL | 0 refills | Status: DC | PRN
Start: 2022-12-06 — End: 2023-01-31

## 2022-12-06 NOTE — Assessment & Plan Note (Signed)
Much better after injection, MRI did show OA. Visco if insufficient relief.

## 2022-12-06 NOTE — Progress Notes (Signed)
    Procedures performed today:    None.  Independent interpretation of notes and tests performed by another provider:   None.  Brief History, Exam, Impression, and Recommendations:    Left lumbar radiculitis This is a very pleasant 67 year old female, she has multifactorial low back pain, initially presented with left lumbar radiculitis, L4 distribution, she had some physical therapy, did not tolerate gabapentin, failed therapy. She took some Lyrica, stopped. Restarted the Lyrica but is not happy with the weight gain. Ultimately we got an MRI that showed pathologic findings including S1 nerve root impingement, as well as a large left L5-S1 facet synovial cyst impinging into the spinal canal, she had its left selective S1 epidural, she also had a facet synovial cyst aspiration and injection, the cyst was ruptured and injected, she did notice complete resolution of the more midline axial back pain. She still does have axial back pain localized to the left SI joint. She continued to have some radicular symptoms, subsequently middle 3 toes consistent with more of an L5 distribution, we proceeded with an L5-S1 transforaminal epidural that really did not provide much relief, she is also not happy with the weight gain with Lyrica. Discontinue Lyrica, switching to tramadol, continue PT. She does need to lose significant weight, she will follow this up with her PCP, if she does not hit her weight loss target in 3 months she will be a candidate for GLP-1. We will see her back in 4 weeks if insufficient improvement we will proceed with left sacroiliac joint injection.   Primary osteoarthritis of left knee Much better after injection, MRI did show OA. Visco if insufficient relief.    ____________________________________________ [name]. Benjamin Stain, M.D., ABFM., CAQSM., AME. Primary Care and Sports Medicine [facility]  Adjunct Professor of Family Medicine   [employer]  Restaurant manager, fast food

## 2022-12-06 NOTE — Assessment & Plan Note (Signed)
This is a very pleasant 67 year old female, she has multifactorial low back pain, initially presented with left lumbar radiculitis, L4 distribution, she had some physical therapy, did not tolerate gabapentin, failed therapy. She took some Lyrica, stopped. Restarted the Lyrica but is not happy with the weight gain. Ultimately we got an MRI that showed pathologic findings including S1 nerve root impingement, as well as a large left L5-S1 facet synovial cyst impinging into the spinal canal, she had its left selective S1 epidural, she also had a facet synovial cyst aspiration and injection, the cyst was ruptured and injected, she did notice complete resolution of the more midline axial back pain. She still does have axial back pain localized to the left SI joint. She continued to have some radicular symptoms, subsequently middle 3 toes consistent with more of an L5 distribution, we proceeded with an L5-S1 transforaminal epidural that really did not provide much relief, she is also not happy with the weight gain with Lyrica. Discontinue Lyrica, switching to tramadol, continue PT. She does need to lose significant weight, she will follow this up with her PCP, if she does not hit her weight loss target in 3 months she will be a candidate for GLP-1. We will see her back in 4 weeks if insufficient improvement we will proceed with left sacroiliac joint injection.

## 2022-12-06 NOTE — Patient Instructions (Signed)
Look into fasciculations

## 2022-12-10 IMAGING — US US RENAL
1 series · 14 of 25 positions shown · non-contrast
Comparison: None.

CLINICAL DATA: Decreased GFR

EXAM:
RENAL / URINARY TRACT ULTRASOUND COMPLETE

[Series 1: us renal · 14 of 28 slices shown]
[im 1/28]
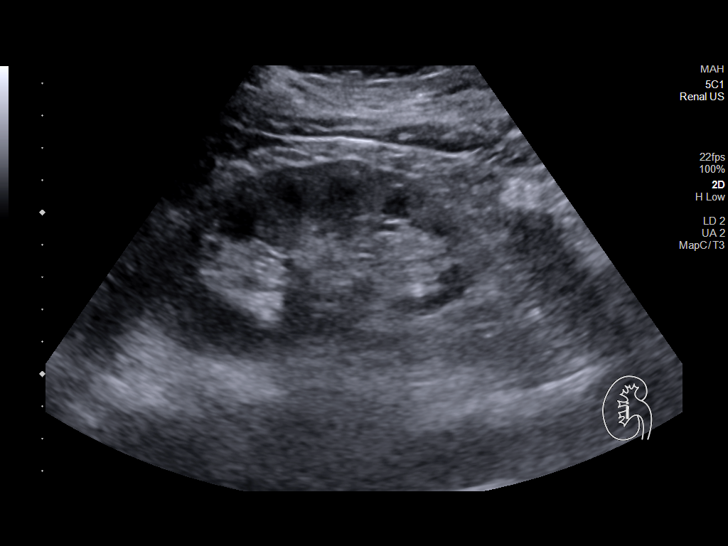
[im 3/28]
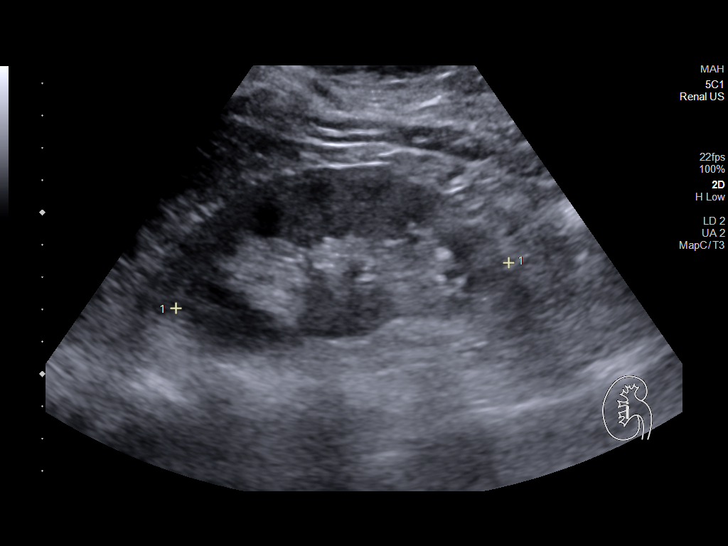
[im 5/28]
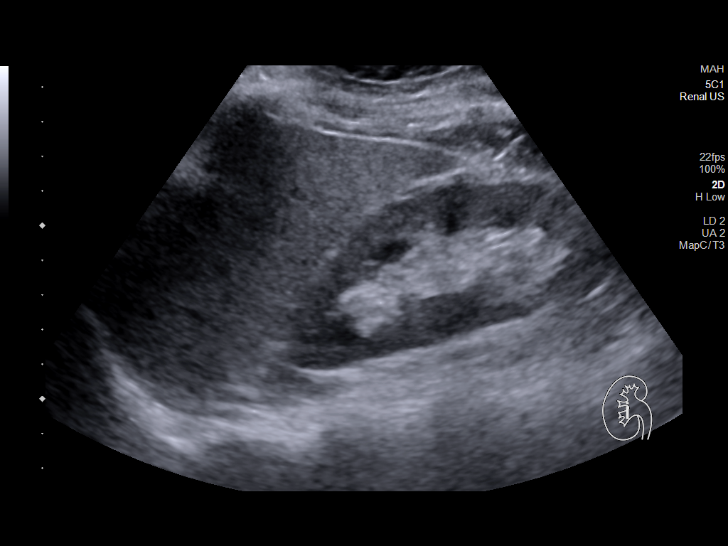
[im 7/28]
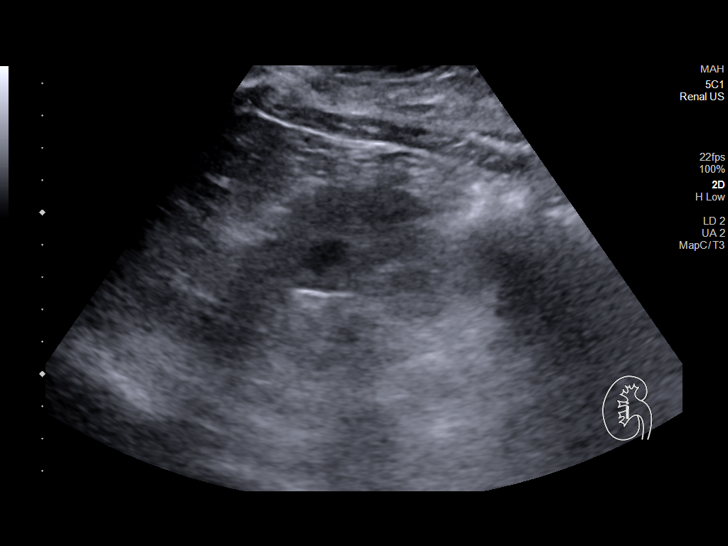
[im 10/28]
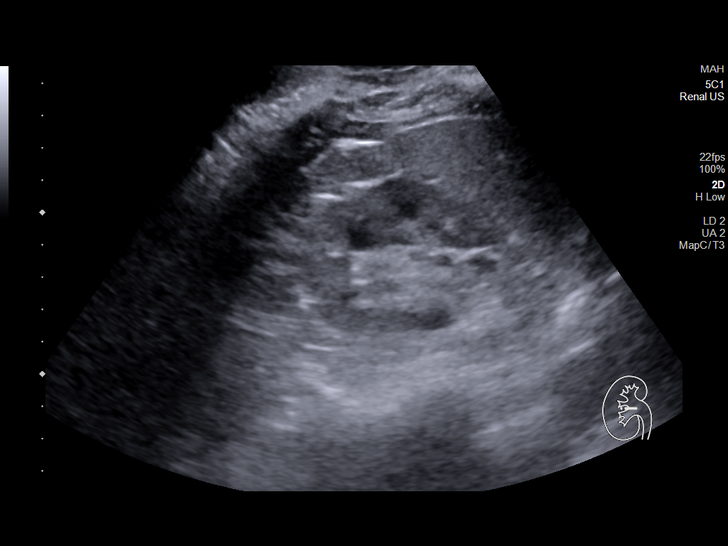
[im 11/28]
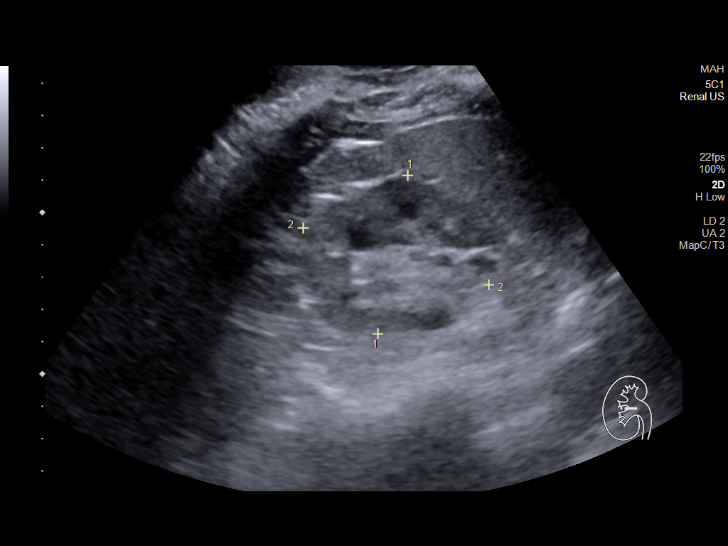
[im 13/28]
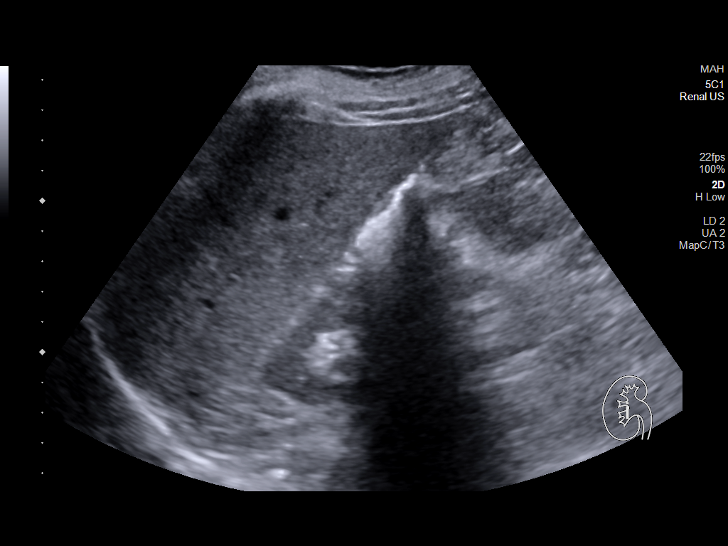
[im 15/28]
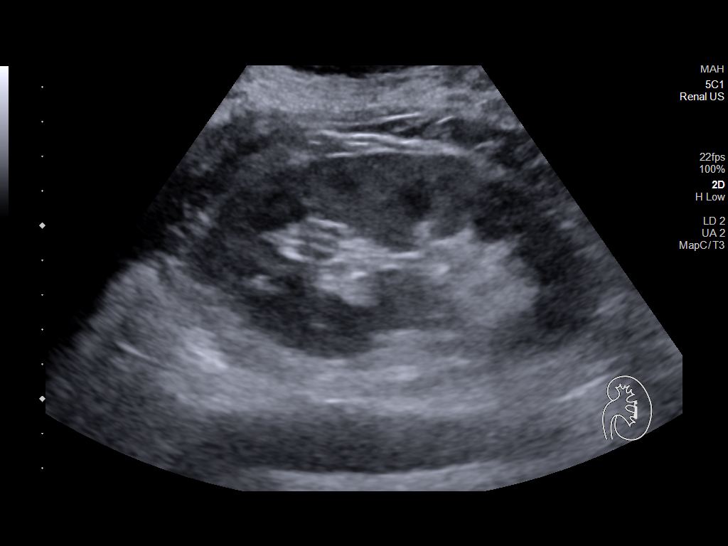
[im 17/28]
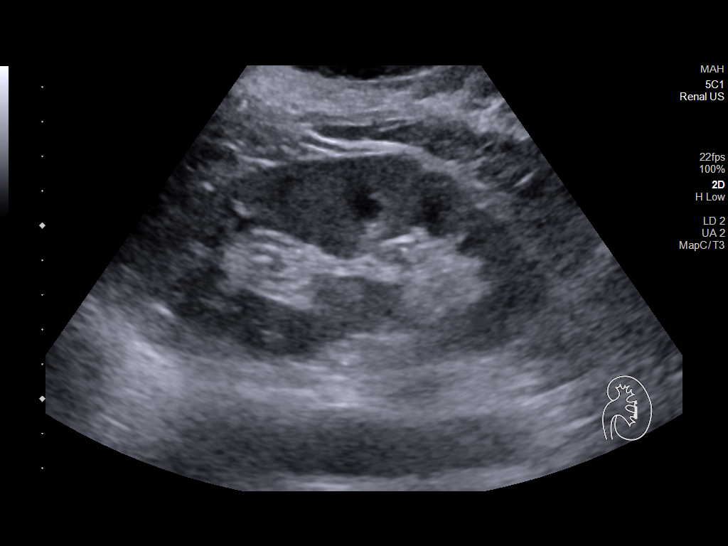
[im 19/28]
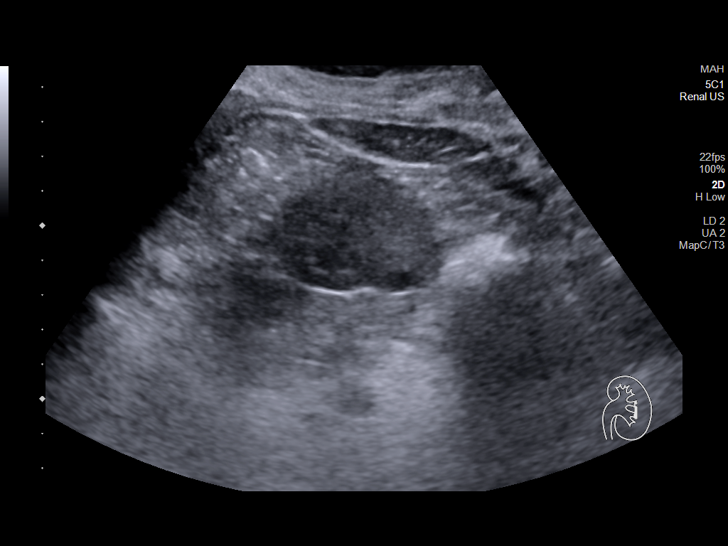
[im 21/28]
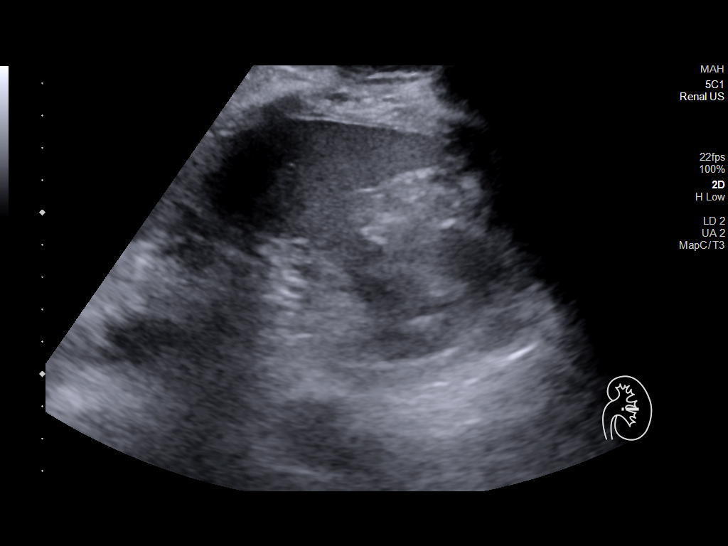
[im 23/28]
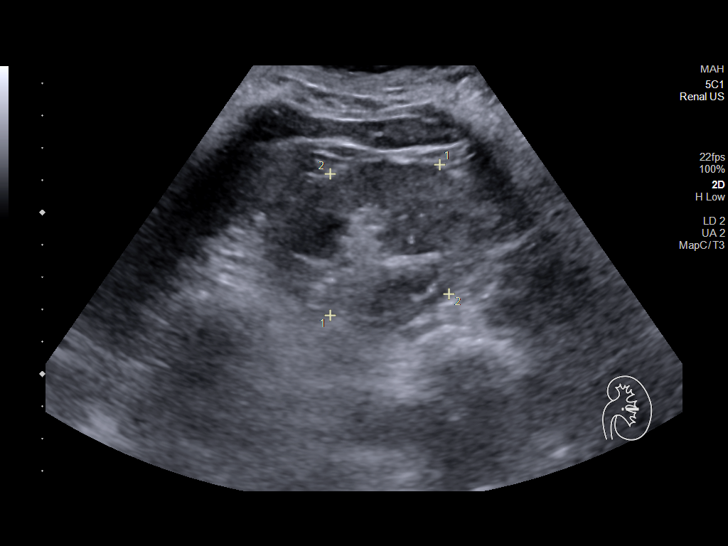
[im 25/28]
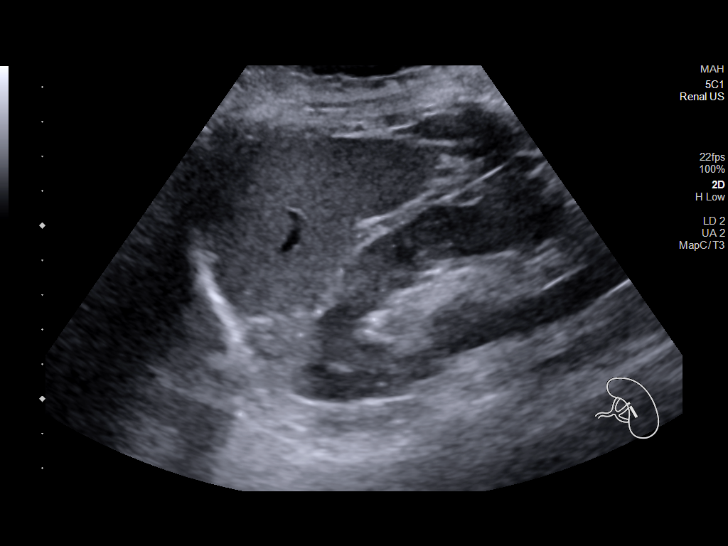
[im 28/28]
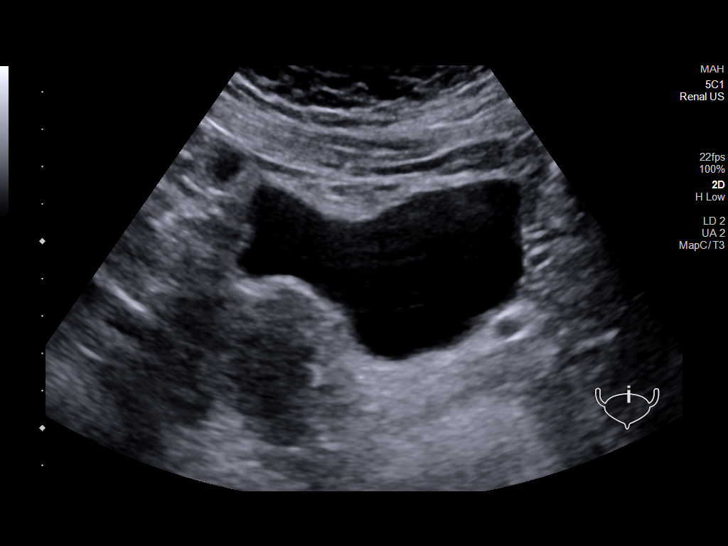

[14 of 25 positions shown; findings below may reference images not displayed]

FINDINGS: Right Kidney:

Renal measurements: 10.4 x 5.0 x 6.0 cm = volume: 163.3 mL.
Echogenicity within normal limits. No mass or hydronephrosis
visualized.

Left Kidney:

Renal measurements: 12.1 x 5.8 x 5.2 cm = volume: 191.7 mL.
Echogenicity within normal limits. No mass or hydronephrosis
visualized.

Bladder:

Appears normal for degree of bladder distention.

Other:

None.
IMPRESSION: 1. Unremarkable renal ultrasound.

## 2022-12-12 ENCOUNTER — Ambulatory Visit: Payer: PPO | Admitting: Physical Therapy

## 2022-12-12 ENCOUNTER — Encounter: Payer: Self-pay | Admitting: Physical Therapy

## 2022-12-12 DIAGNOSIS — R2689 Other abnormalities of gait and mobility: Secondary | ICD-10-CM

## 2022-12-12 DIAGNOSIS — M6281 Muscle weakness (generalized): Secondary | ICD-10-CM

## 2022-12-12 DIAGNOSIS — M79605 Pain in left leg: Secondary | ICD-10-CM

## 2022-12-12 DIAGNOSIS — M5417 Radiculopathy, lumbosacral region: Secondary | ICD-10-CM

## 2022-12-12 DIAGNOSIS — M5459 Other low back pain: Secondary | ICD-10-CM

## 2022-12-12 NOTE — Therapy (Signed)
OUTPATIENT PHYSICAL THERAPY THORACOLUMBAR TREATMENT   Patient Name: Kristine Bowman MRN: [id number] DOB:02-May-1955, 67 y.o., female Today's Date: 12/12/2022  END OF SESSION:  PT End of Session - 12/12/22 1502     Visit Number 10    Date for PT Re-Evaluation 12/18/22    Authorization Type [employer]    PT Start Time 1500    PT Stop Time 1530    PT Time Calculation (min) 30 min    Activity Tolerance Patient tolerated treatment well    Behavior During Therapy [facility] for tasks assessed/performed              Past Medical History:  Diagnosis Date   KNEE PAIN 06/26/2010   Qualifier: Diagnosis of  By: Linford Arnold MD, Catherine     Right calcaneal fracture    SHOULDER PAIN, RIGHT 07/09/2010   Qualifier: Diagnosis of  By: Linford Arnold MD, Santina Evans     Past Surgical History:  Procedure Laterality Date   COLONOSCOPY     ORIF CALCANEOUS FRACTURE Right 07/27/2020   Procedure: OPEN REDUCTION INTERNAL FIXATION (ORIF) CALCANEOUS FRACTURE;  Surgeon: Toni Arthurs, MD;  Location: [facility];  Service: Orthopedics;  Laterality: Right;    Patient Active Problem List   Diagnosis Date Noted   Primary osteoarthritis of left knee 11/04/2022   Left lumbar radiculitis 08/28/2022   DDD (degenerative disc disease), lumbar 08/28/2022   Left leg swelling 05/30/2021   Bacterial vaginosis 05/22/2021   AKI (acute kidney injury) (HCC) 04/16/2021   Bilateral lower abdominal pain 04/10/2021   Atypical chest pain 04/10/2021   Epigastric pain 04/10/2021   Dysuria 04/10/2021   Hematuria 04/06/2021   Vaginal irritation 04/06/2021   Osteopenia 11/09/2020   CKD (chronic kidney disease) stage 3, GFR 30-59 ml/min (HCC) 09/13/2020   Post-menopausal 09/13/2020   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   SHOULDER PAIN, RIGHT 07/09/2010   KNEE PAIN 06/26/2010    PCP: Nani Gasser, MD  REFERRING PROVIDER: Rodney Langton, MD  REFERRING DIAG: M54.16 (ICD-10-CM) -  Left lumbar radiculitis   Rationale for Evaluation and Treatment: Rehabilitation  THERAPY DIAG:  Radiculopathy, lumbosacral region  Other low back pain  Pain in left leg  Muscle weakness (generalized)  Other abnormalities of gait and mobility  ONSET DATE: ~1 month ago  SUBJECTIVE:                                                                                                                                                                                           SUBJECTIVE STATEMENT: Did meet with Dr. Karie Schwalbe last week. Pt wants to lose 25 lbs or more.  Pt states she has almost lost 10 lbs so far. Pt reports she did a lot Sunday and walked a lot which really put her in a lot of pain. Pt does feel recovered today. Will get radial pain down her calf still.   PERTINENT HISTORY:  History of sciatica  PAIN:  Are you having pain? Yes: NPRS scale: 6/10 Pain location: pain from spine to LE Pain description: Lower leg pain, tightness in back of leg Aggravating factors: Prolonged sitting or standing Relieving factors: pain medication, ice  PRECAUTIONS: None  WEIGHT BEARING RESTRICTIONS: No  FALLS:  Has patient fallen in last 6 months? No  LIVING ENVIRONMENT: Lives with: lives with their spouse Lives in: House/apartment Stairs: Yes: Internal: 2 sets of steps; on right going up Has following equipment at home: None  OCCUPATION: HR consultant -- stand up desk, tries to walk  PLOF: Independent  PATIENT GOALS: Improve pain for mobility  NEXT MD VISIT:   OBJECTIVE:   DIAGNOSTIC FINDINGS:  Lumbar MRI 09/07/22 IMPRESSION: 1. Lumbar spine degeneration especially affecting facets with mild anterolisthesis at L3-4 and below. 2. L5-S1 12 mm synovial cyst at the left subarticular recess with left S1 impingement. 3. L4-5 moderate spinal stenosis. 4. No L4 nerve root compression noted.  PATIENT SURVEYS:  FOTO 54; predicted 74   SENSATION: N/T L toes 1-2  MUSCLE  LENGTH: Hamstrings: Right 80 deg; Left ~45 deg Thomas test: Did not assess  POSTURE: weight shift right, trunk slightly laterally flexed to left  PALPATION: Highly TTP in L glute med/max, piriformis, QL and lumbar paraspinals compared to R  LUMBAR ROM:   AROM eval  Flexion 60% painful on return to standing  Extension 20% pain  Right lateral flexion 100%  Left lateral flexion 100%  Right rotation 100%  Left rotation 100%   (Blank rows = not tested)  LOWER EXTREMITY ROM:     Active  Right eval Left eval  Hip flexion    Hip extension    Hip abduction    Hip adduction    Hip internal rotation    Hip external rotation    Knee flexion    Knee extension    Ankle dorsiflexion    Ankle plantarflexion    Ankle inversion    Ankle eversion     (Blank rows = not tested)  LOWER EXTREMITY MMT:    MMT Right eval Left eval Left 10/23/22 Left 11/06/22  Hip flexion 5 5    Hip extension 4+ 3- 3 3  Hip abduction 4+ 3- 3+ 3+  Hip adduction      Hip internal rotation 5 5    Hip external rotation 4+ 3+    Knee flexion 5 4    Knee extension 5 5    Ankle dorsiflexion      Ankle plantarflexion      Ankle inversion      Ankle eversion       (Blank rows = not tested)  LUMBAR SPECIAL TESTS:  Straight leg raise test: Positive, Slump test: Positive, and FABER test: Negative  FUNCTIONAL TESTS:  TBA  GAIT: Distance walked: 100' Assistive device utilized: None Level of assistance: Complete Independence Comments: Mildly antalgic with L lateral lean  [facility] Adult PT Treatment:  DATE: 12/12/22 Therapeutic Exercise: Lateral L hip shift into wall x30 sec Prone press up x10 Prone quad stretch with strap x 30 sec Prone ext with knee flexed x10 Standing donkey kick iso into wall 10x5 sec Standing glute med iso into wall 10x5 sec Bird dog green TB 2x10    [facility] Adult PT Treatment:                                                DATE:  12/04/22 Therapeutic Exercise: Treadmill 2.2 mph x 5 min Seated hamstring stretch 2x60 sec Supine hip flexor stretch x 30 sec Supine piriformis stretch x 30 sec R&L Sidelying clamshell at differing degrees of hip flexion 2x5 each Prone hip ext with knee flexed 2x10  Quadruped bird dog red TB 2x10 Side plank 2x30 sec Squats 5# KB x10 Deadlifts 5# KB x10   [facility] Adult PT Treatment:                                                DATE: 11/19/22 Therapeutic Exercise: Recumbent bike L3 x 5 min Lateral hip shift x10 Figure 4 stretch x 30 sec Hip hinge sit<>stand x10 Quadruped bird dog x10, with yellow TB x10 Standing fire hydrant red TB 2x10 Prone hip ext with knee flexed 2x10 Supine hip flexor stretch x30 sec R&L Side plank 2x20 sec   [facility] Adult PT Treatment:                                                DATE: 11/06/22 Therapeutic Exercise: Treadmill 2.0 mph x 5 min Seated figure 4 stretch 2x30 sec Prone hip ext 2x10 Bird dog 2x10 Standing fire hydrant red TB 2x10 6" step glute iso press down 2x10 Manual Therapy: UPA/CPAs Sacrum L & R TPR & STM L glute max   [facility] Adult PT Treatment:                                                DATE: 10/30/22 Manual Therapy: Grade II to III hip mobilization for ER, ext and flexion Long arc distraction R hip Skilled assessment and palpation for TPDN Trigger Point Dry-Needling  Treatment instructions: Expect mild to moderate muscle soreness. S/S of pneumothorax if dry needled over a lung field, and to seek immediate medical attention should they occur. Patient verbalized understanding of these instructions and education.  Patient Consent Given: Yes Education handout provided: Previously provided Muscles treated: L peroneals, L piriformis (along origin near sacrum), L glute Electrical stimulation performed: No Parameters: N/A Treatment response/outcome: Twitch response, decreased muscle tension     PATIENT EDUCATION:  Education details:  Exam findings, POC, initial HEP Person educated: Patient Education method: Explanation, Demonstration, and Handouts Education comprehension: verbalized understanding, returned demonstration, and needs further education  HOME EXERCISE PROGRAM: Access Code: [email or web address] URL: [email or web address] Date: 09/11/2022 Prepared by: Vernon Prey April Kirstie Peri  Exercises - Supine Hamstring Stretch with Strap  - 1 x daily -  7 x weekly - 2 sets - 30 sec hold - Supine ITB Stretch with Strap  - 1 x daily - 7 x weekly - 2 sets - 30 sec hold - Supine Double Knee to Chest  - 1 x daily - 7 x weekly - 2 sets - 30 sec hold - Supine Lower Trunk Rotation  - 1 x daily - 7 x weekly - 1 sets - 5 reps - 10 sec hold - Left Standing Lateral Shift Correction at Wall - Repetitions  - 1 x daily - 7 x weekly - 1 sets - 5 reps - 10 sec hold - Standing 'L' Stretch at Counter  - 1 x daily - 7 x weekly - 2 sets - 30 sec hold  ASSESSMENT:  CLINICAL IMPRESSION: Pt continues to improve in her trunk stability during bird dog. Hip mobility and strength is also improving but still demos L>R glute med and max weakness. Mild R lateral hip shift today addressed with stretches and glute iso strengthening against wall. Ms Perdita is able to perform more activities at home but still gets intermittent radiating pain down her L lateral leg/calf.    GOALS: Goals reviewed with patient? Yes  SHORT TERM GOALS: Target date: 10/09/2022   Pt will be ind with initial HEP Baseline: Goal status: MET  2.  Pt will be able to perform all lumbar ROM without pain Baseline:  Goal status: MET  LONG TERM GOALS: Target date: 12/18/2022   Pt will be ind with management and progression of HEP Baseline:  Goal status: MET  2.  Pt will be able to tolerate sitting and standing for at least 30 min for work and home tasks Baseline:  Goal status: MET  3.  Pt will report >/=50% improvement in pain Baseline:  11/06/22: ~50%  improvement Goal status: IN PROGRESS  4.  Pt will have increased FOTO score to >/=68 Baseline:  Goal status: IN PROGRESS   PLAN:  PT FREQUENCY: 1x/week  PT DURATION: 6 weeks  PLANNED INTERVENTIONS: Therapeutic exercises, Therapeutic activity, Neuromuscular re-education, Balance training, Gait training, Patient/Family education, Self Care, Joint mobilization, Stair training, Aquatic Therapy, Dry Needling, Electrical stimulation, Spinal mobilization, Cryotherapy, Moist heat, Taping, Traction, Ionotophoresis 4mg /ml Dexamethasone, Manual therapy, and Re-evaluation.  PLAN FOR NEXT SESSION: Re-cert and check LTGs. Assess response to HEP. Continue glute/core strengthening. Manual work or TPDN as indicated.     April Ma L , PT 12/12/2022, 3:03 PM

## 2022-12-16 NOTE — Therapy (Addendum)
 OUTPATIENT PHYSICAL THERAPY THORACOLUMBAR TREATMENT, RECERTIFICATION AND DISCHARGE SUMMARY PROGRESS NOTE  Reporting Period 09/10/22 to 12/17/22   See note below for Objective Data and Assessment of Progress/Goals.   Patient Name: Kristine Bowman MRN: [id number] DOB:06/28/1955, 67 y.o., female Today's Date: 12/17/2022  END OF SESSION:  PT End of Session - 12/17/22 1403     Visit Number 11    Date for PT Re-Evaluation 12/18/22    Authorization Type [employer]    PT Start Time 1403    PT Stop Time 1439    PT Time Calculation (min) 36 min    Activity Tolerance Patient tolerated treatment well    Behavior During Therapy [facility] for tasks assessed/performed               Past Medical History:  Diagnosis Date   KNEE PAIN 06/26/2010   Qualifier: Diagnosis of  By: Greer Leak MD, Catherine     Right calcaneal fracture    SHOULDER PAIN, RIGHT 07/09/2010   Qualifier: Diagnosis of  By: Greer Leak MD, Bynum Cassis     Past Surgical History:  Procedure Laterality Date   COLONOSCOPY     ORIF CALCANEOUS FRACTURE Right 07/27/2020   Procedure: OPEN REDUCTION INTERNAL FIXATION (ORIF) CALCANEOUS FRACTURE;  Surgeon: Amada Backer, MD;  Location: [facility];  Service: Orthopedics;  Laterality: Right;    Patient Active Problem List   Diagnosis Date Noted   Primary osteoarthritis of left knee 11/04/2022   Left lumbar radiculitis 08/28/2022   DDD (degenerative disc disease), lumbar 08/28/2022   Left leg swelling 05/30/2021   Bacterial vaginosis 05/22/2021   AKI (acute kidney injury) (HCC) 04/16/2021   Bilateral lower abdominal pain 04/10/2021   Atypical chest pain 04/10/2021   Epigastric pain 04/10/2021   Dysuria 04/10/2021   Hematuria 04/06/2021   Vaginal irritation 04/06/2021   Osteopenia 11/09/2020   CKD (chronic kidney disease) stage 3, GFR 30-59 ml/min (HCC) 09/13/2020   Post-menopausal 09/13/2020   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   SHOULDER PAIN,  RIGHT 07/09/2010   KNEE PAIN 06/26/2010    PCP: Duaine German, MD  REFERRING PROVIDER: Annemarie Kil, MD  REFERRING DIAG: M54.16 (ICD-10-CM) - Left lumbar radiculitis   Rationale for Evaluation and Treatment: Rehabilitation  THERAPY DIAG:  Radiculopathy, lumbosacral region  Other low back pain  Pain in left leg  Muscle weakness (generalized)  Other abnormalities of gait and mobility  ONSET DATE: ~1 month ago  SUBJECTIVE:  SUBJECTIVE STATEMENT: Had two nerve blocks and may try another in the next 2 months. Maybe in [facility].   PERTINENT HISTORY:  History of sciatica  PAIN:  Are you having pain? Yes: NPRS scale: 5/10 Pain location: pain from spine to LE Pain description: Lower leg pain, tightness in back of leg Aggravating factors: Prolonged sitting or standing Relieving factors: pain medication, ice  PRECAUTIONS: None  WEIGHT BEARING RESTRICTIONS: No  FALLS:  Has patient fallen in last 6 months? No  LIVING ENVIRONMENT: Lives with: lives with their spouse Lives in: House/apartment Stairs: Yes: Internal: 2 sets of steps; on right going up Has following equipment at home: None  OCCUPATION: HR consultant -- stand up desk, tries to walk  PLOF: Independent  PATIENT GOALS: Improve pain for mobility  NEXT MD VISIT:   OBJECTIVE:   DIAGNOSTIC FINDINGS:  Lumbar MRI 09/07/22 IMPRESSION: 1. Lumbar spine degeneration especially affecting facets with mild anterolisthesis at L3-4 and below. 2. L5-S1 12 mm synovial cyst at the left subarticular recess with left S1 impingement. 3. L4-5 moderate spinal stenosis. 4. No L4 nerve root compression noted.  PATIENT SURVEYS:  FOTO 54; predicted 43   SENSATION: N/T L toes 1-2  MUSCLE LENGTH: Hamstrings: Right 80 deg; Left ~45  deg Thomas test: Did not assess  POSTURE: weight shift right, trunk slightly laterally flexed to left  PALPATION: Highly TTP in L glute med/max, piriformis, QL and lumbar paraspinals compared to R  LUMBAR ROM:   AROM eval  Flexion 60% painful on return to standing  Extension 20% pain  Right lateral flexion 100%  Left lateral flexion 100%  Right rotation 100%  Left rotation 100%   (Blank rows = not tested)  LOWER EXTREMITY ROM:     Active  Right eval Left eval  Hip flexion    Hip extension    Hip abduction    Hip adduction    Hip internal rotation    Hip external rotation    Knee flexion    Knee extension    Ankle dorsiflexion    Ankle plantarflexion    Ankle inversion    Ankle eversion     (Blank rows = not tested)  LOWER EXTREMITY MMT:    MMT Right eval Left eval Left 10/23/22 Left 11/06/22  Hip flexion 5 5    Hip extension 4+ 3- 3 3  Hip abduction 4+ 3- 3+ 3+  Hip adduction      Hip internal rotation 5 5    Hip external rotation 4+ 3+    Knee flexion 5 4    Knee extension 5 5    Ankle dorsiflexion      Ankle plantarflexion      Ankle inversion      Ankle eversion       (Blank rows = not tested)  LUMBAR SPECIAL TESTS:  Straight leg raise test: Positive, Slump test: Positive, and FABER test: Negative  FUNCTIONAL TESTS:  TBA  GAIT: Distance walked: 100' Assistive device utilized: None Level of assistance: Complete Independence Comments: Mildly antalgic with L lateral lean  [facility] Adult PT Treatment:                                                DATE: 12/17/22 Therapeutic Exercise: Lateral L hip shift into wall x30 sec, then 5 sec hold x 5 Standing warrior  with left leg back and R SB with heel raise x 10 for nerve glide FOTO completed; discussed POC Manual Therapy: to decrease muscle spasm and pain and improve mobility; Skilled palpation and monitoring of soft tissues during DN; STM to L gastroc, peroneals, solues Trigger Point Dry-Needling   Treatment instructions: Expect mild to moderate muscle soreness. S/S of pneumothorax if dry needled over a lung field, and to seek immediate medical attention should they occur. Patient verbalized understanding of these instructions and education. Patient Consent Given: Yes Education handout provided: Previously provided Muscles treated: Left lateral gastroc, soleus and peroneals Electrical stimulation performed: No Parameters: N/A Treatment response/outcome: Twitch Response Elicited and Palpable Increase in Muscle Length   [facility] Adult PT Treatment:                                                DATE: 12/12/22 Therapeutic Exercise: Lateral L hip shift into wall x30 sec Prone press up x10 Prone quad stretch with strap x 30 sec Prone ext with knee flexed x10 Standing donkey kick iso into wall 10x5 sec Standing glute med iso into wall 10x5 sec Bird dog green TB 2x10    [facility] Adult PT Treatment:                                                DATE: 12/04/22 Therapeutic Exercise: Treadmill 2.2 mph x 5 min Seated hamstring stretch 2x60 sec Supine hip flexor stretch x 30 sec Supine piriformis stretch x 30 sec R&L Sidelying clamshell at differing degrees of hip flexion 2x5 each Prone hip ext with knee flexed 2x10  Quadruped bird dog red TB 2x10 Side plank 2x30 sec Squats 5# KB x10 Deadlifts 5# KB x10   [facility] Adult PT Treatment:                                                DATE: 11/19/22 Therapeutic Exercise: Recumbent bike L3 x 5 min Lateral hip shift x10 Figure 4 stretch x 30 sec Hip hinge sit<>stand x10 Quadruped bird dog x10, with yellow TB x10 Standing fire hydrant red TB 2x10 Prone hip ext with knee flexed 2x10 Supine hip flexor stretch x30 sec R&L Side plank 2x20 sec   PATIENT EDUCATION:  Education details: Exam findings, POC, initial HEP Person educated: Patient Education method: Explanation, Demonstration, and Handouts Education comprehension: verbalized understanding,  returned demonstration, and needs further education  HOME EXERCISE PROGRAM: Access Code: [email or web address] URL: [email or web address] Date: 12/17/2022 Prepared by: Concha Deed  Exercises  - Active Hip Flexor Stretch  - 2 x daily - 7 x weekly - 1 sets - 10 reps  ASSESSMENT:  CLINICAL IMPRESSION: Bich has met all of her LTGs except her pain goal and FOTO goal. She is pleased with her current functional level and would like to keep working on exercises at home to manage her pain. She continues to report 5/10 pain in the L lateral lower leg primarily. She responded very well to TPDN here today. She had decreased pain immediately after manual therapy upon standing. Plan to place [place] on hold for 30  days and if she does not return to PT, to discharge.     GOALS: Goals reviewed with patient? Yes  SHORT TERM GOALS: Target date: 10/09/2022   Pt will be ind with initial HEP Baseline: Goal status: MET  2.  Pt will be able to perform all lumbar ROM without pain Baseline:  Goal status: MET  LONG TERM GOALS: Target date: 12/18/2022   Pt will be ind with management and progression of HEP Baseline:  Goal status: MET  2.  Pt will be able to tolerate sitting and standing for at least 30 min for work and home tasks Baseline:  Goal status: MET  3.  Pt will report >/=50% improvement in pain Baseline:  11/06/22: ~50% improvement Goal status: IN PROGRESS 12/17/22  4.  Pt will have increased FOTO score to >/=68 Baseline:  Goal status: IN PROGRESS 12/17/22 63   PLAN:  PT FREQUENCY: 1x/week  PT DURATION: 6 weeks  PLANNED INTERVENTIONS: Therapeutic exercises, Therapeutic activity, Neuromuscular re-education, Balance training, Gait training, Patient/Family education, Self Care, Joint mobilization, Stair training, Aquatic Therapy, Dry Needling, Electrical stimulation, Spinal mobilization, Cryotherapy, Moist heat, Taping, Traction, Ionotophoresis 4mg /ml Dexamethasone, Manual therapy, and  Re-evaluation.  PLAN FOR NEXT SESSION: See d/c summary    Jinx Mourning, PT  12/17/2022, 4:41 PM   PHYSICAL THERAPY DISCHARGE SUMMARY  Visits from Start of Care: 11  Current functional level related to goals / functional outcomes: See clinical impression above   Remaining deficits: See clinical impression above   Education / Equipment: HEP   Patient agrees to discharge. Patient goals were partially met. Patient is being discharged due to being pleased with the current functional level.   Jinx Mourning, PT 08/14/23 2:09 PM  [facility] at [facility] [address] [place], [place] [address]  [phone] (office) [phone] (fax)

## 2022-12-17 ENCOUNTER — Encounter: Payer: Self-pay | Admitting: Physical Therapy

## 2022-12-17 ENCOUNTER — Ambulatory Visit: Payer: PPO | Admitting: Physical Therapy

## 2022-12-17 DIAGNOSIS — M5417 Radiculopathy, lumbosacral region: Secondary | ICD-10-CM | POA: Diagnosis not present

## 2022-12-17 DIAGNOSIS — M6281 Muscle weakness (generalized): Secondary | ICD-10-CM

## 2022-12-17 DIAGNOSIS — M5459 Other low back pain: Secondary | ICD-10-CM

## 2022-12-17 DIAGNOSIS — M79605 Pain in left leg: Secondary | ICD-10-CM

## 2022-12-17 DIAGNOSIS — R2689 Other abnormalities of gait and mobility: Secondary | ICD-10-CM

## 2022-12-25 ENCOUNTER — Encounter: Payer: Self-pay | Admitting: Family Medicine

## 2022-12-31 ENCOUNTER — Encounter: Payer: Self-pay | Admitting: Sports Medicine

## 2023-01-10 ENCOUNTER — Other Ambulatory Visit (INDEPENDENT_AMBULATORY_CARE_PROVIDER_SITE_OTHER): Payer: PPO

## 2023-01-10 ENCOUNTER — Telehealth: Payer: Self-pay | Admitting: Sports Medicine

## 2023-01-10 ENCOUNTER — Ambulatory Visit (INDEPENDENT_AMBULATORY_CARE_PROVIDER_SITE_OTHER): Payer: PPO | Admitting: Sports Medicine

## 2023-01-10 DIAGNOSIS — M1712 Unilateral primary osteoarthritis, left knee: Secondary | ICD-10-CM

## 2023-01-10 MED ORDER — TRIAMCINOLONE ACETONIDE 40 MG/ML IJ SUSP
40.0000 mg | Freq: Once | INTRAMUSCULAR | Status: AC
Start: 2023-01-10 — End: 2023-01-10
  Administered 2023-01-10: 40 mg via INTRAMUSCULAR

## 2023-01-10 NOTE — Progress Notes (Signed)
    Procedures performed today:    Procedure: Real-time Ultrasound Guided aspiration/injection of the left knee Device: Samsung HS60  Verbal informed consent obtained.  Time-out conducted.  Noted no overlying erythema, induration, or other signs of local infection.  Skin prepped in a sterile fashion.  Local anesthesia: Topical Ethyl chloride.  With sterile technique and under real time ultrasound guidance: Moderate effusion noted, aspirated 20 mL of clear, straw-colored fluid, syringe switched and 1 cc Kenalog 40, 2 cc lidocaine, 2 cc bupivacaine injected easily Completed without difficulty  Advised to call if fevers/chills, erythema, induration, drainage, or persistent bleeding.  Images permanently stored and available for review in PACS.  Impression: Technically successful ultrasound guided aspiration/injection.  Independent interpretation of notes and tests performed by another provider:   None.  Brief History, Exam, Impression, and Recommendations:    Primary osteoarthritis of left knee Injection back in June provided good relief, she is now having recurrence of pain. MRI showed osteoarthritis. Aspiration and left knee steroid injection, also proceeding with [employer] approval.    ____________________________________________ [name]. Benjamin Stain, M.D., ABFM., CAQSM., AME. Primary Care and Sports Medicine [facility]  Adjunct Professor of Family Medicine  [employer]  Restaurant manager, fast food

## 2023-01-10 NOTE — Addendum Note (Signed)
Addended by: Carren Rang A on: 01/10/2023 02:18 PM   Modules accepted: Orders

## 2023-01-10 NOTE — Assessment & Plan Note (Addendum)
Injection back in June provided good relief, she is now having recurrence of pain. MRI showed osteoarthritis. Aspiration and left knee steroid injection, also proceeding with [employer] approval.

## 2023-01-10 NOTE — Telephone Encounter (Signed)
[employer] approval please, x-ray confirmed osteoarthritis, failed steroid injections, 6 weeks of physical therapy, left knee only

## 2023-01-15 ENCOUNTER — Telehealth: Payer: Self-pay | Admitting: Sports Medicine

## 2023-01-15 NOTE — Telephone Encounter (Signed)
[employer] approval please, left knee x-ray confirmed osteoarthritis, failed steroid injections, analgesics, greater than 6 weeks of physical therapy

## 2023-01-20 NOTE — Telephone Encounter (Signed)
PA information submitted via [email or web address] for Orthovisc Paperwork has been printed and given to Dr. T for signatures. Once obtained, information will be faxed to [employer] at [phone]  

## 2023-01-21 ENCOUNTER — Ambulatory Visit: Payer: PPO | Admitting: Sports Medicine

## 2023-01-29 NOTE — Telephone Encounter (Signed)
Benefits Investigation Details received from [employer] Injection: Orthovisc May fill through: Buy and Bill OR Specialty Pharmacy OV Copay/Coinsurance: 0% Product Copay: 20% Administration Coinsurance: 0% Administration Copay: $20 Out of Pocket Max: $3200 (met: $428.57)

## 2023-01-31 ENCOUNTER — Telehealth: Payer: Self-pay | Admitting: Family Medicine

## 2023-01-31 MED ORDER — IBUPROFEN 800 MG PO TABS: 800.0000 mg | ORAL_TABLET | Freq: Three times a day (TID) | ORAL | 0 refills | Status: DC | PRN

## 2023-01-31 NOTE — Telephone Encounter (Signed)
Prescription Request  01/31/2023  LOV: 06/20/2022  What is the name of the medication or equipment? ibuprofen (ADVIL) 800 MG tablet   Have you contacted your pharmacy to request a refill? Yes   Which pharmacy would you like this sent to?  [facility] [phone] - [place], [place] - [address] [place] [address] Phone: [phone] Fax: [phone]    Patient notified that their request is being sent to the clinical staff for review and that they should receive a response within 2 business days.   Please advise at [facility] [phone]

## 2023-01-31 NOTE — Telephone Encounter (Signed)
Medication has been sent to pts pharmacy. 

## 2023-02-05 NOTE — Telephone Encounter (Signed)
Left Detailed VM for patient that she is approved and copay is only 160 per week for 4 weeks and told her to call back and get scheduled.

## 2023-02-17 ENCOUNTER — Encounter: Payer: Self-pay | Admitting: Sports Medicine

## 2023-02-17 ENCOUNTER — Ambulatory Visit (INDEPENDENT_AMBULATORY_CARE_PROVIDER_SITE_OTHER): Payer: PPO | Admitting: Sports Medicine

## 2023-02-17 ENCOUNTER — Other Ambulatory Visit (INDEPENDENT_AMBULATORY_CARE_PROVIDER_SITE_OTHER): Payer: PPO

## 2023-02-17 DIAGNOSIS — M1712 Unilateral primary osteoarthritis, left knee: Secondary | ICD-10-CM

## 2023-02-17 MED ORDER — HYALURONAN 30 MG/2ML IX SOSY
30.0000 mg | PREFILLED_SYRINGE | Freq: Once | INTRA_ARTICULAR | Status: AC
Start: 2023-02-17 — End: 2023-02-17
  Administered 2023-02-17: 30 mg via INTRA_ARTICULAR

## 2023-02-17 NOTE — Addendum Note (Signed)
Addended by: Carren Rang A on: 02/17/2023 01:37 PM   Modules accepted: Orders

## 2023-02-17 NOTE — Assessment & Plan Note (Signed)
Orthovisc No. 1 of 4 left knee, return in 1 week for #2 of 4.

## 2023-02-17 NOTE — Progress Notes (Signed)
    Procedures performed today:    Procedure: Real-time Ultrasound Guided injection of the left knee Device: Samsung HS60  Verbal informed consent obtained.  Time-out conducted.  Noted no overlying erythema, induration, or other signs of local infection.  Skin prepped in a sterile fashion.  Local anesthesia: Topical Ethyl chloride.  With sterile technique and under real time ultrasound guidance: Trace effusion noted, 30 mg/2 mL of OrthoVisc (sodium hyaluronate) in a prefilled syringe was injected easily into the knee through a 22-gauge needle. Completed without difficulty  Advised to call if fevers/chills, erythema, induration, drainage, or persistent bleeding.  Images permanently stored and available for review in PACS.  Impression: Technically successful ultrasound guided injection.  Independent interpretation of notes and tests performed by another provider:   None.  Brief History, Exam, Impression, and Recommendations:    Primary osteoarthritis of left knee Orthovisc No. 1 of 4 left knee, return in 1 week for #2 of 4.    ____________________________________________ [name]. Benjamin Stain, M.D., ABFM., CAQSM., AME. Primary Care and Sports Medicine [facility]  Adjunct Professor of Family Medicine  [place] [employer] [facility] [employer]  Restaurant manager, fast food

## 2023-02-24 ENCOUNTER — Ambulatory Visit (INDEPENDENT_AMBULATORY_CARE_PROVIDER_SITE_OTHER): Payer: PPO | Admitting: Sports Medicine

## 2023-02-24 ENCOUNTER — Other Ambulatory Visit (INDEPENDENT_AMBULATORY_CARE_PROVIDER_SITE_OTHER): Payer: PPO

## 2023-02-24 DIAGNOSIS — M1712 Unilateral primary osteoarthritis, left knee: Secondary | ICD-10-CM

## 2023-02-24 DIAGNOSIS — M5416 Radiculopathy, lumbar region: Secondary | ICD-10-CM

## 2023-02-24 MED ORDER — HYALURONAN 30 MG/2ML IX SOSY
30.0000 mg | PREFILLED_SYRINGE | Freq: Once | INTRA_ARTICULAR | Status: AC
Start: 2023-02-24 — End: 2023-02-24
  Administered 2023-02-24: 30 mg via INTRA_ARTICULAR

## 2023-02-24 NOTE — Progress Notes (Signed)
    Procedures performed today:    Procedure: Real-time Ultrasound Guided aspiration/injection of the left knee Device: Samsung HS60  Verbal informed consent obtained.  Time-out conducted.  Noted no overlying erythema, induration, or other signs of local infection.  Skin prepped in a sterile fashion.  Local anesthesia: Topical Ethyl chloride.  With sterile technique and under real time ultrasound guidance: Effusion noted, I aspirated 20 mL of clear, straw-colored fluid, syringe switched and 30 mg/2 mL of OrthoVisc (sodium hyaluronate) in a prefilled syringe was injected easily into the knee through an 18-gauge needle. Completed without difficulty  Advised to call if fevers/chills, erythema, induration, drainage, or persistent bleeding.  Images permanently stored and available for review in PACS.  Impression: Technically successful ultrasound guided aspiration/injection.  Independent interpretation of notes and tests performed by another provider:   None.  Brief History, Exam, Impression, and Recommendations:    Primary osteoarthritis of left knee Aspiration and Orthovisc 2 of 4 left knee, return in 1 week for #3 of 4.  Left lumbar radiculitis This is a very pleasant 67 year old female, she has multifactorial low back pain, this is presenting as more of a left lumbar radiculitis. She did not tolerate gabapentin, Lyrica, she did not respond to physical therapy. She had an MRI that showed a large left L5-S1 facet cyst impinging to the spinal canal as well as some neuroforaminal stenosis at multiple levels. Radicular symptoms were consistent with more of an L5 radiculopathy with radiation to the middle 3 toes. She did well initially with a facet cyst aspiration and injection as well as an epidural, her most recent epidural was in July, left L5-S1 transforaminal with Dr. Mosetta Putt, she is requesting this again.    ____________________________________________ [name]. Benjamin Stain, M.D.,  ABFM., CAQSM., AME. Primary Care and Sports Medicine [facility]  Adjunct Professor of Family Medicine  [place] [employer] [facility] [employer]  Restaurant manager, fast food

## 2023-02-24 NOTE — Assessment & Plan Note (Addendum)
Aspiration and Orthovisc 2 of 4 left knee, return in 1 week for #3 of 4.

## 2023-02-24 NOTE — Addendum Note (Signed)
Addended by: Carren Rang A on: 02/24/2023 09:57 AM   Modules accepted: Orders

## 2023-02-24 NOTE — Assessment & Plan Note (Signed)
This is a very pleasant 67 year old female, she has multifactorial low back pain, this is presenting as more of a left lumbar radiculitis. She did not tolerate gabapentin, Lyrica, she did not respond to physical therapy. She had an MRI that showed a large left L5-S1 facet cyst impinging to the spinal canal as well as some neuroforaminal stenosis at multiple levels. Radicular symptoms were consistent with more of an L5 radiculopathy with radiation to the middle 3 toes. She did well initially with a facet cyst aspiration and injection as well as an epidural, her most recent epidural was in July, left L5-S1 transforaminal with Dr. Mosetta Putt, she is requesting this again.

## 2023-02-25 ENCOUNTER — Telehealth: Payer: PPO | Admitting: Physician Assistant

## 2023-02-25 ENCOUNTER — Encounter: Payer: Self-pay | Admitting: Physician Assistant

## 2023-02-25 VITALS — Temp 98.2°F

## 2023-02-25 DIAGNOSIS — R197 Diarrhea, unspecified: Secondary | ICD-10-CM | POA: Diagnosis not present

## 2023-02-25 MED ORDER — DIPHENOXYLATE-ATROPINE 2.5-0.025 MG PO TABS: ORAL_TABLET | ORAL | 0 refills | Status: DC

## 2023-02-25 NOTE — Progress Notes (Unsigned)
..  Virtual Visit via Video Note  I connected with Kristine Bowman on 02/25/23 at  1:40 PM EDT by a video enabled telemedicine application and verified that I am speaking with the correct person using two identifiers.  Location: Patient: car Provider: clinic  .Marland KitchenParticipating in visit:  Patient: Kristine Bowman Provider: Tandy Gaw PA-C   I discussed the limitations of evaluation and management by telemedicine and the availability of in person appointments. The patient expressed understanding and agreed to proceed.  History of Present Illness: Pt is a 67 yo female with diarrhea for 3 days. She denies any fever, chills, body aches, abdominal pain. No other sick contacts. She has not eaten a whole lot due to no appetite. She has felt "crummy" but no specific symptoms. Monday she took imodium and it did slow down diarrhea but has had 5 loose stools today. Denies any hematochezia or melena. She denies starting any new medication or eating any new foods. No recent abx use.    .. 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   CKD (chronic kidney disease) stage 3, GFR 30-59 ml/min (HCC) 09/13/2020   Post-menopausal 09/13/2020   Osteopenia 11/09/2020   Hematuria 04/06/2021   Vaginal irritation 04/06/2021   Bilateral lower abdominal pain 04/10/2021   Atypical chest pain 04/10/2021   Epigastric pain 04/10/2021   Dysuria 04/10/2021   AKI (acute kidney injury) (HCC) 04/16/2021   Bacterial vaginosis 05/22/2021   Left leg swelling 05/30/2021   Left lumbar radiculitis 08/28/2022   DDD (degenerative disc disease), lumbar 08/28/2022   Primary osteoarthritis of left knee 11/04/2022   Resolved Ambulatory Problems    Diagnosis Date Noted   BREAST PAIN, LEFT 05/11/2010   Vaginal discharge 12/03/2011   Tibialis posterior tendinitis 12/31/2012   Ingrown right big toenail 05/10/2013   Acute  bilateral low back pain without sciatica 04/10/2021   Leukocytes in urine 04/10/2021   Frequent urination 04/10/2021   Acute left-sided low back pain with left-sided sciatica 08/26/2022   Past Medical History:  Diagnosis Date   Right calcaneal fracture        Observations/Objective: No acute distress Normal mood and appearance Normal breathing  .[name] Today's Vitals   02/25/23 1343  Temp: 98.2 F (36.8 C)  TempSrc: Oral      Assessment and Plan: .Ason was seen today for diarrhea.  Diagnoses and all orders for this visit:  Diarrhea, unspecified type -     diphenoxylate-atropine (LOMOTIL) 2.5-0.025 MG tablet; One to 2 tablets by mouth 4 times a day as needed for diarrhea.   No red flag symptoms with diarrhea  No recent abx use/travel/abdominal pain/melena or hematochezia Stay hydrated Keep a BRAT diet Use lomotil as needed up to 4 times a day Follow up if symptoms persist or worsen  Follow Up Instructions:    I discussed the assessment and treatment plan with the patient. The patient was provided an opportunity to ask questions and all were answered. The patient agreed with the plan and demonstrated an understanding of the instructions.   The patient was advised to call back or seek an in-person evaluation if the symptoms worsen or if the condition fails to improve as anticipated.  Tandy Gaw, PA-C

## 2023-02-25 NOTE — Progress Notes (Unsigned)
Vm box not set up unable to leave vm to return call back to clinic

## 2023-02-27 ENCOUNTER — Other Ambulatory Visit: Payer: Self-pay | Admitting: Family Medicine

## 2023-03-02 ENCOUNTER — Encounter: Payer: Self-pay | Admitting: Physician Assistant

## 2023-03-03 ENCOUNTER — Other Ambulatory Visit (INDEPENDENT_AMBULATORY_CARE_PROVIDER_SITE_OTHER): Payer: PPO

## 2023-03-03 ENCOUNTER — Ambulatory Visit (INDEPENDENT_AMBULATORY_CARE_PROVIDER_SITE_OTHER): Payer: PPO | Admitting: Sports Medicine

## 2023-03-03 DIAGNOSIS — M1712 Unilateral primary osteoarthritis, left knee: Secondary | ICD-10-CM

## 2023-03-03 MED ORDER — HYALURONAN 30 MG/2ML IX SOSY
30.0000 mg | PREFILLED_SYRINGE | Freq: Once | INTRA_ARTICULAR | Status: AC
  Administered 2023-03-03: 30 mg via INTRA_ARTICULAR

## 2023-03-03 NOTE — Progress Notes (Signed)
    Procedures performed today:    Procedure: Real-time Ultrasound Guided aspiration/injection of the left knee Device: Samsung HS60  Verbal informed consent obtained.  Time-out conducted.  Noted no overlying erythema, induration, or other signs of local infection.  Skin prepped in a sterile fashion.  Local anesthesia: Topical Ethyl chloride.  With sterile technique and under real time ultrasound guidance: Effusion noted, I aspirated 16 mL of clear, straw-colored fluid, syringe switched and 30 mg/2 mL of OrthoVisc (sodium hyaluronate) in a prefilled syringe was injected easily into the knee through an 18-gauge needle. Completed without difficulty  Advised to call if fevers/chills, erythema, induration, drainage, or persistent bleeding.  Images permanently stored and available for review in PACS.  Impression: Technically successful ultrasound guided aspiration/injection.  Independent interpretation of notes and tests performed by another provider:   None.  Brief History, Exam, Impression, and Recommendations:    Primary osteoarthritis of left knee Orthovisc No. 3 of 4 left knee, return in 1 week for #4 of 4.    ____________________________________________ [name]. Benjamin Stain, M.D., ABFM., CAQSM., AME. Primary Care and Sports Medicine [facility]  Adjunct Professor of Family Medicine  [place] [employer] [facility]  Restaurant manager, fast food

## 2023-03-03 NOTE — Telephone Encounter (Signed)
I called patient. She states she is feeling a lot better today. She will call back for an appointment if things change for the worse.

## 2023-03-03 NOTE — Assessment & Plan Note (Signed)
Orthovisc No. 3 of 4 left knee, return in 1 week for #4 of 4. 

## 2023-03-03 NOTE — Telephone Encounter (Signed)
Since not better likely needs in person appt this week, since was seen virtually

## 2023-03-03 NOTE — Addendum Note (Signed)
Addended by: Carren Rang A on: 03/03/2023 10:01 AM   Modules accepted: Orders

## 2023-03-04 NOTE — Discharge Instructions (Signed)

## 2023-03-05 ENCOUNTER — Ambulatory Visit
Admission: RE | Admit: 2023-03-05 | Discharge: 2023-03-05 | Disposition: A | Discharge status: Home / Self Care | Payer: PPO | Source: Ambulatory Visit | Attending: Sports Medicine | Admitting: Sports Medicine

## 2023-03-05 DIAGNOSIS — M5416 Radiculopathy, lumbar region: Secondary | ICD-10-CM

## 2023-03-05 MED ORDER — METHYLPREDNISOLONE ACETATE 40 MG/ML INJ SUSP (RADIOLOG
80.0000 mg | Freq: Once | INTRAMUSCULAR | Status: AC
  Administered 2023-03-05: 80 mg via EPIDURAL

## 2023-03-05 MED ORDER — IOPAMIDOL (ISOVUE-M 200) INJECTION 41%
1.0000 mL | Freq: Once | INTRAMUSCULAR | Status: AC
  Administered 2023-03-05: 1 mL via EPIDURAL

## 2023-03-10 ENCOUNTER — Ambulatory Visit: Payer: PPO | Admitting: Sports Medicine

## 2023-03-11 ENCOUNTER — Ambulatory Visit (INDEPENDENT_AMBULATORY_CARE_PROVIDER_SITE_OTHER): Payer: PPO | Admitting: Physician Assistant

## 2023-03-11 ENCOUNTER — Telehealth: Payer: Self-pay

## 2023-03-11 ENCOUNTER — Encounter: Payer: Self-pay | Admitting: Physician Assistant

## 2023-03-11 VITALS — BP 125/74 | HR 65 | Ht 66.0 in | Wt 188.5 lb

## 2023-03-11 DIAGNOSIS — K580 Irritable bowel syndrome with diarrhea: Secondary | ICD-10-CM | POA: Insufficient documentation

## 2023-03-11 DIAGNOSIS — Z1211 Encounter for screening for malignant neoplasm of colon: Secondary | ICD-10-CM

## 2023-03-11 MED ORDER — RIFAXIMIN 550 MG PO TABS: 550.0000 mg | ORAL_TABLET | Freq: Three times a day (TID) | ORAL | 0 refills | Status: DC

## 2023-03-11 NOTE — Patient Instructions (Signed)
Low-FODMAP Eating Plan  FODMAP stands for fermentable oligosaccharides, disaccharides, monosaccharides, and polyols. These are sugars that are hard for some people to digest. A low-FODMAP eating plan may help some people who have irritable bowel syndrome (IBS) and certain other bowel (intestinal) diseases to manage their symptoms. This meal plan can be complicated to follow. Work with a diet and nutrition specialist (dietitian) to make a low-FODMAP eating plan that is right for you. A dietitian can help make sure that you get enough nutrition from this diet. What are tips for following this plan? Reading food labels Check labels for hidden FODMAPs such as: High-fructose syrup. Honey. Agave. Natural fruit flavors. Onion or garlic powder. Choose low-FODMAP foods that contain 3-4 grams of fiber per serving. Check food labels for serving sizes. Eat only one serving at a time to make sure FODMAP levels stay low. Shopping Shop with a list of foods that are recommended on this diet and make a meal plan. Meal planning Follow a low-FODMAP eating plan for up to 6 weeks, or as told by your health care provider or dietitian. To follow the eating plan: Eliminate high-FODMAP foods from your diet completely. Choose only low-FODMAP foods to eat. You will do this for 2-6 weeks. Gradually reintroduce high-FODMAP foods into your diet one at a time. Most people should wait a few days before introducing the next new high-FODMAP food into their meal plan. Your dietitian can recommend how quickly you may reintroduce foods. Keep a daily record of what and how much you eat and drink. Make note of any symptoms that you have after eating. Review your daily record with a dietitian regularly to identify which foods you can eat and which foods you should avoid. General tips Drink enough fluid each day to keep your urine pale yellow. Avoid processed foods. These often have added sugar and may be high in FODMAPs. Avoid  most dairy products, whole grains, and sweeteners. Work with a dietitian to make sure you get enough fiber in your diet. Avoid high FODMAP foods at meals to manage symptoms. Recommended foods Fruits Bananas, oranges, tangerines, lemons, limes, blueberries, raspberries, strawberries, grapes, cantaloupe, honeydew melon, kiwi, papaya, passion fruit, and pineapple. Limited amounts of dried cranberries, banana chips, and shredded coconut. Vegetables Eggplant, zucchini, cucumber, peppers, green beans, bean sprouts, lettuce, arugula, kale, Swiss chard, spinach, collard greens, bok choy, summer squash, potato, and tomato. Limited amounts of corn, carrot, and sweet potato. Green parts of scallions. Grains Gluten-free grains, such as rice, oats, buckwheat, quinoa, corn, polenta, and millet. Gluten-free pasta, bread, or cereal. Rice noodles. Corn tortillas. Meats and other proteins Unseasoned beef, pork, poultry, or fish. Eggs. Bacon. Tofu (firm) and tempeh. Limited amounts of nuts and seeds, such as almonds, walnuts, brazil nuts, pecans, peanuts, nut butters, pumpkin seeds, chia seeds, and sunflower seeds. Dairy Lactose-free milk, yogurt, and kefir. Lactose-free cottage cheese and ice cream. Non-dairy milks, such as almond, coconut, hemp, and rice milk. Non-dairy yogurt. Limited amounts of goat cheese, brie, mozzarella, parmesan, swiss, and other hard cheeses. Fats and oils Butter-free spreads. Vegetable oils, such as olive, canola, and sunflower oil. Seasoning and other foods Artificial sweeteners with names that do not end in "ol," such as aspartame, saccharine, and stevia. Maple syrup, white table sugar, raw sugar, brown sugar, and molasses. Mayonnaise, soy sauce, and tamari. Fresh basil, coriander, parsley, rosemary, and thyme. Beverages Water and mineral water. Sugar-sweetened soft drinks. Small amounts of orange juice or cranberry juice. Black and green tea. Most dry wines.   Coffee. The items listed  above may not be a complete list of foods and beverages you can eat. Contact a dietitian for more information. Foods to avoid Fruits Fresh, dried, and juiced forms of apple, pear, watermelon, peach, plum, cherries, apricots, blackberries, boysenberries, figs, nectarines, and mango. Avocado. Vegetables Chicory root, artichoke, asparagus, cabbage, snow peas, Brussels sprouts, broccoli, sugar snap peas, mushrooms, celery, and cauliflower. Onions, garlic, leeks, and the white part of scallions. Grains Wheat, including kamut, durum, and semolina. Barley and bulgur. Couscous. Wheat-based cereals. Wheat noodles, bread, crackers, and pastries. Meats and other proteins Fried or fatty meat. Sausage. Cashews and pistachios. Soybeans, baked beans, black beans, chickpeas, kidney beans, fava beans, navy beans, lentils, black-eyed peas, and split peas. Dairy Milk, yogurt, ice cream, and soft cheese. Cream and sour cream. Milk-based sauces. Custard. Buttermilk. Soy milk. Seasoning and other foods Any sugar-free gum or candy. Foods that contain artificial sweeteners such as sorbitol, mannitol, isomalt, or xylitol. Foods that contain honey, high-fructose corn syrup, or agave. Bouillon, vegetable stock, beef stock, and chicken stock. Garlic and onion powder. Condiments made with onion, such as hummus, chutney, pickles, relish, salad dressing, and salsa. Tomato paste. Beverages Chicory-based drinks. Coffee substitutes. Chamomile tea. Fennel tea. Sweet or fortified wines such as port or sherry. Diet soft drinks made with isomalt, mannitol, maltitol, sorbitol, or xylitol. Apple, pear, and mango juice. Juices with high-fructose corn syrup. The items listed above may not be a complete list of foods and beverages you should avoid. Contact a dietitian for more information. Summary FODMAP stands for fermentable oligosaccharides, disaccharides, monosaccharides, and polyols. These are sugars that are hard for some people to  digest. A low-FODMAP eating plan is a short-term diet that helps to ease symptoms of certain bowel diseases. The eating plan usually lasts up to 6 weeks. After that, high-FODMAP foods are reintroduced gradually and one at a time. This can help you find out which foods may be causing symptoms. A low-FODMAP eating plan can be complicated. It is best to work with a dietitian who has experience with this type of plan. This information is not intended to replace advice given to you by your health care provider. Make sure you discuss any questions you have with your health care provider. Document Revised: 09/02/2019 Document Reviewed: 09/02/2019 Elsevier Patient Education  2024 Elsevier Inc.  

## 2023-03-11 NOTE — Progress Notes (Unsigned)
   Established Patient Office Visit  Subjective   Patient ID: Kristine Bowman, female    DOB: [birth year]  Age: 67 y.o. MRN: [id number]  No chief complaint on file.   HPI IBS  2016 polyps colonoscopy  Gluten Free diet since July  Amoxicillin for root canal  {History (Optional):23778}  ROS    Objective:     There were no vitals taken for this visit. {Vitals History (Optional):23777}  Physical Exam   No results found for any visits on 03/11/23.  {Labs (Optional):23779}  The 10-year ASCVD risk score (Arnett DK, et al., 2019) is: 8.7%    Assessment & Plan:   Problem List Items Addressed This Visit   None   No follow-ups on file.    Tandy Gaw, PA-C

## 2023-03-11 NOTE — Telephone Encounter (Signed)
Initiated Prior authorization YIR:[id number] 550MG  tablets  Via: Covermymeds Case/Key:[id number] Status: Pending as of Reason: Notified Pt via: Mychart

## 2023-03-12 ENCOUNTER — Encounter: Payer: Self-pay | Admitting: Physician Assistant

## 2023-03-12 LAB — CMP14+EGFR
ALT: 21 [IU]/L (ref 0–32)
AST: 13 [IU]/L (ref 0–40)
Albumin: 3.9 g/dL (ref 3.9–4.9)
Alkaline Phosphatase: 60 [IU]/L (ref 44–121)
BUN/Creatinine Ratio: 14 (ref 12–28)
BUN: 16 mg/dL (ref 8–27)
Bilirubin Total: 0.4 mg/dL (ref 0.0–1.2)
CO2: 22 mmol/L (ref 20–29)
Calcium: 8.9 mg/dL (ref 8.7–10.3)
Chloride: 106 mmol/L (ref 96–106)
Creatinine, Ser: 1.16 mg/dL — ABNORMAL HIGH (ref 0.57–1.00)
Globulin, Total: 2.1 g/dL (ref 1.5–4.5)
Glucose: 66 mg/dL — ABNORMAL LOW (ref 70–99)
Potassium: 4.2 mmol/L (ref 3.5–5.2)
Sodium: 140 mmol/L (ref 134–144)
Total Protein: 6 g/dL (ref 6.0–8.5)
eGFR: 51 mL/min/{1.73_m2} — ABNORMAL LOW (ref >59)

## 2023-03-12 LAB — CBC WITH DIFFERENTIAL/PLATELET
Basophils Absolute: 0.1 10*3/uL (ref 0.0–0.2)
Basos: 1 %
EOS (ABSOLUTE): 0.2 10*3/uL (ref 0.0–0.4)
Eos: 3 %
Hematocrit: 37.3 % (ref 34.0–46.6)
Hemoglobin: 12.2 g/dL (ref 11.1–15.9)
Immature Grans (Abs): 0 10*3/uL (ref 0.0–0.1)
Immature Granulocytes: 0 %
Lymphocytes Absolute: 1.4 10*3/uL (ref 0.7–3.1)
Lymphs: 23 %
MCH: 31.4 pg (ref 26.6–33.0)
MCHC: 32.7 g/dL (ref 31.5–35.7)
MCV: 96 fL (ref 79–97)
Monocytes Absolute: 0.9 10*3/uL (ref 0.1–0.9)
Monocytes: 15 %
Neutrophils Absolute: 3.5 10*3/uL (ref 1.4–7.0)
Neutrophils: 58 %
Platelets: 268 10*3/uL (ref 150–450)
RBC: 3.89 x10E6/uL (ref 3.77–5.28)
RDW: 12.2 % (ref 11.7–15.4)
WBC: 6 10*3/uL (ref 3.4–10.8)

## 2023-03-12 LAB — TSH: TSH: 1.34 u[IU]/mL (ref 0.450–4.500)

## 2023-03-12 LAB — LIPASE: Lipase: 33 U/L (ref 14–72)

## 2023-03-12 NOTE — Progress Notes (Signed)
Kristine Bowman,   Pancreatic enzyme normal.  Thyroid looks great.  Hemoglobin normal.  Kidney function stable.  Liver function looks great.  Glucose a little on low side make sure eating small frequent snacks/meals.

## 2023-03-14 ENCOUNTER — Ambulatory Visit: Payer: PPO | Admitting: Sports Medicine

## 2023-03-19 ENCOUNTER — Ambulatory Visit (INDEPENDENT_AMBULATORY_CARE_PROVIDER_SITE_OTHER): Payer: PPO | Admitting: Sports Medicine

## 2023-03-19 ENCOUNTER — Encounter: Payer: Self-pay | Admitting: Sports Medicine

## 2023-03-19 ENCOUNTER — Other Ambulatory Visit (INDEPENDENT_AMBULATORY_CARE_PROVIDER_SITE_OTHER): Payer: PPO

## 2023-03-19 DIAGNOSIS — M5416 Radiculopathy, lumbar region: Secondary | ICD-10-CM

## 2023-03-19 DIAGNOSIS — M1712 Unilateral primary osteoarthritis, left knee: Secondary | ICD-10-CM

## 2023-03-19 MED ORDER — HYALURONAN 30 MG/2ML IX SOSY
30.0000 mg | PREFILLED_SYRINGE | Freq: Once | INTRA_ARTICULAR | Status: AC
  Administered 2023-03-19: 30 mg via INTRA_ARTICULAR

## 2023-03-19 NOTE — Assessment & Plan Note (Signed)
Ortho is #4 of 4 left knee, no pain, return as needed.

## 2023-03-19 NOTE — Progress Notes (Signed)
    Procedures performed today:    Procedure: Real-time Ultrasound Guided injection of the left knee Device: Samsung HS60  Verbal informed consent obtained.  Time-out conducted.  Noted no overlying erythema, induration, or other signs of local infection.  Skin prepped in a sterile fashion.  Local anesthesia: Topical Ethyl chloride.  With sterile technique and under real time ultrasound guidance: Moderate effusion noted, 30 mg/2 mL of OrthoVisc (sodium hyaluronate) in a prefilled syringe was injected easily into the knee through a 22-gauge needle. Completed without difficulty  Advised to call if fevers/chills, erythema, induration, drainage, or persistent bleeding.  Images permanently stored and available for review in PACS.  Impression: Technically successful ultrasound guided injection.  Independent interpretation of notes and tests performed by another provider:   None.  Brief History, Exam, Impression, and Recommendations:    Primary osteoarthritis of left knee Ortho is #4 of 4 left knee, no pain, return as needed.  Left lumbar radiculitis History of left lumbar radiculitis, she did not tolerate gabapentin, Lyrica, physical therapy, MRI did show large left L5-S1 facet cyst impinging the spinal canal as well as foraminal stenosis at multiple levels. She initially did well with a facet cyst aspiration and injection as well as an epidural. Doing extremely well with her recent repeat left L5-S1 transforaminal epidural. Return as needed.    ____________________________________________ [name]. Benjamin Stain, M.D., ABFM., CAQSM., AME. Primary Care and Sports Medicine [facility]  Adjunct Professor of Family Medicine  [place] [employer]  Restaurant manager, fast food

## 2023-03-19 NOTE — Assessment & Plan Note (Signed)
History of left lumbar radiculitis, she did not tolerate gabapentin, Lyrica, physical therapy, MRI did show large left L5-S1 facet cyst impinging the spinal canal as well as foraminal stenosis at multiple levels. She initially did well with a facet cyst aspiration and injection as well as an epidural. Doing extremely well with her recent repeat left L5-S1 transforaminal epidural. Return as needed.

## 2023-04-11 ENCOUNTER — Other Ambulatory Visit: Payer: Self-pay

## 2023-04-11 MED ORDER — IBUPROFEN 800 MG PO TABS: 800.0000 mg | ORAL_TABLET | Freq: Three times a day (TID) | ORAL | 0 refills | Status: DC | PRN

## 2023-04-11 NOTE — Telephone Encounter (Signed)
Requesting rx rf of  Ibupforen 800mg  Last written 01/31/2023 Last OV 03/19/2023 with Dr. Karie Schwalbe Upcoming appt 06/22/22 AWV

## 2023-06-20 ENCOUNTER — Other Ambulatory Visit: Payer: Self-pay | Admitting: Family Medicine

## 2023-06-20 NOTE — Telephone Encounter (Signed)
Copied from CRM [id number]. Topic: Clinical - Medication Refill >> Jun 20, 2023 12:00 PM Suzette B wrote: Most Recent Primary Care Visit:  Provider: Monica Becton  Department: [facility]  Visit Type: OFFICE VISIT  Date: 03/19/2023  Medication: ibuprofen (ADVIL) 800 MG tablet  Has the patient contacted their pharmacy? Yes (Agent: If no, request that the patient contact the pharmacy for the refill. If patient does not wish to contact the pharmacy document the reason why and proceed with request.) (Agent: If yes, when and what did the pharmacy advise?)  Is this the correct pharmacy for this prescription? Yes If no, delete pharmacy and type the correct one.  This is the patient's preferred pharmacy:  [facility] [phone] - [place], [place] - [address] [place] [address] Phone: [phone] Fax: [phone]   Has the prescription been filled recently? Yes  Is the patient out of the medication? Yes  Has the patient been seen for an appointment in the last year OR does the patient have an upcoming appointment? Yes  Can we respond through MyChart? Yes  Agent: Please be advised that Rx refills may take up to 3 business days. We ask that you follow-up with your pharmacy.

## 2023-06-23 NOTE — Telephone Encounter (Signed)
 Requested medication (s) are due for refill today: routing for review  Requested medication (s) are on the active medication list: yes  Last refill:  04/11/23  Future visit scheduled: no  Notes to clinic:  Unable to refill per protocol, last refill by another provider not at [facility]     Requested Prescriptions  Pending Prescriptions Disp Refills   ibuprofen (ADVIL) 800 MG tablet 90 tablet 0    Sig: Take 1 tablet (800 mg total) by mouth every 8 (eight) hours as needed.     Analgesics:  NSAIDS Failed - 06/23/2023  9:05 AM      Failed - Manual Review: Labs are only required if the patient has taken medication for more than 8 weeks.      Failed - Cr in normal range and within 360 days    Creat  Date Value Ref Range Status  04/17/2021 1.14 (H) 0.50 - 1.05 mg/dL Final   Creatinine, Ser  Date Value Ref Range Status  03/11/2023 1.16 (H) 0.57 - 1.00 mg/dL Final   Creatinine, Urine  Date Value Ref Range Status  04/17/2021 42 20 - 275 mg/dL Final         Passed - HGB in normal range and within 360 days    Hemoglobin  Date Value Ref Range Status  03/11/2023 12.2 11.1 - 15.9 g/dL Final         Passed - PLT in normal range and within 360 days    Platelets  Date Value Ref Range Status  03/11/2023 268 150 - 450 x10E3/uL Final         Passed - HCT in normal range and within 360 days    Hematocrit  Date Value Ref Range Status  03/11/2023 37.3 34.0 - 46.6 % Final         Passed - eGFR is 30 or above and within 360 days    GFR, Est African American  Date Value Ref Range Status  09/26/2020 58 (L) > OR = 60 mL/min/1.39m2 Final   GFR, Est Non African American  Date Value Ref Range Status  09/26/2020 50 (L) > OR = 60 mL/min/1.7m2 Final   eGFR  Date Value Ref Range Status  03/11/2023 51 (L) >59 mL/min/1.73 Final         Passed - Patient is not pregnant      Passed - Valid encounter within last 12 months    Recent Outpatient Visits           3 months ago Primary  osteoarthritis of left knee   [facility] at [facility], [name], MD   3 months ago Irritable bowel syndrome with diarrhea   [facility] at [facility], Lonna Cobb, PA-C   3 months ago Primary osteoarthritis of left knee   [facility] at [facility], [name], MD   3 months ago Diarrhea, unspecified type   [facility] at [facility], Lonna Cobb, PA-C   3 months ago Left lumbar radiculitis   [facility] at [facility], [name], MD

## 2023-06-24 ENCOUNTER — Other Ambulatory Visit: Payer: Self-pay | Admitting: Family Medicine

## 2023-06-24 MED ORDER — IBUPROFEN 800 MG PO TABS: 800.0000 mg | ORAL_TABLET | Freq: Three times a day (TID) | ORAL | 0 refills | Status: DC | PRN

## 2023-06-24 NOTE — Telephone Encounter (Signed)
 Patient following up on refill on her Ibuprofen 800mg .

## 2023-06-25 ENCOUNTER — Telehealth: Payer: Self-pay

## 2023-06-25 NOTE — Telephone Encounter (Signed)
 Copied from CRM [id number]. Topic: Clinical - Prescription Issue >> Jun 24, 2023  4:31 PM Kristine Bowman wrote: Reason for CRM: ibuprofen (ADVIL) 800 MG tablet- still waiting for approval for fill

## 2023-06-25 NOTE — Telephone Encounter (Signed)
 Refill for ibuprofen was sent to pharmacy yesterday.

## 2023-07-07 ENCOUNTER — Encounter (INDEPENDENT_AMBULATORY_CARE_PROVIDER_SITE_OTHER): Payer: Self-pay | Admitting: Sports Medicine

## 2023-07-07 DIAGNOSIS — M5416 Radiculopathy, lumbar region: Secondary | ICD-10-CM

## 2023-07-09 NOTE — Telephone Encounter (Signed)

## 2023-07-15 ENCOUNTER — Other Ambulatory Visit (INDEPENDENT_AMBULATORY_CARE_PROVIDER_SITE_OTHER): Payer: Self-pay

## 2023-07-15 ENCOUNTER — Ambulatory Visit (INDEPENDENT_AMBULATORY_CARE_PROVIDER_SITE_OTHER): Admitting: Sports Medicine

## 2023-07-15 DIAGNOSIS — M1712 Unilateral primary osteoarthritis, left knee: Secondary | ICD-10-CM

## 2023-07-15 MED ORDER — TRIAMCINOLONE ACETONIDE 40 MG/ML IJ SUSP
40.0000 mg | Freq: Once | INTRAMUSCULAR | Status: AC
  Administered 2023-07-15: 40 mg via INTRAMUSCULAR

## 2023-07-15 NOTE — Assessment & Plan Note (Signed)
 This is a very pleasant 68 year old female, she is status post a series of Orthovisc, we started Orthovisc back in October of last year, she did well until recently, now having increasing pain left knee mild swelling, medial joint line. She is requesting an injection, we did a joint injection today, I will see her back in about 6 weeks, we can consider restarting Visco if she still is not better.

## 2023-07-15 NOTE — Progress Notes (Signed)
    Procedures performed today:    Procedure: Real-time Ultrasound Guided injection of the left knee Device: Samsung HS60  Verbal informed consent obtained.  Time-out conducted.  Noted no overlying erythema, induration, or other signs of local infection.  Skin prepped in a sterile fashion.  Local anesthesia: Topical Ethyl chloride.  With sterile technique and under real time ultrasound guidance: Moderate effusion noted, 1 cc Kenalog 40, 2 cc lidocaine, 2 cc bupivacaine injected easily Completed without difficulty  Advised to call if fevers/chills, erythema, induration, drainage, or persistent bleeding.  Images permanently stored and available for review in PACS.  Impression: Technically successful ultrasound guided injection.  Independent interpretation of notes and tests performed by another provider:   None.  Brief History, Exam, Impression, and Recommendations:    Primary osteoarthritis of left knee This is a very pleasant 68 year old female, she is status post a series of Orthovisc, we started Orthovisc back in October of last year, she did well until recently, now having increasing pain left knee mild swelling, medial joint line. She is requesting an injection, we did a joint injection today, I will see her back in about 6 weeks, we can consider restarting Visco if she still is not better.    ____________________________________________ [name]. Benjamin Stain, M.D., ABFM., CAQSM., AME. Primary Care and Sports Medicine [facility]  Adjunct Professor of Family Medicine  [employer]  Restaurant manager, fast food

## 2023-07-16 NOTE — Discharge Instructions (Signed)

## 2023-07-17 ENCOUNTER — Ambulatory Visit
Admission: RE | Admit: 2023-07-17 | Discharge: 2023-07-17 | Disposition: A | Discharge status: Home / Self Care | Source: Ambulatory Visit | Attending: Sports Medicine | Admitting: Sports Medicine

## 2023-07-17 DIAGNOSIS — M5416 Radiculopathy, lumbar region: Secondary | ICD-10-CM

## 2023-07-17 MED ORDER — METHYLPREDNISOLONE ACETATE 40 MG/ML INJ SUSP (RADIOLOG
80.0000 mg | Freq: Once | INTRAMUSCULAR | Status: AC
  Administered 2023-07-17: 80 mg via EPIDURAL

## 2023-07-17 MED ORDER — IOPAMIDOL (ISOVUE-M 200) INJECTION 41%
1.0000 mL | Freq: Once | INTRAMUSCULAR | Status: AC
  Administered 2023-07-17: 1 mL via EPIDURAL

## 2023-07-24 ENCOUNTER — Other Ambulatory Visit

## 2023-07-25 ENCOUNTER — Other Ambulatory Visit

## 2023-08-25 ENCOUNTER — Other Ambulatory Visit: Payer: Self-pay | Admitting: Family Medicine

## 2023-09-24 ENCOUNTER — Other Ambulatory Visit: Payer: Self-pay | Admitting: Family Medicine

## 2023-10-20 ENCOUNTER — Encounter: Payer: Self-pay | Admitting: Physician Assistant

## 2023-11-26 ENCOUNTER — Encounter: Payer: Self-pay | Admitting: Physician Assistant

## 2023-11-27 ENCOUNTER — Ambulatory Visit: Admitting: Medical-Surgical

## 2023-11-27 ENCOUNTER — Ambulatory Visit

## 2023-11-27 ENCOUNTER — Ambulatory Visit: Payer: Self-pay

## 2023-11-27 VITALS — BP 99/65 | HR 82 | Resp 20 | Ht 66.0 in | Wt 195.1 lb

## 2023-11-27 DIAGNOSIS — S99922A Unspecified injury of left foot, initial encounter: Secondary | ICD-10-CM | POA: Diagnosis not present

## 2023-11-27 DIAGNOSIS — L89899 Pressure ulcer of other site, unspecified stage: Secondary | ICD-10-CM

## 2023-11-27 MED ORDER — IBUPROFEN 800 MG PO TABS
800.0000 mg | ORAL_TABLET | Freq: Three times a day (TID) | ORAL | 0 refills | Status: DC | PRN
Start: 2023-11-27 — End: 2024-02-17

## 2023-11-27 NOTE — Telephone Encounter (Signed)
 FYI Only or Action Required?: FYI only for provider.  Patient was last seen in primary care on 07/15/2023 by Curtis Debby PARAS, MD.  Called Nurse Triage reporting Foot Injury.  Symptoms began several weeks ago.  Interventions attempted: OTC medications: ibuprofen  , Rest, hydration, or home remedies, and Ice/heat application.  Symptoms are: gradually improving.  Triage Disposition: See PCP When Office is Open (Within 3 Days)  Patient/caregiver understands and will follow disposition?: Yes     Copied from CRM #[id number]. Topic: Clinical - Red Word Triage >> Nov 27, 2023  8:19 AM Miquel SAILOR wrote: Red Word that prompted transfer to Nurse Triage: LT foot 2 weeks spranined/Toe bends bruised will in pain. 5/10 for pain level Reason for Disposition  [1] After 2 weeks AND [2] still painful or swollen  Answer Assessment - Initial Assessment Questions 1. MECHANISM: How did the injury happen? (e.g., twisting injury, direct blow)      Hit foot on concrete 2. ONSET: When did the injury happen? (e.g., minutes or hours ago)      2 weeks ago 3. LOCATION: Where is the injury located?      Second toe and ball of foot on left foot 4. APPEARANCE of INJURY: What does the injury look like?      Looks normal now  5. WEIGHT-BEARING: Can you put weight on that foot? Can you walk (four steps or more)?       Yes walking 6. SIZE: For cuts, bruises, or swelling, ask: How large is it? (e.g., inches or centimeters;  entire joint)      no 7. PAIN: Is there pain? If Yes, ask: How bad is the pain? What does it keep you from doing? (Scale 0-10; or none, mild, moderate, severe)     5/10  9. OTHER SYMPTOMS: Do you have any other symptoms?      Swelling intermittent  Protocols used: Foot Injury-A-AH

## 2023-11-27 NOTE — Progress Notes (Signed)
        Established patient visit   History of Present Illness   Discussed the use of AI scribe software for clinical note transcription with the patient, who gave verbal consent to proceed.  History of Present Illness   Kristine Bowman is a 68 year old female who presents with persistent left foot pain and swelling in the second toe.  She has experienced pain and swelling in the second toe for two and a half weeks following an injury where she jammed her toe into the steps. The pain is located at the base of the second toe, with swelling.  The pain has slightly improved but remains significant, especially after walking a mile or standing for long periods. She rates the pain as 4 or 5 out of 10. Tenderness is present at the joint and sometimes at the tip of the toe, with no pain in the ankle or other toes.  She takes ibuprofen  800 mg every eight hours as needed for pain management. She is nearing the end of her ibuprofen  supply. There is no numbness or other concerning symptoms. Her activity level is limited by the pain, affecting her ability to walk long distances.      Physical Exam   Physical Exam Vitals reviewed.  Constitutional:      General: She is not in acute distress.    Appearance: Normal appearance.  HENT:     Head: Normocephalic and atraumatic.  Cardiovascular:     Rate and Rhythm: Normal rate and regular rhythm.  Pulmonary:     Effort: Pulmonary effort is normal. No respiratory distress.  Musculoskeletal:     Right foot: Normal.     Left foot: Decreased range of motion. Normal capillary refill. Swelling (second toe) and tenderness present. Normal pulse.  Skin:    General: Skin is warm and dry.  Neurological:     Mental Status: She is alert and oriented to person, place, and time.  Psychiatric:        Mood and Affect: Mood normal.        Behavior: Behavior normal.        Thought Content: Thought content normal.        Judgment: Judgment normal.     Assessment &  Plan   Assessment and Plan    Left foot and second toe pain and swelling after trauma Persistent pain and swelling in the left foot and second toe post-trauma.  - Order x-ray of the left foot to assess for fracture. - Refill ibuprofen  800 mg, instruct to take as needed for pain. - Offered additional pain medication, patient declined. - Discussed potential gastrointestinal issues with prolonged ibuprofen  use.      Follow up   Return for left foot/toe pain in 4-6 weeks if no improvement.  __________________________________ Zada FREDRIK Palin, DNP, APRN, FNP-BC Primary Care and Sports Medicine [facility]

## 2023-12-01 ENCOUNTER — Other Ambulatory Visit: Payer: Self-pay | Admitting: Physician Assistant

## 2023-12-06 ENCOUNTER — Encounter: Payer: Self-pay | Admitting: Medical-Surgical

## 2023-12-08 ENCOUNTER — Ambulatory Visit: Payer: Self-pay | Admitting: Medical-Surgical

## 2023-12-08 NOTE — Telephone Encounter (Signed)
 Results of foot x-ray reviewed by patient

## 2023-12-09 ENCOUNTER — Encounter: Payer: Self-pay | Admitting: Sports Medicine

## 2023-12-09 NOTE — Telephone Encounter (Signed)
 Looks like it was just a steroid injection, you can book her at my next available opening.

## 2023-12-16 ENCOUNTER — Other Ambulatory Visit (HOSPITAL_BASED_OUTPATIENT_CLINIC_OR_DEPARTMENT_OTHER): Payer: Self-pay | Admitting: Family Medicine

## 2023-12-16 ENCOUNTER — Ambulatory Visit: Admitting: Sports Medicine

## 2023-12-16 DIAGNOSIS — Z1231 Encounter for screening mammogram for malignant neoplasm of breast: Secondary | ICD-10-CM

## 2023-12-18 ENCOUNTER — Ambulatory Visit

## 2023-12-18 DIAGNOSIS — Z1231 Encounter for screening mammogram for malignant neoplasm of breast: Secondary | ICD-10-CM | POA: Diagnosis not present

## 2023-12-19 NOTE — Progress Notes (Unsigned)
 See telephone note of visit.  .. Results for orders placed or performed in visit on 04/10/21  POCT URINALYSIS DIP (CLINITEK)   Collection Time: 04/10/21  2:36 PM  Result Value Ref Range   Color, UA yellow yellow   Clarity, UA clear clear   Glucose, UA negative negative mg/dL   Bilirubin, UA negative negative   Ketones, POC UA negative negative mg/dL   Spec Grav, UA 8.979 8.989 - 1.025   Blood, UA negative negative   pH, UA 5.5 5.0 - 8.0   POC PROTEIN,UA negative negative, trace   Urobilinogen, UA 0.2 0.2 or 1.0 E.U./dL   Nitrite, UA Negative Negative   Leukocytes, UA Small (1+) (A) Negative   NSR with no ST elevation or depression.

## 2023-12-23 ENCOUNTER — Ambulatory Visit: Admitting: Sports Medicine

## 2023-12-24 ENCOUNTER — Ambulatory Visit: Payer: Self-pay | Admitting: Family Medicine

## 2023-12-24 ENCOUNTER — Other Ambulatory Visit: Payer: Self-pay

## 2023-12-24 ENCOUNTER — Ambulatory Visit: Admitting: Family Medicine

## 2023-12-24 VITALS — BP 110/82 | Ht 66.0 in | Wt 192.0 lb

## 2023-12-24 DIAGNOSIS — M1712 Unilateral primary osteoarthritis, left knee: Secondary | ICD-10-CM | POA: Diagnosis not present

## 2023-12-24 MED ORDER — METHYLPREDNISOLONE ACETATE 40 MG/ML IJ SUSP
40.0000 mg | Freq: Once | INTRAMUSCULAR | Status: AC
  Administered 2023-12-24: 40 mg via INTRA_ARTICULAR

## 2023-12-24 NOTE — Progress Notes (Signed)
 Hi Tam, they are questioning an area right breast.breast.   RECOMMENDATION: Diagnostic mammogram and possibly ultrasound of the right breast.   She will be contacted regarding the findings, and additional imaging will be scheduled.

## 2023-12-24 NOTE — Progress Notes (Signed)
 PCP: [name], MD  Subjective:   HPI: Patient is a 68 y.o. female here for left knee arthritis.  Patient has known history of arthritis in left knee. Has done well previously with visco and steroid injections. Last steroid injection lasted about 6 months, interested in repeating this today. No new injury. Pain is anteromedial but also posterior in left knee. Associated swelling.  Past Medical History:  Diagnosis Date   KNEE PAIN 06/26/2010   Qualifier: Diagnosis of  By: Alvan MD, Catherine     Right calcaneal fracture    SHOULDER PAIN, RIGHT 07/09/2010   Qualifier: Diagnosis of  By: Alvan MD, Dorothyann      Current Outpatient Medications on File Prior to Visit  Medication Sig Dispense Refill   ibuprofen  (ADVIL ) 800 MG tablet Take 1 tablet (800 mg total) by mouth every 8 (eight) hours as needed. 90 tablet 0   No current facility-administered medications on file prior to visit.    Past Surgical History:  Procedure Laterality Date   COLONOSCOPY     ORIF CALCANEOUS FRACTURE Right 07/27/2020   Procedure: OPEN REDUCTION INTERNAL FIXATION (ORIF) CALCANEOUS FRACTURE;  Surgeon: Kit Rush, MD;  Location: [facility];  Service: Orthopedics;  Laterality: Right;     Allergies  Allergen Reactions   Oxycodone  Nausea Only   Betadine [Povidone Iodine]    Codeine    Hydrocodone     Other Reaction(s): GI UPSET   Metronidazole      Vaginal burning/yeast infection   Tramadol  Nausea And Vomiting   Latex Itching and Rash   Meloxicam  Itching and Rash    BP 110/82   Ht 5' 6 (1.676 m)   Wt 192 lb (87.1 kg)   BMI 30.99 kg/m       No data to display              No data to display              Objective:  Physical Exam:  Gen: NAD, comfortable in exam room  Left knee: Mild effusion.  No other gross deformity, ecchymoses. TTP medial joint line. FROM. Negative ant/post drawers. Negative valgus/varus testing. Negative  lachman. NV intact distally.   Assessment & Plan:  1. Left knee pain - due to arthritis.  Repeated steroid injection today.  Quad strengthening, otc medications as needed.  Consider repeating viscosupplementation in future.  After informed written consent timeout was performed, patient was lying supine on exam table. Left knee was prepped with alcohol swab and utilizing superolateral approach with ultrasound guidance, patient's left knee was injected intraarticularly with 3:1 lidocaine : depomedrol. Patient tolerated the procedure well without immediate complications.

## 2023-12-30 ENCOUNTER — Encounter: Payer: Self-pay | Admitting: Sports Medicine

## 2024-01-02 ENCOUNTER — Other Ambulatory Visit: Payer: Self-pay | Admitting: Family Medicine

## 2024-01-02 DIAGNOSIS — R928 Other abnormal and inconclusive findings on diagnostic imaging of breast: Secondary | ICD-10-CM

## 2024-01-06 ENCOUNTER — Other Ambulatory Visit: Payer: Self-pay | Admitting: Family Medicine

## 2024-01-06 ENCOUNTER — Inpatient Hospital Stay
Admission: RE | Admit: 2024-01-06 | Discharge: 2024-01-06 | Discharge status: Home / Self Care | Source: Ambulatory Visit | Attending: Family Medicine | Admitting: Family Medicine

## 2024-01-06 ENCOUNTER — Ambulatory Visit
Admission: RE | Admit: 2024-01-06 | Discharge: 2024-01-06 | Disposition: A | Discharge status: Home / Self Care | Source: Ambulatory Visit | Attending: Family Medicine | Admitting: Family Medicine

## 2024-01-06 DIAGNOSIS — R928 Other abnormal and inconclusive findings on diagnostic imaging of breast: Secondary | ICD-10-CM

## 2024-01-06 DIAGNOSIS — N6310 Unspecified lump in the right breast, unspecified quadrant: Secondary | ICD-10-CM

## 2024-01-13 ENCOUNTER — Other Ambulatory Visit: Payer: Self-pay | Admitting: Family Medicine

## 2024-01-13 ENCOUNTER — Ambulatory Visit
Admission: RE | Admit: 2024-01-13 | Discharge: 2024-01-13 | Disposition: A | Discharge status: Home / Self Care | Source: Ambulatory Visit | Attending: Family Medicine | Admitting: Family Medicine

## 2024-01-13 DIAGNOSIS — N6310 Unspecified lump in the right breast, unspecified quadrant: Secondary | ICD-10-CM

## 2024-01-13 HISTORY — PX: BREAST BIOPSY: SHX20

## 2024-01-15 ENCOUNTER — Encounter: Payer: Self-pay | Admitting: *Deleted

## 2024-01-15 LAB — SURGICAL PATHOLOGY

## 2024-01-16 ENCOUNTER — Telehealth: Payer: Self-pay | Admitting: *Deleted

## 2024-01-16 NOTE — Telephone Encounter (Signed)
 LVM about upcoming morning [facility] clinic appointment on 9/24, paperwork will be sent via email  Gave location and time, also informed patient that the surgeon's office would be calling as well to get information from them similar to the packet that they will be receiving so make sure to do both.  Reminded patient that all providers will be coming to the clinic to see them HERE and if they had any questions to not hesitate to reach back out to myself or their navigators.

## 2024-01-19 ENCOUNTER — Encounter: Payer: Self-pay | Admitting: *Deleted

## 2024-01-19 ENCOUNTER — Telehealth: Payer: Self-pay | Admitting: *Deleted

## 2024-01-19 ENCOUNTER — Other Ambulatory Visit: Payer: Self-pay | Admitting: *Deleted

## 2024-01-19 DIAGNOSIS — Z170 Estrogen receptor positive status [ER+]: Secondary | ICD-10-CM | POA: Insufficient documentation

## 2024-01-19 DIAGNOSIS — C50811 Malignant neoplasm of overlapping sites of right female breast: Secondary | ICD-10-CM | POA: Insufficient documentation

## 2024-01-19 NOTE — Telephone Encounter (Signed)
 Spoke to patient to confirm upcoming morning [facility] clinic appointment on 9/24, paperwork will be sent via email  Gave location and time, also informed patient that the surgeon's office would be calling as well to get information from them similar to the packet that they will be receiving so make sure to do both.  Reminded patient that all providers will be coming to the clinic to see them HERE and if they had any questions to not hesitate to reach back out to myself or their navigators.

## 2024-01-20 ENCOUNTER — Other Ambulatory Visit: Payer: Self-pay | Admitting: General Surgery

## 2024-01-20 DIAGNOSIS — Z170 Estrogen receptor positive status [ER+]: Secondary | ICD-10-CM

## 2024-01-20 NOTE — Progress Notes (Signed)
 Radiation Oncology         [phone] ________________________________  Multidisciplinary Breast Oncology Clinic [facility]) Initial Outpatient Consultation  Name: Valisha Heslin MRN: [id number]  Date: 01/21/2024  DOB: 1955/12/13  RR:Fzuyzwzb, Dorothyann BIRCH, MD  Aron Shoulders, MD   REFERRING PHYSICIAN: Aron Shoulders, MD  DIAGNOSIS: There were no encounter diagnoses.  Stage *** Right Breast, Overlapping Sites (two masses), Invasive and in situ ductal carcinoma, ER+ / PR+ / Her2-, Grade 2 (one mass is PR- and the other is PR+)  No diagnosis found.  HISTORY OF PRESENT ILLNESS::Kristine Bowman is a 68 y.o. female who is presenting to the office today for evaluation of her newly diagnosed breast cancer. She is accompanied by ***. She is doing well overall.   She had routine screening mammography on 12/18/23 showing a possible abnormality in the right breast. She underwent a right breast diagnostic mammography with tomography and right breast ultrasonography at [facility] on 01/06/24 which further revealed: an indeterminate 6 mm mass in the 10 o'clock right breast located 8 cmfn, correlating with the screening mammographic findings; an additional 12 mm mass in the 10 o'clock right breast located 7 cmfn (demonstrated sonographically); and an additional sonographically identified 6 mm mass in the 12 o'clock right breast possibly reflecting a complicated cyst. Imaging otherwise showed no evidence of right axillary lymphadenopathy.   Biopsies of the two 10 o'clock right breast masses located 8 cmfn and 7 cmfn on 01/13/24 both showed: grade 2 invasive ductal carcinoma measuring 0.5 cm and 0.4 cm in the greatest linear extent of the samples (respectively) with intermediate grade DCIS. (Of note: the 10 o'clock IDC located 8 cmfn was noted to display papillary features, while the 10 o'clock IDC located 7 cmfn was noted display mucinous features). Prognostic indicators from the 10 o'clock mass located 8 cmfn  significant for: estrogen receptor, 95% positive with strong staining intensity and progesterone  receptor, 0% negative; Proliferation marker Ki67 at 5%; HER2 negative. Prognostic indicators for the 10 o'clock mass located 7 cmfn significant for: estrogen receptor 100% positive with strong staining intensity; progesterone  receptor 20% positive with moderate staining intensity; Proliferation marker Ki67 at 10%; HER2 status negative. The 12 o'clock right breast mass/cyst was also biopsied at that time and showed no evidence of malignancy, with findings favoring fibrocystic changes and apocrine metaplasia.   Menarche: *** years old Age at first live birth: *** years old GP: *** LMP: *** Contraceptive: *** HRT: ***   The patient was referred today for presentation in the multidisciplinary conference.  Radiology studies and pathology slides were presented there for review and discussion of treatment options.  A consensus was discussed regarding potential next steps.  PREVIOUS RADIATION THERAPY: No  PAST MEDICAL HISTORY:  Past Medical History:  Diagnosis Date   KNEE PAIN 06/26/2010   Qualifier: Diagnosis of  By: Alvan MD, Catherine     Right calcaneal fracture    SHOULDER PAIN, RIGHT 07/09/2010   Qualifier: Diagnosis of  By: Alvan MD, Dorothyann KNIGHT SURGICAL HISTORY: Past Surgical History:  Procedure Laterality Date   BREAST BIOPSY Right 01/13/2024   US  RT BREAST BX W LOC DEV 1ST LESION IMG BX SPEC US  GUIDE 01/13/2024 [facility]MO[facility]   BREAST BIOPSY Right 01/13/2024   US  RT BREAST BX W LOC DEV EA ADD LESION IMG BX SPEC US  GUIDE 01/13/2024 [facility]   BREAST BIOPSY Right 01/13/2024   US  RT BREAST BX W LOC DEV EA ADD LESION IMG BX  SPEC US  GUIDE 01/13/2024 [facility]   COLONOSCOPY     ORIF CALCANEOUS FRACTURE Right 07/27/2020   Procedure: OPEN REDUCTION INTERNAL FIXATION (ORIF) CALCANEOUS FRACTURE;  Surgeon: Kit Rush, MD;  Location:  [facility];   Service: Orthopedics;  Laterality: Right;     FAMILY HISTORY:  Family History  Problem Relation Age of Onset   Heart disease Father 17       quadruple bypass   Hypertension Mother 85   Heart disease Maternal Grandfather    Breast cancer Paternal Grandmother    Heart disease Paternal Grandfather     SOCIAL HISTORY:  Social History   Socioeconomic History   Marital status: Married    Spouse name: Kieren Ricci   Number of children: 2   Years of education: 12   Highest education level: Some college, no degree  Occupational History   Occupation: Horticulturist, commercial: [employer]   Occupation: HR Consultant    Comment: Full time  Tobacco Use   Smoking status: Never   Smokeless tobacco: Never  Vaping Use   Vaping status: Never Used  Substance and Sexual Activity   Alcohol use: Yes    Alcohol/week: 3.0 standard drinks of alcohol    Types: 3 Standard drinks or equivalent per week    Comment: social   Drug use: No   Sexual activity: Yes    Partners: Male    Birth control/protection: Post-menopausal  Other Topics Concern   Not on file  Social History Narrative   Lives with her spouse. She has two children. She enjoys being outdoors and going to the beach.   Social Drivers of Corporate investment banker Strain: Low Risk  (11/27/2023)   Overall Financial Resource Strain (CARDIA)    Difficulty of Paying Living Expenses: Not hard at all  Food Insecurity: No Food Insecurity (11/27/2023)   Hunger Vital Sign    Worried About Running Out of Food in the Last Year: Never true    Ran Out of Food in the Last Year: Never true  Transportation Needs: No Transportation Needs (11/27/2023)   PRAPARE - Administrator, Civil Service (Medical): No    Lack of Transportation (Non-Medical): No  Physical Activity: Sufficiently Active (11/27/2023)   Exercise Vital Sign    Days of Exercise per Week: 3 days    Minutes of Exercise per Session: 60 min  Stress: No  Stress Concern Present (11/27/2023)   [employer] of Occupational Health - Occupational Stress Questionnaire    Feeling of Stress: Not at all  Social Connections: Socially Integrated (11/27/2023)   Social Connection and Isolation Panel    Frequency of Communication with Friends and Family: More than three times a week    Frequency of Social Gatherings with Friends and Family: More than three times a week    Attends Religious Services: 1 to 4 times per year    Active Member of [employer] or Organizations: Yes    Attends Engineer, structural: More than 4 times per year    Marital Status: Married    ALLERGIES:  Allergies  Allergen Reactions   Oxycodone  Nausea Only   Betadine [Povidone Iodine]    Codeine    Hydrocodone     Other Reaction(s): GI UPSET   Metronidazole      Vaginal burning/yeast infection   Tramadol  Nausea And Vomiting   Latex Itching and Rash   Meloxicam  Itching and Rash    MEDICATIONS:  Current Outpatient Medications  Medication Sig Dispense Refill   ibuprofen  (ADVIL ) 800 MG tablet Take 1 tablet (800 mg total) by mouth every 8 (eight) hours as needed. 90 tablet 0   No current facility-administered medications for this encounter.    REVIEW OF SYSTEMS: A 10+ POINT REVIEW OF SYSTEMS WAS OBTAINED including neurology, dermatology, psychiatry, cardiac, respiratory, lymph, extremities, GI, GU, musculoskeletal, constitutional, reproductive, HEENT. On the provided form, she reports ***. She denies *** and any other symptoms.    PHYSICAL EXAM:  vitals were not taken for this visit.  {may need to copy over vitals} Lungs are clear to auscultation bilaterally. Heart has regular rate and rhythm. No palpable cervical, supraclavicular, or axillary adenopathy. Abdomen soft, non-tender, normal bowel sounds. Breast: Left breast with no palpable mass, nipple discharge, or bleeding. Right breast with ***.   KPS = ***  100 - Normal; no complaints; no evidence of disease. 90    - Able to carry on normal activity; minor signs or symptoms of disease. 80   - Normal activity with effort; some signs or symptoms of disease. 36   - Cares for self; unable to carry on normal activity or to do active work. 60   - Requires occasional assistance, but is able to care for most of his personal needs. 50   - Requires considerable assistance and frequent medical care. 40   - Disabled; requires special care and assistance. 30   - Severely disabled; hospital admission is indicated although death not imminent. 20   - Very sick; hospital admission necessary; active supportive treatment necessary. 10   - Moribund; fatal processes progressing rapidly. 0     - Dead  Karnofsky DA, Abelmann WH, Craver LS and Burchenal [facility] [phone]) The use of the nitrogen mustards in the palliative treatment of carcinoma: with particular reference to bronchogenic carcinoma Cancer 1 634-56  LABORATORY DATA:  Lab Results  Component Value Date   WBC 6.0 03/11/2023   HGB 12.2 03/11/2023   HCT 37.3 03/11/2023   MCV 96 03/11/2023   PLT 268 03/11/2023   Lab Results  Component Value Date   NA 140 03/11/2023   K 4.2 03/11/2023   CL 106 03/11/2023   CO2 22 03/11/2023   Lab Results  Component Value Date   ALT 21 03/11/2023   AST 13 03/11/2023   ALKPHOS 60 03/11/2023   BILITOT 0.4 03/11/2023    PULMONARY FUNCTION TEST:   Review Flowsheet        No data to display          RADIOGRAPHY: US  RT BREAST BX W LOC DEV 1ST LESION IMG BX SPEC US  GUIDE Addendum Date: 01/15/2024 ADDENDUM REPORT: 01/15/2024 14:35 ADDENDUM: Pathology revealed GRADE II INVASIVE DUCTAL CARCINOMA WITH PAPILLARY FEATURES, DUCTAL CARCINOMA IN SITU, INTERMEDIATE GRADE, CALCIFICATIONS: NOT IDENTIFIED, OTHER FINDINGS: NONE of the RIGHT breast, 10 o'clock position, 8 cm from the nipple, (heart clip). This was found to be concordant by Dr. Curtistine Noble. Pathology revealed GRADE II INVASIVE DUCTAL CARCINOMA WITH MUCINOUS FEATURES, DUCTAL  CARCINOMA IN SITU, INTERMEDIATE GRADE, CALCIFICATIONS: NOT IDENTIFIED, OTHER FINDINGS: NONE of the RIGHT breast, 10 o'clock position, 7 cm from the nipple, (ribbon clip). This was found to be concordant by Dr. Curtistine Noble. Pathology revealed FIBROCYSTIC CHANGE AND APOCRINE METAPLASIA of the RIGHT breast, 12 o'clock position, 5 cm from the nipple, (coil clip). This was found to be concordant by Dr. Curtistine Noble. Pathology results were discussed with the patient by telephone. The patient  reported doing well after the biopsies with tenderness, itching and slight bruising at the sites. Post biopsy instructions and care were reviewed and questions were answered. The patient was encouraged to call [facility] for any additional concerns. My direct phone number was provided. The patient was referred to [facility] on January 21, 2024. Recommendation for a bilateral breast MRI given family history, multiple findings and breast density-C. Pathology results reported by Hendricks Benders, RN on 01/15/2024. Electronically Signed   By: Curtistine Noble   On: 01/15/2024 14:35   Result Date: 01/15/2024 CLINICAL DATA:  68 year old female here for a 3 site right breast biopsy EXAM: ULTRASOUND GUIDED RIGHT BREAST CORE NEEDLE BIOPSY COMPARISON:  Previous exam(s). PROCEDURE: I met with the patient and we discussed the procedure of ultrasound-guided biopsy, including benefits and alternatives. We discussed the high likelihood of a successful procedure. We discussed the risks of the procedure, including infection, bleeding, tissue injury, clip migration, and inadequate sampling. Informed written consent was given. The usual time-out protocol was performed immediately prior to the procedure. Site 1: Right breast, 10 o'clock position, 8 cm from the nipple Using sterile technique and 1% Lidocaine  as local anesthetic, under direct  ultrasound visualization, a 14 gauge spring-loaded device was used to perform biopsy of a 6 mm hypoechoic mass using a lateral approach. At the conclusion of the procedure a heart shaped tissue marker clip was deployed into the biopsy cavity. Site 2: Right breast, 10 o'clock position, 7 cm from the nipple Using sterile technique and 1% Lidocaine  as local anesthetic, under direct ultrasound visualization, a 14 gauge spring-loaded device was used to perform biopsy of a 12 mm hypoechoic mass using a lateral approach. At the conclusion of the procedure a ribbon shaped tissue marker clip was deployed into the biopsy cavity. Site 3: Right breast, 12 o'clock position, 5 cm from the nipple Using sterile technique and 1% Lidocaine  as local anesthetic, under direct ultrasound visualization, a 14 gauge spring-loaded device was used to perform biopsy of a 6 mm anechoic to hypoechoic mass using a lateral approach. At the conclusion of the procedure the coil shaped tissue marker clip was deployed into the biopsy cavity. Follow up 2 view mammogram was performed and dictated separately. IMPRESSION: Three site ultrasound guided biopsy of the right breast. No apparent complications. Electronically Signed: By: Curtistine Noble On: 01/13/2024 14:34   US  RT BREAST BX W LOC DEV EA ADD LESION IMG BX SPEC US  GUIDE Addendum Date: 01/15/2024 ADDENDUM REPORT: 01/15/2024 14:35 ADDENDUM: Pathology revealed GRADE II INVASIVE DUCTAL CARCINOMA WITH PAPILLARY FEATURES, DUCTAL CARCINOMA IN SITU, INTERMEDIATE GRADE, CALCIFICATIONS: NOT IDENTIFIED, OTHER FINDINGS: NONE of the RIGHT breast, 10 o'clock position, 8 cm from the nipple, (heart clip). This was found to be concordant by Dr. Curtistine Noble. Pathology revealed GRADE II INVASIVE DUCTAL CARCINOMA WITH MUCINOUS FEATURES, DUCTAL CARCINOMA IN SITU, INTERMEDIATE GRADE, CALCIFICATIONS: NOT IDENTIFIED, OTHER FINDINGS: NONE of the RIGHT breast, 10 o'clock position, 7 cm from the nipple, (ribbon  clip). This was found to be concordant by Dr. Curtistine Noble. Pathology revealed FIBROCYSTIC CHANGE AND APOCRINE METAPLASIA of the RIGHT breast, 12 o'clock position, 5 cm from the nipple, (coil clip). This was found to be concordant by Dr. Curtistine Noble. Pathology results were discussed with the patient by telephone. The patient reported doing well after the biopsies with tenderness, itching and slight bruising at the sites. Post biopsy instructions and care were  reviewed and questions were answered. The patient was encouraged to call [facility] for any additional concerns. My direct phone number was provided. The patient was referred to [facility] on January 21, 2024. Recommendation for a bilateral breast MRI given family history, multiple findings and breast density-C. Pathology results reported by Hendricks Benders, RN on 01/15/2024. Electronically Signed   By: Curtistine Noble   On: 01/15/2024 14:35   Result Date: 01/15/2024 CLINICAL DATA:  68 year old female here for a 3 site right breast biopsy EXAM: ULTRASOUND GUIDED RIGHT BREAST CORE NEEDLE BIOPSY COMPARISON:  Previous exam(s). PROCEDURE: I met with the patient and we discussed the procedure of ultrasound-guided biopsy, including benefits and alternatives. We discussed the high likelihood of a successful procedure. We discussed the risks of the procedure, including infection, bleeding, tissue injury, clip migration, and inadequate sampling. Informed written consent was given. The usual time-out protocol was performed immediately prior to the procedure. Site 1: Right breast, 10 o'clock position, 8 cm from the nipple Using sterile technique and 1% Lidocaine  as local anesthetic, under direct ultrasound visualization, a 14 gauge spring-loaded device was used to perform biopsy of a 6 mm hypoechoic mass using a lateral approach. At the conclusion of the  procedure a heart shaped tissue marker clip was deployed into the biopsy cavity. Site 2: Right breast, 10 o'clock position, 7 cm from the nipple Using sterile technique and 1% Lidocaine  as local anesthetic, under direct ultrasound visualization, a 14 gauge spring-loaded device was used to perform biopsy of a 12 mm hypoechoic mass using a lateral approach. At the conclusion of the procedure a ribbon shaped tissue marker clip was deployed into the biopsy cavity. Site 3: Right breast, 12 o'clock position, 5 cm from the nipple Using sterile technique and 1% Lidocaine  as local anesthetic, under direct ultrasound visualization, a 14 gauge spring-loaded device was used to perform biopsy of a 6 mm anechoic to hypoechoic mass using a lateral approach. At the conclusion of the procedure the coil shaped tissue marker clip was deployed into the biopsy cavity. Follow up 2 view mammogram was performed and dictated separately. IMPRESSION: Three site ultrasound guided biopsy of the right breast. No apparent complications. Electronically Signed: By: Curtistine Noble On: 01/13/2024 14:34   US  RT BREAST BX W LOC DEV EA ADD LESION IMG BX SPEC US  GUIDE Addendum Date: 01/15/2024 ADDENDUM REPORT: 01/15/2024 14:35 ADDENDUM: Pathology revealed GRADE II INVASIVE DUCTAL CARCINOMA WITH PAPILLARY FEATURES, DUCTAL CARCINOMA IN SITU, INTERMEDIATE GRADE, CALCIFICATIONS: NOT IDENTIFIED, OTHER FINDINGS: NONE of the RIGHT breast, 10 o'clock position, 8 cm from the nipple, (heart clip). This was found to be concordant by Dr. Curtistine Noble. Pathology revealed GRADE II INVASIVE DUCTAL CARCINOMA WITH MUCINOUS FEATURES, DUCTAL CARCINOMA IN SITU, INTERMEDIATE GRADE, CALCIFICATIONS: NOT IDENTIFIED, OTHER FINDINGS: NONE of the RIGHT breast, 10 o'clock position, 7 cm from the nipple, (ribbon clip). This was found to be concordant by Dr. Curtistine Noble. Pathology revealed FIBROCYSTIC CHANGE AND APOCRINE METAPLASIA of the RIGHT breast, 12 o'clock  position, 5 cm from the nipple, (coil clip). This was found to be concordant by Dr. Curtistine Noble. Pathology results were discussed with the patient by telephone. The patient reported doing well after the biopsies with tenderness, itching and slight bruising at the sites. Post biopsy instructions and care were reviewed and questions were answered. The patient was encouraged to call [facility] for any additional concerns.  My direct phone number was provided. The patient was referred to [facility] on January 21, 2024. Recommendation for a bilateral breast MRI given family history, multiple findings and breast density-C. Pathology results reported by Hendricks Benders, RN on 01/15/2024. Electronically Signed   By: Curtistine Noble   On: 01/15/2024 14:35   Result Date: 01/15/2024 CLINICAL DATA:  68 year old female here for a 3 site right breast biopsy EXAM: ULTRASOUND GUIDED RIGHT BREAST CORE NEEDLE BIOPSY COMPARISON:  Previous exam(s). PROCEDURE: I met with the patient and we discussed the procedure of ultrasound-guided biopsy, including benefits and alternatives. We discussed the high likelihood of a successful procedure. We discussed the risks of the procedure, including infection, bleeding, tissue injury, clip migration, and inadequate sampling. Informed written consent was given. The usual time-out protocol was performed immediately prior to the procedure. Site 1: Right breast, 10 o'clock position, 8 cm from the nipple Using sterile technique and 1% Lidocaine  as local anesthetic, under direct ultrasound visualization, a 14 gauge spring-loaded device was used to perform biopsy of a 6 mm hypoechoic mass using a lateral approach. At the conclusion of the procedure a heart shaped tissue marker clip was deployed into the biopsy cavity. Site 2: Right breast, 10 o'clock position, 7 cm from the nipple Using sterile  technique and 1% Lidocaine  as local anesthetic, under direct ultrasound visualization, a 14 gauge spring-loaded device was used to perform biopsy of a 12 mm hypoechoic mass using a lateral approach. At the conclusion of the procedure a ribbon shaped tissue marker clip was deployed into the biopsy cavity. Site 3: Right breast, 12 o'clock position, 5 cm from the nipple Using sterile technique and 1% Lidocaine  as local anesthetic, under direct ultrasound visualization, a 14 gauge spring-loaded device was used to perform biopsy of a 6 mm anechoic to hypoechoic mass using a lateral approach. At the conclusion of the procedure the coil shaped tissue marker clip was deployed into the biopsy cavity. Follow up 2 view mammogram was performed and dictated separately. IMPRESSION: Three site ultrasound guided biopsy of the right breast. No apparent complications. Electronically Signed: By: Curtistine Noble On: 01/13/2024 14:34   MM CLIP PLACEMENT RIGHT Result Date: 01/13/2024 CLINICAL DATA:  68 year old female status post 3 site right breast biopsy. EXAM: 3D DIAGNOSTIC RIGHT MAMMOGRAM POST ULTRASOUND BIOPSY COMPARISON:  Previous exam(s). ACR Breast Density Category c: The breasts are heterogeneously dense, which may obscure small masses. FINDINGS: 3D Mammographic images were obtained following ultrasound guided biopsy of the right breast. The biopsy marking clips are in their expected position at the site of biopsy. IMPRESSION: Appropriate positioning of the biopsy marking clips at the site of biopsy in the right upper outer breast. Final Assessment: Post Procedure Mammograms for Marker Placement Electronically Signed   By: Curtistine Noble   On: 01/13/2024 14:38   MM 3D DIAGNOSTIC MAMMOGRAM UNILATERAL RIGHT BREAST Result Date: 01/06/2024 CLINICAL DATA:  RIGHT breast callback EXAM: DIGITAL DIAGNOSTIC UNILATERAL RIGHT MAMMOGRAM WITH TOMOSYNTHESIS AND CAD; ULTRASOUND RIGHT BREAST LIMITED TECHNIQUE: Right digital diagnostic  mammography and breast tomosynthesis was performed. The images were evaluated with computer-aided detection. ; Targeted ultrasound examination of the right breast was performed COMPARISON:  Previous exam(s). ACR Breast Density Category c: The breasts are heterogeneously dense, which may obscure small masses. FINDINGS: Spot compression tomosynthesis views demonstrates a persistent superficial mass in the RIGHT upper outer breast at middle to posterior depth. This is not definitively stable compared to  priors. On physical exam, no suspicious mass is appreciated. Targeted ultrasound was performed of the RIGHT upper outer breast. At 10 o'clock 8 cm from the nipple, there is an irregular hypoechoic mass with irregular margins. This measures 6 x 4 x 6 mm. This is favored to correspond to the site of mammographic concern. At 10 o'clock 7 cm from nipple, there is an oval hypoechoic mass with irregular margins. It measures 9 x 5 x 12 mm. This is incidentally sonographically identified but likely corresponds to an asymmetry on spot MLO slice 39 At 12 o'clock 5 cm from the nipple, there is a round mass which is near anechoic in appearance with suggestion of posterior acoustic enhancement. It measures 5 by 6 x 4 mm. This is incidentally sonographically identified. Targeted ultrasound was performed of the RIGHT axilla. No suspicious axillary lymph nodes are visualized. IMPRESSION: 1. There is an indeterminate 6 mm mass at the site of screening mammographic concern. Recommend ultrasound-guided biopsy for definitive characterization. 2. There is a 12 mm sonographically identified mass in the RIGHT upper outer breast. Recommend ultrasound-guided biopsy for definitive characterization. 3. There is an additional sonographically identified 6 mm round mass which may reflect a complicated cyst at 12 o'clock. Recommend ultrasound-guided aspiration with potential conversion to biopsy for definitive characterization. 4. No suspicious RIGHT  axillary adenopathy. RECOMMENDATION: 1. RIGHT breast ultrasound-guided biopsy x2 2. RIGHT breast ultrasound-guided aspiration with potential conversion to biopsy x1 I have discussed the findings and recommendations with the patient. The biopsy procedure was discussed with the patient and questions were answered. Patient expressed their understanding of the biopsy recommendation. Patient will be scheduled for biopsy at her earliest convenience by the schedulers. Ordering provider will be notified. If applicable, a reminder letter will be sent to the patient regarding the next appointment. BI-RADS CATEGORY  4: Suspicious. Electronically Signed   By: Corean Salter M.D.   On: 01/06/2024 09:57   US  LIMITED ULTRASOUND INCLUDING AXILLA RIGHT BREAST Result Date: 01/06/2024 CLINICAL DATA:  RIGHT breast callback EXAM: DIGITAL DIAGNOSTIC UNILATERAL RIGHT MAMMOGRAM WITH TOMOSYNTHESIS AND CAD; ULTRASOUND RIGHT BREAST LIMITED TECHNIQUE: Right digital diagnostic mammography and breast tomosynthesis was performed. The images were evaluated with computer-aided detection. ; Targeted ultrasound examination of the right breast was performed COMPARISON:  Previous exam(s). ACR Breast Density Category c: The breasts are heterogeneously dense, which may obscure small masses. FINDINGS: Spot compression tomosynthesis views demonstrates a persistent superficial mass in the RIGHT upper outer breast at middle to posterior depth. This is not definitively stable compared to priors. On physical exam, no suspicious mass is appreciated. Targeted ultrasound was performed of the RIGHT upper outer breast. At 10 o'clock 8 cm from the nipple, there is an irregular hypoechoic mass with irregular margins. This measures 6 x 4 x 6 mm. This is favored to correspond to the site of mammographic concern. At 10 o'clock 7 cm from nipple, there is an oval hypoechoic mass with irregular margins. It measures 9 x 5 x 12 mm. This is incidentally sonographically  identified but likely corresponds to an asymmetry on spot MLO slice 39 At 12 o'clock 5 cm from the nipple, there is a round mass which is near anechoic in appearance with suggestion of posterior acoustic enhancement. It measures 5 by 6 x 4 mm. This is incidentally sonographically identified. Targeted ultrasound was performed of the RIGHT axilla. No suspicious axillary lymph nodes are visualized. IMPRESSION: 1. There is an indeterminate 6 mm mass at the site of screening mammographic concern.  Recommend ultrasound-guided biopsy for definitive characterization. 2. There is a 12 mm sonographically identified mass in the RIGHT upper outer breast. Recommend ultrasound-guided biopsy for definitive characterization. 3. There is an additional sonographically identified 6 mm round mass which may reflect a complicated cyst at 12 o'clock. Recommend ultrasound-guided aspiration with potential conversion to biopsy for definitive characterization. 4. No suspicious RIGHT axillary adenopathy. RECOMMENDATION: 1. RIGHT breast ultrasound-guided biopsy x2 2. RIGHT breast ultrasound-guided aspiration with potential conversion to biopsy x1 I have discussed the findings and recommendations with the patient. The biopsy procedure was discussed with the patient and questions were answered. Patient expressed their understanding of the biopsy recommendation. Patient will be scheduled for biopsy at her earliest convenience by the schedulers. Ordering provider will be notified. If applicable, a reminder letter will be sent to the patient regarding the next appointment. BI-RADS CATEGORY  4: Suspicious. Electronically Signed   By: Corean Salter M.D.   On: 01/06/2024 09:57   US  LIMITED JOINT SPACE STRUCTURES LOW LEFT Result Date: 01/02/2024 Ultrasound utilized for guided injection into left knee      IMPRESSION: ***   Patient will be a good candidate for breast conservation with radiotherapy to the right breast. We discussed the general  course of radiation, potential side effects, and toxicities with radiation and the patient is interested in this approach. ***   PLAN:  ***    ------------------------------------------------  Lynwood CHARM Nasuti, PhD, MD  This document serves as a record of services personally performed by Lynwood Nasuti, MD. It was created on his behalf by Dorthy Fuse, a trained medical scribe. The creation of this record is based on the scribe's personal observations and the provider's statements to them. This document has been checked and approved by the attending provider.

## 2024-01-21 ENCOUNTER — Ambulatory Visit: Admitting: Physical Therapy

## 2024-01-21 ENCOUNTER — Inpatient Hospital Stay (HOSPITAL_BASED_OUTPATIENT_CLINIC_OR_DEPARTMENT_OTHER): Admitting: Hematology and Oncology

## 2024-01-21 ENCOUNTER — Encounter: Payer: Self-pay | Admitting: *Deleted

## 2024-01-21 ENCOUNTER — Other Ambulatory Visit: Payer: Self-pay | Admitting: *Deleted

## 2024-01-21 ENCOUNTER — Inpatient Hospital Stay: Attending: Hematology and Oncology

## 2024-01-21 ENCOUNTER — Ambulatory Visit

## 2024-01-21 ENCOUNTER — Inpatient Hospital Stay: Admitting: Licensed Clinical Social Worker

## 2024-01-21 ENCOUNTER — Ambulatory Visit
Admission: RE | Admit: 2024-01-21 | Discharge: 2024-01-21 | Disposition: A | Discharge status: Home / Self Care | Source: Ambulatory Visit | Attending: Radiation Oncology | Admitting: Radiation Oncology

## 2024-01-21 VITALS — BP 136/75 | HR 69 | Temp 97.4°F | Resp 18 | Ht 65.0 in | Wt 195.8 lb

## 2024-01-21 DIAGNOSIS — Z170 Estrogen receptor positive status [ER+]: Secondary | ICD-10-CM

## 2024-01-21 DIAGNOSIS — Z803 Family history of malignant neoplasm of breast: Secondary | ICD-10-CM | POA: Diagnosis not present

## 2024-01-21 DIAGNOSIS — C50411 Malignant neoplasm of upper-outer quadrant of right female breast: Secondary | ICD-10-CM

## 2024-01-21 DIAGNOSIS — C50811 Malignant neoplasm of overlapping sites of right female breast: Secondary | ICD-10-CM

## 2024-01-21 LAB — CBC WITH DIFFERENTIAL (CANCER CENTER ONLY)
Abs Immature Granulocytes: 0.02 K/uL (ref 0.00–0.07)
Basophils Absolute: 0.1 K/uL (ref 0.0–0.1)
Basophils Relative: 1 %
Eosinophils Absolute: 0.2 K/uL (ref 0.0–0.5)
Eosinophils Relative: 3 %
HCT: 37.1 % (ref 36.0–46.0)
Hemoglobin: 12.6 g/dL (ref 12.0–15.0)
Immature Granulocytes: 0 %
Lymphocytes Relative: 26 %
Lymphs Abs: 1.9 K/uL (ref 0.7–4.0)
MCH: 31 pg (ref 26.0–34.0)
MCHC: 34 g/dL (ref 30.0–36.0)
MCV: 91.2 fL (ref 80.0–100.0)
Monocytes Absolute: 0.8 K/uL (ref 0.1–1.0)
Monocytes Relative: 11 %
Neutro Abs: 4.3 K/uL (ref 1.7–7.7)
Neutrophils Relative %: 59 %
Platelet Count: 254 K/uL (ref 150–400)
RBC: 4.07 MIL/uL (ref 3.87–5.11)
RDW: 12.9 % (ref 11.5–15.5)
WBC Count: 7.3 K/uL (ref 4.0–10.5)
nRBC: 0 % (ref 0.0–0.2)

## 2024-01-21 LAB — CMP (CANCER CENTER ONLY)
ALT: 19 U/L (ref 0–44)
AST: 16 U/L (ref 15–41)
Albumin: 4.5 g/dL (ref 3.5–5.0)
Alkaline Phosphatase: 52 U/L (ref 38–126)
Anion gap: 6 (ref 5–15)
BUN: 25 mg/dL — ABNORMAL HIGH (ref 8–23)
CO2: 26 mmol/L (ref 22–32)
Calcium: 9.2 mg/dL (ref 8.9–10.3)
Chloride: 106 mmol/L (ref 98–111)
Creatinine: 1.19 mg/dL — ABNORMAL HIGH (ref 0.44–1.00)
GFR, Estimated: 49 mL/min — ABNORMAL LOW (ref >60)
Glucose, Bld: 99 mg/dL (ref 70–99)
Potassium: 4.2 mmol/L (ref 3.5–5.1)
Sodium: 138 mmol/L (ref 135–145)
Total Bilirubin: 0.5 mg/dL (ref 0.0–1.2)
Total Protein: 7.1 g/dL (ref 6.5–8.1)

## 2024-01-21 LAB — GENETIC SCREENING ORDER

## 2024-01-21 NOTE — Progress Notes (Signed)
 REFERRING PROVIDER: [name], MD [address] [place],  [place] [address]  PRIMARY PROVIDER:  [name], MD  PRIMARY REASON FOR VISIT:  No diagnosis found.  HISTORY OF PRESENT ILLNESS:   Ms. Mcclish, a 68 y.o. female, was seen for a [place] cancer genetics consultation at the request of Dr. Aron due to a personal history of breast cancer. Ms. Wexler presents today the at [facility] to discuss the possibility of a hereditary predisposition to cancer, genetic testing, and to further clarify her future cancer risks, as well as potential cancer risks for family members.  Diagnosis: In September of 2025, at the age of 42, Ms. 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 There are two primary sights. One is ER/PR positive, HER2- the second is ER+,PR- and HER2-.  CANCER HISTORY:  Oncology History  Malignant neoplasm of upper-outer quadrant of right breast in female, estrogen receptor positive (HCC)  01/19/2024 Initial Diagnosis   Malignant neoplasm of upper-outer quadrant of right breast in female, estrogen receptor positive (HCC)   Malignant neoplasm of overlapping sites of right breast in female, estrogen receptor positive (HCC)  01/19/2024 Initial Diagnosis   Malignant neoplasm of overlapping sites of right breast in female, estrogen receptor positive (HCC)    RISK FACTORS:  Menarche was at age 58.  First live birth at age 45.  OCP use for approximately 0 years. (Never used) Ovaries intact: yes. NO Hysterectomy: no. NO Menopausal status: postmenopausal. HRT use: 0 years.(Never used) Colonoscopy: yes; normal. Plans to follow-up with PCP about colonoscopy referral Mammogram within the last year: yes. Number of breast biopsies: 1. Tobacco Use: Never  Past Medical History:  Diagnosis Date   KNEE PAIN 06/26/2010   Qualifier: Diagnosis of  By: Alvan MD,  Catherine     Right calcaneal fracture    SHOULDER PAIN, RIGHT 07/09/2010   Qualifier: Diagnosis of  By: Alvan MD, Dorothyann     Past Surgical History:  Procedure Laterality Date   BREAST BIOPSY Right 01/13/2024   US  RT BREAST BX W LOC DEV 1ST LESION IMG BX SPEC US  GUIDE 01/13/2024 [facility]   BREAST BIOPSY Right 01/13/2024   US  RT BREAST BX W LOC DEV EA ADD LESION IMG BX SPEC US  GUIDE 01/13/2024 [facility]   BREAST BIOPSY Right 01/13/2024   US  RT BREAST BX W LOC DEV EA ADD LESION IMG BX SPEC US  GUIDE 01/13/2024 [facility]   COLONOSCOPY     ORIF CALCANEOUS FRACTURE Right 07/27/2020   Procedure: OPEN REDUCTION INTERNAL FIXATION (ORIF) CALCANEOUS FRACTURE;  Surgeon: Kit Rush, MD;  Location: [facility];  Service: Orthopedics;  Laterality: Right;    Social History   Socioeconomic History   Marital status: Married    Spouse name: Ronica Vivian   Number of children: 2   Years of education: 12   Highest education level: Some college, no degree  Occupational History   Occupation: Horticulturist, commercial: [employer]   Occupation: HR Consultant    Comment: Full time  Tobacco Use   Smoking status: Never   Smokeless tobacco: Never  Vaping Use   Vaping status: Never Used  Substance and Sexual Activity   Alcohol use: Yes    Alcohol/week: 3.0 standard drinks of alcohol    Types: 3 Standard drinks or equivalent per week    Comment: social   Drug use: No  Sexual activity: Yes    Partners: Male    Birth control/protection: Post-menopausal  Other Topics Concern   Not on file  Social History Narrative   Lives with her spouse. She has two children. She enjoys being outdoors and going to the beach.   Social Drivers of Corporate investment banker Strain: Low Risk  (11/27/2023)   Overall Financial Resource Strain (CARDIA)    Difficulty of Paying Living Expenses: Not hard at all  Food Insecurity: No Food Insecurity (11/27/2023)    Hunger Vital Sign    Worried About Running Out of Food in the Last Year: Never true    Ran Out of Food in the Last Year: Never true  Transportation Needs: No Transportation Needs (11/27/2023)   PRAPARE - Administrator, Civil Service (Medical): No    Lack of Transportation (Non-Medical): No  Physical Activity: Sufficiently Active (11/27/2023)   Exercise Vital Sign    Days of Exercise per Week: 3 days    Minutes of Exercise per Session: 60 min  Stress: No Stress Concern Present (11/27/2023)   [employer] of Occupational Health - Occupational Stress Questionnaire    Feeling of Stress: Not at all  Social Connections: Socially Integrated (11/27/2023)   Social Connection and Isolation Panel    Frequency of Communication with Friends and Family: More than three times a week    Frequency of Social Gatherings with Friends and Family: More than three times a week    Attends Religious Services: 1 to 4 times per year    Active Member of [employer] or Organizations: Yes    Attends Engineer, structural: More than 4 times per year    Marital Status: Married    FAMILY HISTORY:  We obtained a detailed, 4-generation family history.  Significant diagnoses are listed below: Family History  Problem Relation Age of Onset   Hypertension Mother 53   Arthritis Mother    Heart disease Mother    Heart disease Father 18       quadruple bypass   Skin cancer Father    Heart disease Maternal Grandfather    Breast cancer Paternal Grandmother 4 - 56   Heart disease Paternal Grandfather      Family History Summary: The patient has a family history of breast cancer in her paternal 1st cousin and paternal great grandfather. There is also a family history of skin cancer, but no reported history of melanoma.   Ms. Firkus reported that her 27 year-old daughter completed caner genetic testing and received negative results. A copy of the report was not available and it was unclear genes may  have been included on her daughters testing. There no reported Ashkenazi Jewish ancestry. There is no known consanguinity.  GENETIC COUNSELING ASSESSMENT: Ms. Rames is a 68 y.o. female with a personal and family history of breast which is somewhat suggestive of a hereditary cancer predisposition syndrome given her personal diagnosis of two primary breast cancers and three individuals in the family with a diagnosis of breast cancer on one side of the family (including Ms. Oneita). We, therefore, discussed and recommended the following at today's visit.   DISCUSSION: We discussed that, in general, most cancer is not inherited in families, but instead is sporadic or familial. Sporadic cancers occur by chance and typically happen at older ages (>50 years) as this type of cancer is caused by genetic changes acquired during an individual's lifetime. Some families have more cancers than would be expected by  chance; however, the ages or types of cancer are not consistent with a known genetic mutation or known genetic mutations have been ruled out. This type of familial cancer is thought to be due to a combination of multiple genetic, environmental, hormonal, and lifestyle factors. While this combination of factors likely increases the risk of cancer, the exact source of this risk is not currently identifiable or testable.  We discussed that 5 - 10% of cancer is the result of germline (heritable) genetic variants, with most cases associated with BRCA1/BRCA2. There are other genes that can be associated with hereditary cancer syndromes. We discussed that testing is beneficial for several reasons including knowing how to follow individuals after completing their treatment, identifying whether potential treatment options such as PARP inhibitors would be beneficial, and understanding if other family members could be at risk for cancer and allow them to undergo genetic testing.   We reviewed the characteristics,  features and inheritance patterns of hereditary cancer syndromes. We also discussed genetic testing, including the appropriate family members to test, the process of testing, insurance coverage and turn-around-time for results. We discussed the implications of a negative, positive, carrier and/or variant of uncertain significant result. Ms. Culley  was offered a common hereditary cancer panel (36+ genes) and an expanded pan-cancer panel (70+ genes). Ms. Kalisz was informed of the benefits and limitations of each panel, including that expanded pan-cancer panels contain genes that do not have clear management guidelines at this point in time.  We also discussed that as the number of genes included on a panel increases, the chances of variants of uncertain significance increases. Ms. Burmaster decided to pursue genetic testing for the [facility] CancerNext+RNAinsight Panel panel.  The [facility] CancerNext+RNAinsight Panel includes sequencing, rearrangement analysis, and RNA analysis for the following 40 genes:  APC, ATM, BAP1, BARD1, BMPR1A, BRCA1, BRCA2, BRIP1, CDH1, CDKN2A, CHEK2, FH, FLCN, MET, MLH1, MSH2, MSH6, MUTYH, NF1, NTHL1, PALB2, PMS2, PTEN, RAD51C, RAD51D, RPS20, SMAD4, STK11, TP53, TSC1, TSC2 and VHL (sequencing and deletion/duplication); AXIN2, HOXB13, MBD4, MSH3, POLD1 and POLE (sequencing only); EPCAM and GREM1 (deletion/duplication only). RNA data is routinely analyzed for use in variant interpretation for all genes.   Genetic Testing Consent:  After considering the risks, benefits, and limitations, Ms. Sieler did provided informed consent to pursue genetic testing.  A blood sample was sent to [facility] for analysis of the CancerNext+RNA Panel. Results should be available within approximately 2-3 weeks' time, at which point they will be disclosed by telephone to Ms. Debenedetto , as will any additional recommendations warranted by these results. Ms. Mcconnell will receive a summary of her genetic  counseling visit and a copy of her results once available. This information will also be available in Epic.   Genetic Information Nondiscrimination Act (GINA): We discussed that some people do not want to undergo genetic testing due to fear of genetic discrimination.  The Genetic Information Nondiscrimination Act (GINA) was signed into federal law in 2008. GINA prohibits health insurers and most employers from discriminating against individuals based on genetic information (including the results of genetic tests and family history information). According to GINA, health insurance companies cannot consider genetic information to be a preexisting condition, nor can they use it to make decisions regarding coverage or rates. GINA also makes it illegal for most employers to use genetic information in making decisions about hiring, firing, promotion, or terms of employment. It is important to note that GINA does not offer protections for life insurance, disability insurance, or  long-term care insurance. GINA does not apply to those in the [employer], those who work for companies with less than 15 employees, and new life insurance or long-term disability insurance policies.  Health status due to a cancer diagnosis is not protected under GINA. More information about GINA can be found by visiting [email or web address].  Cost & Coverage: Based on Ms. Milbourne's personal and family history of cancer, she meets medical criteria for genetic testing, based her personal diagnosis of two primary breast cancers and three individuals in the family with a diagnosis of breast cancer on one side of the family (including Ms. Oneita). Despite that she meets criteria, she may still have an out of pocket cost.    Ms. Vizcarrondo questions were answered to her satisfaction today. Our contact information was provided should additional questions or concerns arise. Thank you for the referral and allowing us  to share in the care of your patient.    Resources:  Ms. Falwell was provided with the following:  [facility] Contact information Brochure summarizing information reviewed today Plan: Test ordered: [facility] CancerNext+RNAinsight Panel   Santana Fryer, MS, [facility]  Certified Genetic Counselor  Email: [email or web address]  Phone: [phone]   In total, 30 minutes were spent on the date of the encounter in service to the patient including preparation, face-to-face consultation, documentation and care coordination. The patient brought her eldest daughter and husband. Drs. Lanny Stalls, and/or Gudena were available for questions, if needed. _______________________________________________________________________ For Office Staff:  Number of people involved in session: 3 Was an Intern/ student involved with case: no

## 2024-01-21 NOTE — Progress Notes (Signed)
 [facility] Clinical Social Work  Initial Assessment   Kristine Bowman is a 68 y.o. year old female accompanied by husband, Kristine Bowman, and daughter Kristine Bowman. Clinical Social Work was referred by [facility] for assessment of psychosocial needs.   SDOH (Social Determinants of Health) assessments performed: Yes SDOH Interventions    Flowsheet Row Clinical Support from 01/21/2024 in [facility] A Dept Of [place]  SDOH Interventions   Food Insecurity Interventions Intervention Not Indicated  Housing Interventions Intervention Not Indicated  Transportation Interventions Intervention Not Indicated  Utilities Interventions Intervention Not Indicated    SDOH Screenings   Food Insecurity: No Food Insecurity (01/21/2024)  Housing: Low Risk  (01/21/2024)  Transportation Needs: No Transportation Needs (01/21/2024)  Utilities: Not At Risk (01/21/2024)  Alcohol Screen: Low Risk  (07/15/2023)  Depression (PHQ2-9): Low Risk  (01/21/2024)  Financial Resource Strain: Low Risk  (11/27/2023)  Physical Activity: Sufficiently Active (11/27/2023)  Social Connections: Socially Integrated (11/27/2023)  Stress: No Stress Concern Present (11/27/2023)  Tobacco Use: Low Risk  (01/21/2024)    PHQ 2/9:    01/21/2024    3:55 PM 11/27/2023   11:12 AM 08/26/2022   10:45 AM  Depression screen PHQ 2/9  Decreased Interest 0 0 0  Down, Depressed, Hopeless 0 0 0  PHQ - 2 Score 0 0 0     Distress Screen completed: No     No data to display            Family/Social Information:  Housing Arrangement: patient lives with her husband. Daughter Kristine Bowman lives in [place] and daughter Kristine Bowman lives in [place] Family members/support persons in your life? Family and Friends Transportation concerns: no  Employment: Facilities manager.  Income source: Employment and Actor concerns: No Type of concern: None Food access concerns: no Religious or spiritual practice: Not  known Advanced directives: No Services Currently in place:  [employer]  Coping/ Adjustment to diagnosis: Patient understands treatment plan and what happens next? yes Patient reported stressors: Adjusting to my illness Patient enjoys being outside, exercise, and time with family/ friends Current coping skills/ strengths: Ability for insight , Capable of independent living , Communication skills , and Supportive family/friends     SUMMARY: Current SDOH Barriers:  No major barriers identified today  Clinical Social Work Clinical Goal(s):  No clinical social work goals at this time  Interventions: Discussed common feeling and emotions when being diagnosed with cancer, and the importance of support during treatment Informed patient of the support team roles and support services at [facility] Provided CSW contact information and encouraged patient to call with any questions or concerns   Follow Up Plan: Patient will contact CSW with any support or resource needs Patient verbalizes understanding of plan: Yes    Kristine Kneebone E Trudee Chirino, LCSW Clinical Social Worker [facility]

## 2024-01-21 NOTE — Progress Notes (Signed)
 [facility] CONSULT NOTE  Patient Care Team: [name], MD as PCP - General (Family Medicine) Gerome, Devere HERO, RN as Oncology Nurse Navigator Tyree Nanetta SAILOR, RN as Oncology Nurse Navigator Aron Shoulders, MD as Consulting Physician (General Surgery) [name], MD as Consulting Physician (Hematology and Oncology) Shannon Agent, MD as Consulting Physician (Radiation Oncology)  CHIEF COMPLAINTS/PURPOSE OF CONSULTATION:  New diagnosis of breast cancer  ASSESSMENT & PLAN:   Assessment and Plan Assessment & Plan Estrogen receptor positive stage 1 breast cancer, multifocal. This is a very pleasant 68 year old postmenopausal female patient with multifocal right breast invasive ductal carcinoma, 1 area being ER/PR positive, area being ER positive and PR negative with variable Ki-67 score of 5 to 10% referred to breast [facility] for additional recommendations.  She arrived to appointment today with her husband as well as her daughter.  At baseline she is very healthy, takes ibuprofen  as needed otherwise does not take any medications on a regular basis.  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  She has a paternal grandmother with breast cancer, otherwise no family history of breast cancer.  No personal use of HRT.  On physical exam, postbiopsy changes noted, no obvious palpable mass.    Given small tumor, we have discussed about proceeding with upfront surgery followed by Oncotype DX testing. We have discussed about Oncotype Dx score which is a well validated prognostic scoring system which can predict outcome with endocrine therapy alone and whether chemotherapy reduces recurrence.  Typically in patients with ER positive cancers that are node negative if the RS score is high typically greater than or equal to 26, chemotherapy is recommended.  In women with intermediate recurrence score younger than 50, there can still be some role for chemotherapy  in addition to endocrine therapy especially if the recurrence score is between 21-25. If chemotherapy is needed, this will precede radiation and then after radiation she will continue on antiestrogen therapy.  We have also discussed the following options for antiestrogen therapy. With regards to Tamoxifen, we discussed that this is a SERM, selective estrogen receptor modulator. We discussed mechanism of action of Tamoxifen, adverse effects on Tamoxifen including but not limited to post menopausal symptoms, increased risk of DVT/PE, increased risk of endometrial cancer, questionable cataracts with long term use and increased risk of cardiovascular events in the study which was not statistically significant. A benefit from Tamoxifen would be improvement in bone density. With regards to aromatase inhibitors, we discussed mechanism of action, adverse effects including but not limited to post menopausal symptoms, arthralgias, myalgias, increased risk of cardiovascular events and bone loss.   At this time she is averse to the idea of taking a pill for the next 5 years.  She will proceed with surgery and Oncotype DX testing and return to clinic for a follow-up.  Thank you for consulting us  in the care of this patient.  Please do not hesitate to contact us  with any additional questions   HISTORY OF PRESENTING ILLNESS:  Kelia Gibbon 68 y.o. female is here because of new diagnosis of breast cancer  Oncology History  Malignant neoplasm of upper-outer quadrant of right breast in female, estrogen receptor positive (HCC)  01/06/2024 Mammogram   IMPRESSION: 1. There is an indeterminate 6 mm mass at the site of screening mammographic concern. Recommend ultrasound-guided biopsy for definitive characterization. 2. There is a 12 mm sonographically identified mass in the RIGHT upper outer breast. Recommend ultrasound-guided  biopsy for definitive characterization. 3. There is an additional sonographically  identified 6 mm round mass which may reflect a complicated cyst at 12 o'clock. Recommend ultrasound-guided aspiration with potential conversion to biopsy for definitive characterization. 4. No suspicious RIGHT axillary adenopathy.   RECOMMENDATION: 1. RIGHT breast ultrasound-guided biopsy x2 2. RIGHT breast ultrasound-guided aspiration with potential conversion to biopsy x1   01/19/2024 Initial Diagnosis   Malignant neoplasm of upper-outer quadrant of right breast in female, estrogen receptor positive (HCC)   01/21/2024 Cancer Staging   Staging form: Breast, AJCC 8th Edition - Clinical stage from 01/21/2024: Stage IA (cT1c, cN0, cM0, G2, ER+, PR-, HER2-) - Signed by [name], MD on 01/21/2024 Stage prefix: Initial diagnosis Histologic grading system: 3 grade system Laterality: Right Staged by: Pathologist and managing physician Stage used in treatment planning: Yes National guidelines used in treatment planning: Yes Type of national guideline used in treatment planning: NCCN Staging comments: This was the 8cmfn     Pathology Results    1. Breast, right, needle core biopsy, 10 o'clock 8cmfn (heart clip) :       - INVASIVE DUCTAL CARCINOMA WITH PAPILLARY FEATURES, SEE NOTE       - DUCTAL CARCINOMA IN SITU, INTERMEDIATE GRADE       - TUBULE FORMATION: SCORE 3       - NUCLEAR PLEOMORPHISM: SCORE 2       - MITOTIC COUNT: SCORE 1       - TOTAL SCORE: 6       - OVERALL GRADE: 2       - LYMPHOVASCULAR INVASION: NOT IDENTIFIED       - CANCER LENGTH: 0.5 CM       - CALCIFICATIONS: NOT IDENTIFIED       - OTHER FINDINGS: NONE   2. Breast, right, needle core biopsy, 10 o'clock 7cmfn (ribbon clip) :       - INVASIVE DUCTAL CARCINOMA WITH MUCINOUS FEATURES, SEE NOTE       - DUCTAL CARCINOMA IN SITU, INTERMEDIATE GRADE       - TUBULE FORMATION: SCORE 3       - NUCLEAR PLEOMORPHISM: SCORE 2       - MITOTIC COUNT: SCORE 1       - TOTAL SCORE: 6       - OVERALL GRADE: 2       -  LYMPHOVASCULAR INVASION: NOT IDENTIFIED       - CANCER LENGTH: 0.4 CM       - CALCIFICATIONS: NOT IDENTIFIED       - OTHER FINDINGS: NONE   3. Breast, right, needle core biopsy, 12 o'clock 5cmfn (coil clip) :       - FIBROCYSTIC CHANGE AND APOCRINE METAPLASIA       - NO MALIGNANCY IDENTIFIED   ADDENDUM  1A) Breast, right, needle core biopsy, 10 o'clock 8cmfn (heart clip)  PROGNOSTIC INDICATORS   Results:  IMMUNOHISTOCHEMICAL AND MORPHOMETRIC ANALYSIS PERFORMED MANUALLY  The tumor cells are NEGATIVE for Her2 (0).  Estrogen Receptor:  95%, POSITIVE, STRONG STAINING INTENSITY  Progesterone  Receptor:  0%, NEGATIVE  Proliferation Marker Ki67:  5%    IMMUNOHISTOCHEMICAL AND MORPHOMETRIC ANALYSIS PERFORMED MANUALLY The tumor cells are NEGATIVE for Her2 (0). Estrogen Receptor:  100%, POSITIVE, STRONG STAINING INTENSITY Progesterone  Receptor:  20%, POSITIVE, MODERATE STAINING INTENSITY Proliferation Marker Ki67:  10%    Malignant neoplasm of overlapping sites of right breast in female, estrogen receptor positive (HCC)  01/19/2024 Initial Diagnosis  Malignant neoplasm of overlapping sites of right breast in female, estrogen receptor positive (HCC)     Discussed the use of AI scribe software for clinical note transcription with the patient, who gave verbal consent to proceed.  History of Present Illness Treesa Mccully is a 68 year old female with breast cancer who presents for a consultation regarding her treatment options. She is accompanied by her daughter, Megan. She has been diagnosed with breast cancer, with two areas of concern identified. Largest tumor measured 12 mm. The tumor exhibits mixed features in terms of aggressiveness, with estrogen receptor positivity and PR positivity and one area being progesterone  receptor negative.  She has not been on any medications regularly and prefers to avoid them, expressing a belief in the body's resilience to heal itself. She is considering her  options regarding the use of anti-estrogen medications such as anastrozole or tamoxifen.  Her family history includes her father's mother having had cancer in her eighties, but no other significant family history of cancer was noted. She has not undergone genetic testing but is considering it, as her daughter [name] inquired about the potential benefits of such testing for both of them.  She maintains a healthy lifestyle, focusing on nutrition, physical activity, and sleep.   All other systems were reviewed with the patient and are negative.  MEDICAL HISTORY:  Past Medical History:  Diagnosis Date   Arthritis 2022   Breast cancer [facility])    KNEE PAIN 06/26/2010   Qualifier: Diagnosis of  By: Alvan MD, Catherine     Right calcaneal fracture    SHOULDER PAIN, RIGHT 07/09/2010   Qualifier: Diagnosis of  By: Alvan MD, Catherine     Skin cancer 08/2018   Had plastic surgery on side of my nose    SURGICAL HISTORY: Past Surgical History:  Procedure Laterality Date   BREAST BIOPSY Right 01/13/2024   US  RT BREAST BX W LOC DEV 1ST LESION IMG BX SPEC US  GUIDE 01/13/2024 [facility]AMMOGR[facility]   BREAST BIOPSY Right 01/13/2024   US  RT BREAST BX W LOC DEV EA ADD LESION IMG BX SPEC US  GUIDE 01/13/2024 [facility]MOGR[facility]   BREAST BIOPSY Right 01/13/2024   US  RT BREAST BX W LOC DEV EA ADD LESION IMG BX SPEC US  GUIDE 01/13/2024 [facility]AMMO[facility]   COLONOSCOPY     FRACTURE SURGERY  3/22   Broke heal   ORIF CALCANEOUS FRACTURE Right 07/27/2020   Procedure: OPEN REDUCTION INTERNAL FIXATION (ORIF) CALCANEOUS FRACTURE;  Surgeon: Kit Rush, MD;  Location: [facility];  Service: Orthopedics;  Laterality: Right;     SOCIAL HISTORY: Social History   Socioeconomic History   Marital status: Married    Spouse name: Malvika Tung   Number of children: 2   Years of education: 12   Highest education level: Some college, no degree  Occupational History   Occupation: Psychologist, educational: [employer]   Occupation: HR Consultant    Comment: Full time  Tobacco Use   Smoking status: Never   Smokeless tobacco: Never  Vaping Use   Vaping status: Never Used  Substance and Sexual Activity   Alcohol use: Yes    Alcohol/week: 3.0 standard drinks of alcohol    Types: 3 Standard drinks or equivalent per week    Comment: social   Drug use: No   Sexual activity: Yes    Partners: Male    Birth control/protection: Post-menopausal  Other Topics Concern   Not  on file  Social History Narrative   Lives with her spouse. She has two children. She enjoys being outdoors and going to the beach.   Social Drivers of Corporate investment banker Strain: Low Risk  (11/27/2023)   Overall Financial Resource Strain (CARDIA)    Difficulty of Paying Living Expenses: Not hard at all  Food Insecurity: No Food Insecurity (11/27/2023)   Hunger Vital Sign    Worried About Running Out of Food in the Last Year: Never true    Ran Out of Food in the Last Year: Never true  Transportation Needs: No Transportation Needs (11/27/2023)   PRAPARE - Administrator, Civil Service (Medical): No    Lack of Transportation (Non-Medical): No  Physical Activity: Sufficiently Active (11/27/2023)   Exercise Vital Sign    Days of Exercise per Week: 3 days    Minutes of Exercise per Session: 60 min  Stress: No Stress Concern Present (11/27/2023)   [employer] of Occupational Health - Occupational Stress Questionnaire    Feeling of Stress: Not at all  Social Connections: Socially Integrated (11/27/2023)   Social Connection and Isolation Panel    Frequency of Communication with Friends and Family: More than three times a week    Frequency of Social Gatherings with Friends and Family: More than three times a week    Attends Religious Services: 1 to 4 times per year    Active Member of [employer] or Organizations: Yes    Attends Engineer, structural: More than 4 times per year     Marital Status: Married  Catering manager Violence: Not At Risk (06/20/2022)   Humiliation, Afraid, Rape, and Kick questionnaire    Fear of Current or Ex-Partner: No    Emotionally Abused: No    Physically Abused: No    Sexually Abused: No    FAMILY HISTORY: Family History  Problem Relation Age of Onset   Hypertension Mother 79   Arthritis Mother    Heart disease Mother    Heart disease Father 52       quadruple bypass   Skin cancer Father    Heart disease Maternal Grandfather    Breast cancer Paternal Grandmother 15 - 44   Heart disease Paternal Grandfather     ALLERGIES:  is allergic to oxycodone , betadine [povidone iodine], codeine, hydrocodone, metronidazole , tramadol , latex, and meloxicam .  MEDICATIONS:  Current Outpatient Medications  Medication Sig Dispense Refill   ibuprofen  (ADVIL ) 800 MG tablet Take 1 tablet (800 mg total) by mouth every 8 (eight) hours as needed. 90 tablet 0   No current facility-administered medications for this visit.     PHYSICAL EXAMINATION: ECOG PERFORMANCE STATUS: 0 - Asymptomatic  Vitals:   01/21/24 0905  BP: 136/75  Pulse: 69  Resp: 18  Temp: (!) 97.4 F (36.3 C)  SpO2: 100%   Filed Weights   01/21/24 0905  Weight: 195 lb 12.8 oz (88.8 kg)    GENERAL:alert, no distress and comfortable Right breast with post biopsy changes. No palpable masses. No regional adenopathy. CTA bilaterally RRR No LE edema.  LABORATORY DATA:  I have reviewed the data as listed Lab Results  Component Value Date   WBC 7.3 01/21/2024   HGB 12.6 01/21/2024   HCT 37.1 01/21/2024   MCV 91.2 01/21/2024   PLT 254 01/21/2024     Chemistry      Component Value Date/Time   NA 138 01/21/2024 1147   NA 140 03/11/2023 0846  K 4.2 01/21/2024 1147   CL 106 01/21/2024 1147   CO2 26 01/21/2024 1147   BUN 25 (H) 01/21/2024 1147   BUN 16 03/11/2023 0846   CREATININE 1.19 (H) 01/21/2024 1147   CREATININE 1.14 (H) 04/17/2021 0000      Component  Value Date/Time   CALCIUM 9.2 01/21/2024 1147   ALKPHOS 52 01/21/2024 1147   AST 16 01/21/2024 1147   ALT 19 01/21/2024 1147   BILITOT 0.5 01/21/2024 1147       RADIOGRAPHIC STUDIES: I have personally reviewed the radiological images as listed and agreed with the findings in the report. US  RT BREAST BX W LOC DEV 1ST LESION IMG BX SPEC US  GUIDE Addendum Date: 01/15/2024 ADDENDUM REPORT: 01/15/2024 14:35 ADDENDUM: Pathology revealed GRADE II INVASIVE DUCTAL CARCINOMA WITH PAPILLARY FEATURES, DUCTAL CARCINOMA IN SITU, INTERMEDIATE GRADE, CALCIFICATIONS: NOT IDENTIFIED, OTHER FINDINGS: NONE of the RIGHT breast, 10 o'clock position, 8 cm from the nipple, (heart clip). This was found to be concordant by Dr. Curtistine Noble. Pathology revealed GRADE II INVASIVE DUCTAL CARCINOMA WITH MUCINOUS FEATURES, DUCTAL CARCINOMA IN SITU, INTERMEDIATE GRADE, CALCIFICATIONS: NOT IDENTIFIED, OTHER FINDINGS: NONE of the RIGHT breast, 10 o'clock position, 7 cm from the nipple, (ribbon clip). This was found to be concordant by Dr. Curtistine Noble. Pathology revealed FIBROCYSTIC CHANGE AND APOCRINE METAPLASIA of the RIGHT breast, 12 o'clock position, 5 cm from the nipple, (coil clip). This was found to be concordant by Dr. Curtistine Noble. Pathology results were discussed with the patient by telephone. The patient reported doing well after the biopsies with tenderness, itching and slight bruising at the sites. Post biopsy instructions and care were reviewed and questions were answered. The patient was encouraged to call [facility] for any additional concerns. My direct phone number was provided. The patient was referred to [facility] on January 21, 2024. Recommendation for a bilateral breast MRI given family history, multiple findings and breast density-C. Pathology results reported by Hendricks Benders, RN on 01/15/2024.  Electronically Signed   By: Curtistine Noble   On: 01/15/2024 14:35   Result Date: 01/15/2024 CLINICAL DATA:  68 year old female here for a 3 site right breast biopsy EXAM: ULTRASOUND GUIDED RIGHT BREAST CORE NEEDLE BIOPSY COMPARISON:  Previous exam(s). PROCEDURE: I met with the patient and we discussed the procedure of ultrasound-guided biopsy, including benefits and alternatives. We discussed the high likelihood of a successful procedure. We discussed the risks of the procedure, including infection, bleeding, tissue injury, clip migration, and inadequate sampling. Informed written consent was given. The usual time-out protocol was performed immediately prior to the procedure. Site 1: Right breast, 10 o'clock position, 8 cm from the nipple Using sterile technique and 1% Lidocaine  as local anesthetic, under direct ultrasound visualization, a 14 gauge spring-loaded device was used to perform biopsy of a 6 mm hypoechoic mass using a lateral approach. At the conclusion of the procedure a heart shaped tissue marker clip was deployed into the biopsy cavity. Site 2: Right breast, 10 o'clock position, 7 cm from the nipple Using sterile technique and 1% Lidocaine  as local anesthetic, under direct ultrasound visualization, a 14 gauge spring-loaded device was used to perform biopsy of a 12 mm hypoechoic mass using a lateral approach. At the conclusion of the procedure a ribbon shaped tissue marker clip was deployed into the biopsy cavity. Site 3: Right breast, 12 o'clock position, 5 cm from the nipple Using sterile technique  and 1% Lidocaine  as local anesthetic, under direct ultrasound visualization, a 14 gauge spring-loaded device was used to perform biopsy of a 6 mm anechoic to hypoechoic mass using a lateral approach. At the conclusion of the procedure the coil shaped tissue marker clip was deployed into the biopsy cavity. Follow up 2 view mammogram was performed and dictated separately. IMPRESSION: Three site ultrasound  guided biopsy of the right breast. No apparent complications. Electronically Signed: By: Curtistine Noble On: 01/13/2024 14:34   US  RT BREAST BX W LOC DEV EA ADD LESION IMG BX SPEC US  GUIDE Addendum Date: 01/15/2024 ADDENDUM REPORT: 01/15/2024 14:35 ADDENDUM: Pathology revealed GRADE II INVASIVE DUCTAL CARCINOMA WITH PAPILLARY FEATURES, DUCTAL CARCINOMA IN SITU, INTERMEDIATE GRADE, CALCIFICATIONS: NOT IDENTIFIED, OTHER FINDINGS: NONE of the RIGHT breast, 10 o'clock position, 8 cm from the nipple, (heart clip). This was found to be concordant by Dr. Curtistine Noble. Pathology revealed GRADE II INVASIVE DUCTAL CARCINOMA WITH MUCINOUS FEATURES, DUCTAL CARCINOMA IN SITU, INTERMEDIATE GRADE, CALCIFICATIONS: NOT IDENTIFIED, OTHER FINDINGS: NONE of the RIGHT breast, 10 o'clock position, 7 cm from the nipple, (ribbon clip). This was found to be concordant by Dr. Curtistine Noble. Pathology revealed FIBROCYSTIC CHANGE AND APOCRINE METAPLASIA of the RIGHT breast, 12 o'clock position, 5 cm from the nipple, (coil clip). This was found to be concordant by Dr. Curtistine Noble. Pathology results were discussed with the patient by telephone. The patient reported doing well after the biopsies with tenderness, itching and slight bruising at the sites. Post biopsy instructions and care were reviewed and questions were answered. The patient was encouraged to call [facility] for any additional concerns. My direct phone number was provided. The patient was referred to [facility] on January 21, 2024. Recommendation for a bilateral breast MRI given family history, multiple findings and breast density-C. Pathology results reported by Hendricks Benders, RN on 01/15/2024. Electronically Signed   By: Curtistine Noble   On: 01/15/2024 14:35   Result Date: 01/15/2024 CLINICAL DATA:  68 year old female here for a 3 site right breast biopsy  EXAM: ULTRASOUND GUIDED RIGHT BREAST CORE NEEDLE BIOPSY COMPARISON:  Previous exam(s). PROCEDURE: I met with the patient and we discussed the procedure of ultrasound-guided biopsy, including benefits and alternatives. We discussed the high likelihood of a successful procedure. We discussed the risks of the procedure, including infection, bleeding, tissue injury, clip migration, and inadequate sampling. Informed written consent was given. The usual time-out protocol was performed immediately prior to the procedure. Site 1: Right breast, 10 o'clock position, 8 cm from the nipple Using sterile technique and 1% Lidocaine  as local anesthetic, under direct ultrasound visualization, a 14 gauge spring-loaded device was used to perform biopsy of a 6 mm hypoechoic mass using a lateral approach. At the conclusion of the procedure a heart shaped tissue marker clip was deployed into the biopsy cavity. Site 2: Right breast, 10 o'clock position, 7 cm from the nipple Using sterile technique and 1% Lidocaine  as local anesthetic, under direct ultrasound visualization, a 14 gauge spring-loaded device was used to perform biopsy of a 12 mm hypoechoic mass using a lateral approach. At the conclusion of the procedure a ribbon shaped tissue marker clip was deployed into the biopsy cavity. Site 3: Right breast, 12 o'clock position, 5 cm from the nipple Using sterile technique and 1% Lidocaine  as local anesthetic, under direct ultrasound visualization, a 14 gauge spring-loaded device was used to perform biopsy of  a 6 mm anechoic to hypoechoic mass using a lateral approach. At the conclusion of the procedure the coil shaped tissue marker clip was deployed into the biopsy cavity. Follow up 2 view mammogram was performed and dictated separately. IMPRESSION: Three site ultrasound guided biopsy of the right breast. No apparent complications. Electronically Signed: By: Curtistine Noble On: 01/13/2024 14:34   US  RT BREAST BX W LOC DEV EA ADD  LESION IMG BX SPEC US  GUIDE Addendum Date: 01/15/2024 ADDENDUM REPORT: 01/15/2024 14:35 ADDENDUM: Pathology revealed GRADE II INVASIVE DUCTAL CARCINOMA WITH PAPILLARY FEATURES, DUCTAL CARCINOMA IN SITU, INTERMEDIATE GRADE, CALCIFICATIONS: NOT IDENTIFIED, OTHER FINDINGS: NONE of the RIGHT breast, 10 o'clock position, 8 cm from the nipple, (heart clip). This was found to be concordant by Dr. Curtistine Noble. Pathology revealed GRADE II INVASIVE DUCTAL CARCINOMA WITH MUCINOUS FEATURES, DUCTAL CARCINOMA IN SITU, INTERMEDIATE GRADE, CALCIFICATIONS: NOT IDENTIFIED, OTHER FINDINGS: NONE of the RIGHT breast, 10 o'clock position, 7 cm from the nipple, (ribbon clip). This was found to be concordant by Dr. Curtistine Noble. Pathology revealed FIBROCYSTIC CHANGE AND APOCRINE METAPLASIA of the RIGHT breast, 12 o'clock position, 5 cm from the nipple, (coil clip). This was found to be concordant by Dr. Curtistine Noble. Pathology results were discussed with the patient by telephone. The patient reported doing well after the biopsies with tenderness, itching and slight bruising at the sites. Post biopsy instructions and care were reviewed and questions were answered. The patient was encouraged to call [facility] for any additional concerns. My direct phone number was provided. The patient was referred to [facility] on January 21, 2024. Recommendation for a bilateral breast MRI given family history, multiple findings and breast density-C. Pathology results reported by Hendricks Benders, RN on 01/15/2024. Electronically Signed   By: Curtistine Noble   On: 01/15/2024 14:35   Result Date: 01/15/2024 CLINICAL DATA:  68 year old female here for a 3 site right breast biopsy EXAM: ULTRASOUND GUIDED RIGHT BREAST CORE NEEDLE BIOPSY COMPARISON:  Previous exam(s). PROCEDURE: I met with the patient and we discussed the procedure of  ultrasound-guided biopsy, including benefits and alternatives. We discussed the high likelihood of a successful procedure. We discussed the risks of the procedure, including infection, bleeding, tissue injury, clip migration, and inadequate sampling. Informed written consent was given. The usual time-out protocol was performed immediately prior to the procedure. Site 1: Right breast, 10 o'clock position, 8 cm from the nipple Using sterile technique and 1% Lidocaine  as local anesthetic, under direct ultrasound visualization, a 14 gauge spring-loaded device was used to perform biopsy of a 6 mm hypoechoic mass using a lateral approach. At the conclusion of the procedure a heart shaped tissue marker clip was deployed into the biopsy cavity. Site 2: Right breast, 10 o'clock position, 7 cm from the nipple Using sterile technique and 1% Lidocaine  as local anesthetic, under direct ultrasound visualization, a 14 gauge spring-loaded device was used to perform biopsy of a 12 mm hypoechoic mass using a lateral approach. At the conclusion of the procedure a ribbon shaped tissue marker clip was deployed into the biopsy cavity. Site 3: Right breast, 12 o'clock position, 5 cm from the nipple Using sterile technique and 1% Lidocaine  as local anesthetic, under direct ultrasound visualization, a 14 gauge spring-loaded device was used to perform biopsy of a 6 mm anechoic to hypoechoic mass using a lateral approach. At the conclusion of the procedure the coil shaped tissue  marker clip was deployed into the biopsy cavity. Follow up 2 view mammogram was performed and dictated separately. IMPRESSION: Three site ultrasound guided biopsy of the right breast. No apparent complications. Electronically Signed: By: Curtistine Noble On: 01/13/2024 14:34   MM CLIP PLACEMENT RIGHT Result Date: 01/13/2024 CLINICAL DATA:  68 year old female status post 3 site right breast biopsy. EXAM: 3D DIAGNOSTIC RIGHT MAMMOGRAM POST ULTRASOUND BIOPSY  COMPARISON:  Previous exam(s). ACR Breast Density Category c: The breasts are heterogeneously dense, which may obscure small masses. FINDINGS: 3D Mammographic images were obtained following ultrasound guided biopsy of the right breast. The biopsy marking clips are in their expected position at the site of biopsy. IMPRESSION: Appropriate positioning of the biopsy marking clips at the site of biopsy in the right upper outer breast. Final Assessment: Post Procedure Mammograms for Marker Placement Electronically Signed   By: Curtistine Noble   On: 01/13/2024 14:38   MM 3D DIAGNOSTIC MAMMOGRAM UNILATERAL RIGHT BREAST Result Date: 01/06/2024 CLINICAL DATA:  RIGHT breast callback EXAM: DIGITAL DIAGNOSTIC UNILATERAL RIGHT MAMMOGRAM WITH TOMOSYNTHESIS AND CAD; ULTRASOUND RIGHT BREAST LIMITED TECHNIQUE: Right digital diagnostic mammography and breast tomosynthesis was performed. The images were evaluated with computer-aided detection. ; Targeted ultrasound examination of the right breast was performed COMPARISON:  Previous exam(s). ACR Breast Density Category c: The breasts are heterogeneously dense, which may obscure small masses. FINDINGS: Spot compression tomosynthesis views demonstrates a persistent superficial mass in the RIGHT upper outer breast at middle to posterior depth. This is not definitively stable compared to priors. On physical exam, no suspicious mass is appreciated. Targeted ultrasound was performed of the RIGHT upper outer breast. At 10 o'clock 8 cm from the nipple, there is an irregular hypoechoic mass with irregular margins. This measures 6 x 4 x 6 mm. This is favored to correspond to the site of mammographic concern. At 10 o'clock 7 cm from nipple, there is an oval hypoechoic mass with irregular margins. It measures 9 x 5 x 12 mm. This is incidentally sonographically identified but likely corresponds to an asymmetry on spot MLO slice 39 At 12 o'clock 5 cm from the nipple, there is a round mass which is  near anechoic in appearance with suggestion of posterior acoustic enhancement. It measures 5 by 6 x 4 mm. This is incidentally sonographically identified. Targeted ultrasound was performed of the RIGHT axilla. No suspicious axillary lymph nodes are visualized. IMPRESSION: 1. There is an indeterminate 6 mm mass at the site of screening mammographic concern. Recommend ultrasound-guided biopsy for definitive characterization. 2. There is a 12 mm sonographically identified mass in the RIGHT upper outer breast. Recommend ultrasound-guided biopsy for definitive characterization. 3. There is an additional sonographically identified 6 mm round mass which may reflect a complicated cyst at 12 o'clock. Recommend ultrasound-guided aspiration with potential conversion to biopsy for definitive characterization. 4. No suspicious RIGHT axillary adenopathy. RECOMMENDATION: 1. RIGHT breast ultrasound-guided biopsy x2 2. RIGHT breast ultrasound-guided aspiration with potential conversion to biopsy x1 I have discussed the findings and recommendations with the patient. The biopsy procedure was discussed with the patient and questions were answered. Patient expressed their understanding of the biopsy recommendation. Patient will be scheduled for biopsy at her earliest convenience by the schedulers. Ordering provider will be notified. If applicable, a reminder letter will be sent to the patient regarding the next appointment. BI-RADS CATEGORY  4: Suspicious. Electronically Signed   By: Corean Salter M.D.   On: 01/06/2024 09:57   US  LIMITED ULTRASOUND INCLUDING AXILLA RIGHT  BREAST Result Date: 01/06/2024 CLINICAL DATA:  RIGHT breast callback EXAM: DIGITAL DIAGNOSTIC UNILATERAL RIGHT MAMMOGRAM WITH TOMOSYNTHESIS AND CAD; ULTRASOUND RIGHT BREAST LIMITED TECHNIQUE: Right digital diagnostic mammography and breast tomosynthesis was performed. The images were evaluated with computer-aided detection. ; Targeted ultrasound examination of  the right breast was performed COMPARISON:  Previous exam(s). ACR Breast Density Category c: The breasts are heterogeneously dense, which may obscure small masses. FINDINGS: Spot compression tomosynthesis views demonstrates a persistent superficial mass in the RIGHT upper outer breast at middle to posterior depth. This is not definitively stable compared to priors. On physical exam, no suspicious mass is appreciated. Targeted ultrasound was performed of the RIGHT upper outer breast. At 10 o'clock 8 cm from the nipple, there is an irregular hypoechoic mass with irregular margins. This measures 6 x 4 x 6 mm. This is favored to correspond to the site of mammographic concern. At 10 o'clock 7 cm from nipple, there is an oval hypoechoic mass with irregular margins. It measures 9 x 5 x 12 mm. This is incidentally sonographically identified but likely corresponds to an asymmetry on spot MLO slice 39 At 12 o'clock 5 cm from the nipple, there is a round mass which is near anechoic in appearance with suggestion of posterior acoustic enhancement. It measures 5 by 6 x 4 mm. This is incidentally sonographically identified. Targeted ultrasound was performed of the RIGHT axilla. No suspicious axillary lymph nodes are visualized. IMPRESSION: 1. There is an indeterminate 6 mm mass at the site of screening mammographic concern. Recommend ultrasound-guided biopsy for definitive characterization. 2. There is a 12 mm sonographically identified mass in the RIGHT upper outer breast. Recommend ultrasound-guided biopsy for definitive characterization. 3. There is an additional sonographically identified 6 mm round mass which may reflect a complicated cyst at 12 o'clock. Recommend ultrasound-guided aspiration with potential conversion to biopsy for definitive characterization. 4. No suspicious RIGHT axillary adenopathy. RECOMMENDATION: 1. RIGHT breast ultrasound-guided biopsy x2 2. RIGHT breast ultrasound-guided aspiration with potential  conversion to biopsy x1 I have discussed the findings and recommendations with the patient. The biopsy procedure was discussed with the patient and questions were answered. Patient expressed their understanding of the biopsy recommendation. Patient will be scheduled for biopsy at her earliest convenience by the schedulers. Ordering provider will be notified. If applicable, a reminder letter will be sent to the patient regarding the next appointment. BI-RADS CATEGORY  4: Suspicious. Electronically Signed   By: Corean Salter M.D.   On: 01/06/2024 09:57   US  LIMITED JOINT SPACE STRUCTURES LOW LEFT Result Date: 01/02/2024 Ultrasound utilized for guided injection into left knee    All questions were answered. The patient knows to call the clinic with any problems, questions or concerns. I spent 45 minutes in the care of this patient including H and P, review of records, counseling and coordination of care.     Amber Stalls, MD 01/21/2024 1:30 PM

## 2024-01-28 ENCOUNTER — Inpatient Hospital Stay
Admission: RE | Admit: 2024-01-28 | Discharge: 2024-01-28 | Disposition: A | Source: Ambulatory Visit | Attending: General Surgery | Admitting: General Surgery

## 2024-01-28 DIAGNOSIS — Z170 Estrogen receptor positive status [ER+]: Secondary | ICD-10-CM

## 2024-01-28 DIAGNOSIS — C50811 Malignant neoplasm of overlapping sites of right female breast: Secondary | ICD-10-CM

## 2024-01-28 MED ORDER — GADOPICLENOL 0.5 MMOL/ML IV SOLN
9.0000 mL | Freq: Once | INTRAVENOUS | Status: AC | PRN
  Administered 2024-01-28: 9 mL via INTRAVENOUS

## 2024-01-29 ENCOUNTER — Telehealth: Payer: Self-pay | Admitting: *Deleted

## 2024-01-29 ENCOUNTER — Other Ambulatory Visit: Payer: Self-pay | Admitting: General Surgery

## 2024-01-29 DIAGNOSIS — R9389 Abnormal findings on diagnostic imaging of other specified body structures: Secondary | ICD-10-CM

## 2024-01-30 ENCOUNTER — Encounter: Payer: Self-pay | Admitting: *Deleted

## 2024-01-30 ENCOUNTER — Telehealth: Payer: Self-pay | Admitting: *Deleted

## 2024-01-30 ENCOUNTER — Other Ambulatory Visit: Payer: Self-pay | Admitting: *Deleted

## 2024-01-30 ENCOUNTER — Other Ambulatory Visit: Payer: Self-pay | Admitting: General Surgery

## 2024-01-30 ENCOUNTER — Encounter: Payer: Self-pay | Admitting: General Surgery

## 2024-01-30 DIAGNOSIS — C50811 Malignant neoplasm of overlapping sites of right female breast: Secondary | ICD-10-CM

## 2024-01-30 DIAGNOSIS — R9389 Abnormal findings on diagnostic imaging of other specified body structures: Secondary | ICD-10-CM

## 2024-01-30 DIAGNOSIS — C50411 Malignant neoplasm of upper-outer quadrant of right female breast: Secondary | ICD-10-CM

## 2024-01-30 NOTE — Telephone Encounter (Signed)
 [facility] follow up call. Left message for patient to please return call.  Dr. Aron ordered second look u/s scheduled on 10/13. Her office has made patient aware of this. Also Dr. Aron gave verbal order for MRI abd w and w/o contrast due to liver lesion seen on MRI. This has been ordered. Awaiting patient's return call to make her aware of the MRI of abdomen and that she will be called to schedule this.

## 2024-01-30 NOTE — Telephone Encounter (Signed)
 Opened in error

## 2024-01-30 NOTE — Telephone Encounter (Signed)
 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  Explained this is a different imaging than her U/S of her breast.  Patient verbalized understanding.

## 2024-02-02 ENCOUNTER — Ambulatory Visit
Admission: RE | Admit: 2024-02-02 | Discharge: 2024-02-02 | Disposition: A | Discharge status: Home / Self Care | Source: Ambulatory Visit | Attending: General Surgery | Admitting: General Surgery

## 2024-02-02 ENCOUNTER — Ambulatory Visit: Payer: Self-pay | Admitting: General Surgery

## 2024-02-02 ENCOUNTER — Ambulatory Visit (HOSPITAL_COMMUNITY)
Admission: RE | Admit: 2024-02-02 | Discharge: 2024-02-02 | Disposition: A | Discharge status: Home / Self Care | Source: Ambulatory Visit | Attending: General Surgery | Admitting: General Surgery

## 2024-02-02 ENCOUNTER — Ambulatory Visit
Admission: RE | Admit: 2024-02-02 | Discharge: 2024-02-02 | Disposition: A | Source: Ambulatory Visit | Attending: General Surgery | Admitting: General Surgery

## 2024-02-02 ENCOUNTER — Other Ambulatory Visit: Payer: Self-pay | Admitting: General Surgery

## 2024-02-02 DIAGNOSIS — R9389 Abnormal findings on diagnostic imaging of other specified body structures: Secondary | ICD-10-CM

## 2024-02-02 DIAGNOSIS — Z170 Estrogen receptor positive status [ER+]: Secondary | ICD-10-CM | POA: Diagnosis present

## 2024-02-02 DIAGNOSIS — C50411 Malignant neoplasm of upper-outer quadrant of right female breast: Secondary | ICD-10-CM

## 2024-02-02 DIAGNOSIS — C50811 Malignant neoplasm of overlapping sites of right female breast: Secondary | ICD-10-CM | POA: Insufficient documentation

## 2024-02-02 HISTORY — PX: BREAST BIOPSY: SHX20

## 2024-02-02 MED ORDER — GADOBUTROL 1 MMOL/ML IV SOLN
8.0000 mL | Freq: Once | INTRAVENOUS | Status: AC | PRN
  Administered 2024-02-02: 8 mL via INTRAVENOUS

## 2024-02-03 ENCOUNTER — Other Ambulatory Visit: Payer: Self-pay | Admitting: General Surgery

## 2024-02-03 ENCOUNTER — Encounter: Payer: Self-pay | Admitting: *Deleted

## 2024-02-03 DIAGNOSIS — R9389 Abnormal findings on diagnostic imaging of other specified body structures: Secondary | ICD-10-CM

## 2024-02-03 LAB — SURGICAL PATHOLOGY

## 2024-02-09 ENCOUNTER — Other Ambulatory Visit

## 2024-02-09 ENCOUNTER — Ambulatory Visit
Admission: RE | Admit: 2024-02-09 | Discharge: 2024-02-09 | Disposition: A | Discharge status: Home / Self Care | Source: Ambulatory Visit | Attending: General Surgery | Admitting: General Surgery

## 2024-02-09 DIAGNOSIS — R9389 Abnormal findings on diagnostic imaging of other specified body structures: Secondary | ICD-10-CM

## 2024-02-09 MED ORDER — GADOPICLENOL 0.5 MMOL/ML IV SOLN
9.0000 mL | Freq: Once | INTRAVENOUS | Status: AC | PRN
  Administered 2024-02-09: 9 mL via INTRAVENOUS

## 2024-02-11 ENCOUNTER — Encounter: Payer: Self-pay | Admitting: *Deleted

## 2024-02-11 LAB — SURGICAL PATHOLOGY

## 2024-02-12 ENCOUNTER — Other Ambulatory Visit: Payer: Self-pay | Admitting: General Surgery

## 2024-02-12 DIAGNOSIS — N6091 Unspecified benign mammary dysplasia of right breast: Secondary | ICD-10-CM

## 2024-02-13 ENCOUNTER — Telehealth: Payer: Self-pay | Admitting: *Deleted

## 2024-02-13 NOTE — Telephone Encounter (Signed)
 Patient called and left message inquiring about surgical date. Surgery orders entered but no date seen in computer. Message sent to [facility] scheduler to let them know that patient is waiting to hear when scheduled. This navigator called patient back and informed her she will hear from [facility] office. No other questions at this time.

## 2024-02-16 ENCOUNTER — Other Ambulatory Visit: Payer: Self-pay | Admitting: General Surgery

## 2024-02-16 ENCOUNTER — Encounter: Payer: Self-pay | Admitting: *Deleted

## 2024-02-16 DIAGNOSIS — C50411 Malignant neoplasm of upper-outer quadrant of right female breast: Secondary | ICD-10-CM

## 2024-02-19 ENCOUNTER — Other Ambulatory Visit

## 2024-02-19 NOTE — Progress Notes (Signed)
 Surgical Instructions   Your procedure is scheduled on Wednesday, October 29th, 2025. Report to [facility] Main Entrance A at 7:00 A.M., then check in with the Admitting office. Any questions or running late day of surgery: call [phone]  Questions prior to your surgery date: call [phone], Monday-Friday, 8am-4pm. If you experience any cold or flu symptoms such as cough, fever, chills, shortness of breath, etc. between now and your scheduled surgery, please notify us  at the above number.     Remember:  Do not eat after midnight the night before your surgery   You may drink clear liquids until 6:00 the morning of your surgery.   Clear liquids allowed are: Water, Non-Citrus Juices (without pulp), Carbonated Beverages, Clear Tea (no milk, honey, etc.), Black Coffee Only (NO MILK, CREAM OR POWDERED CREAMER of any kind), and Gatorade.    Take these medicines the morning of surgery with A SIP OF WATER: None.   May take these medicines IF NEEDED: None.    One week prior to surgery, STOP taking any Aspirin (unless otherwise instructed by your surgeon) Aleve, Naproxen, Ibuprofen , Motrin , Advil , Goody's, BC's, all herbal medications, fish oil, and non-prescription vitamins.                     Do NOT Smoke (Tobacco/Vaping) for 24 hours prior to your procedure.  If you use a CPAP at night, you may bring your mask/headgear for your overnight stay.   You will be asked to remove any contacts, glasses, piercing's, hearing aid's, dentures/partials prior to surgery. Please bring cases for these items if needed.    Patients discharged the day of surgery will not be allowed to drive home, and someone needs to stay with them for 24 hours.  SURGICAL WAITING ROOM VISITATION Patients may have no more than 2 support people in the waiting area - these visitors may rotate.   Pre-op nurse will coordinate an appropriate time for 1 ADULT support person, who may not rotate, to accompany patient  in pre-op.  Children under the age of 7 must have an adult with them who is not the patient and must remain in the main waiting area with an adult.  If the patient needs to stay at the hospital during part of their recovery, the visitor guidelines for inpatient rooms apply.  Please refer to the [facility] website for the visitor guidelines for any additional information.   If you received a COVID test during your pre-op visit  it is requested that you wear a mask when out in public, stay away from anyone that may not be feeling well and notify your surgeon if you develop symptoms. If you have been in contact with anyone that has tested positive in the last 10 days please notify you surgeon.      Pre-operative CHG Bathing Instructions   You can play a key role in reducing the risk of infection after surgery. Your skin needs to be as free of germs as possible. You can reduce the number of germs on your skin by washing with CHG (chlorhexidine gluconate) soap before surgery. CHG is an antiseptic soap that kills germs and continues to kill germs even after washing.   DO NOT use if you have an allergy to chlorhexidine/CHG or antibacterial soaps. If your skin becomes reddened or irritated, stop using the CHG and notify one of our RNs at [phone].              TAKE A  SHOWER THE NIGHT BEFORE SURGERY   Please keep in mind the following:  DO NOT shave, including legs and underarms, 48 hours prior to surgery.   You may shave your face before/day of surgery.  Place clean sheets on your bed the night before surgery Use a clean washcloth (not used since being washed) for shower. DO NOT sleep with pet's night before surgery.  CHG Shower Instructions:  Wash your face and private area with normal soap. If you choose to wash your hair, wash first with your normal shampoo.  After you use shampoo/soap, rinse your hair and body thoroughly to remove shampoo/soap residue.  Turn the water OFF and apply  half the bottle of CHG soap to a CLEAN washcloth.  Apply CHG soap ONLY FROM YOUR NECK DOWN TO YOUR TOES (washing for 3-5 minutes)  DO NOT use CHG soap on face, private areas, open wounds, or sores.  Pay special attention to the area where your surgery is being performed.  If you are having back surgery, having someone wash your back for you may be helpful. Wait 2 minutes after CHG soap is applied, then you may rinse off the CHG soap.  Pat dry with a clean towel  Put on clean pajamas    Additional instructions for the day of surgery: If you choose, you may shower the morning of surgery with an antibacterial soap.  DO NOT APPLY any lotions, deodorants, cologne, or perfumes.   Do not wear jewelry or makeup Do not wear nail polish, gel polish, artificial nails, or any other type of covering on natural nails (fingers and toes) Do not bring valuables to the hospital. [facility] is not responsible for valuables/personal belongings. Put on clean/comfortable clothes.  Please brush your teeth.  Ask your nurse before applying any prescription medications to the skin.

## 2024-02-20 ENCOUNTER — Encounter (HOSPITAL_COMMUNITY): Payer: Self-pay

## 2024-02-20 ENCOUNTER — Encounter (HOSPITAL_COMMUNITY)
Admission: RE | Admit: 2024-02-20 | Discharge: 2024-02-20 | Disposition: A | Discharge status: Home / Self Care | Source: Ambulatory Visit | Attending: General Surgery | Admitting: General Surgery

## 2024-02-20 ENCOUNTER — Other Ambulatory Visit: Payer: Self-pay

## 2024-02-20 VITALS — BP 143/99 | HR 66 | Temp 97.9°F | Resp 16 | Ht 66.0 in | Wt 194.9 lb

## 2024-02-20 DIAGNOSIS — Z01812 Encounter for preprocedural laboratory examination: Secondary | ICD-10-CM | POA: Diagnosis not present

## 2024-02-20 DIAGNOSIS — Z01818 Encounter for other preprocedural examination: Secondary | ICD-10-CM

## 2024-02-20 LAB — CBC
HCT: 38 % (ref 36.0–46.0)
Hemoglobin: 12.4 g/dL (ref 12.0–15.0)
MCH: 30.5 pg (ref 26.0–34.0)
MCHC: 32.6 g/dL (ref 30.0–36.0)
MCV: 93.4 fL (ref 80.0–100.0)
Platelets: 262 K/uL (ref 150–400)
RBC: 4.07 MIL/uL (ref 3.87–5.11)
RDW: 12.9 % (ref 11.5–15.5)
WBC: 5 K/uL (ref 4.0–10.5)
nRBC: 0 % (ref 0.0–0.2)

## 2024-02-20 NOTE — Progress Notes (Signed)
 PCP - Alvan Dorothyann Aquas, MD  Cardiologist -  Hematology and Oncology - Iruku, Linnette Lane Dice, MD   PPM/ICD - denies Device Orders - n/a Rep Notified - n/a  Chest x-ray - denies EKG - denies Stress Test - denies ECHO - denies Cardiac Cath - denies  Sleep Study - denies CPAP - n/a  DM -denies  Blood Thinner Instructions:denies Aspirin Instructions:n/a  ERAS Protcol - Clear liquids until 6:00 am.   COVID TEST- n/a   Anesthesia review: yes, seed placement  Patient denies shortness of breath, fever, cough and chest pain at PAT appointment   All instructions explained to the patient, with a verbal understanding of the material. Patient agrees to go over the instructions while at home for a better understanding. Patient also instructed to self quarantine after being tested for COVID-19. The opportunity to ask questions was provided. ,

## 2024-02-23 NOTE — H&P (Signed)
 REFERRING PHYSICIAN:  Ted   PROVIDER:  JINA CLAIR NEPHEW, MD   Care Team: Patient Care Team: Alvan Dorothyann Aquas, MD as PCP - General (Family Medicine) Nephew Jina Clair, MD as Consulting Provider (Surgical Oncology) Iruku, Linnette Lane Dice, MD as Referring Physician (Hematology and Oncology) Shannon Lynwood BIRCH, MD as Referring Physician (Radiation Oncology)    MRN: [id number] DOB: [birth year]  Subjective    Chief Complaint: Breast Cancer       History of Present Illness: Kristine Bowman is a 68 y.o. female who is seen today as an office consultation at the request of Dr. Loretha for evaluation of Breast Cancer .     Patient has a new diagnosis of right breast cancer in September 2025.  She presented with a possible mass on screening mammography.  Diagnostic imaging was performed.  At 10:00 she had a 6 mm mass at 8 cm from the nipple, a 12 mm mass at 7 cm from the nipple, and a 6 mm mass at 12:00 5 cm from the nipple.  The 10:00 mass at 8 cm from the nipple was favored to be the site of mammographic concern.  All 3 of these underwent core needle biopsy.  The first 2 were found to be invasive ductal carcinoma and the last 1 was benign and concordant.  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       Family cancer history - paternal grandmother Menarche - 58 Menopause unsure Parity -2   Work - see above.   Diagnostic mammogram/us  :01/06/24 [facility] ACR Breast Density Category c: The breasts are heterogeneously dense, which may obscure small masses.  FINDINGS: Spot compression tomosynthesis views demonstrates a  persistent superficial mass in the RIGHT upper outer breast at middle to posterior depth. This is not definitively stable compared to priors.  On physical exam, no suspicious mass is appreciated.  Targeted ultrasound was performed of the RIGHT upper outer breast. At 10 o'clock 8 cm from the nipple, there is an irregular hypoechoic mass with irregular margins. This measures 6 x 4 x 6 mm. This is favored to correspond to the site of mammographic concern.  At 10 o'clock 7 cm from nipple, there is an oval hypoechoic mass with irregular margins. It measures 9 x 5 x 12 mm. This is incidentally sonographically identified but likely corresponds to an asymmetry on spot MLO slice 39 At 12 o'clock 5 cm from the nipple, there is a round mass which is near anechoic in appearance with suggestion of posterior acoustic enhancement. It measures 5 by 6 x 4 mm. This is incidentally sonographically identified.  Targeted ultrasound was performed of the RIGHT axilla. No suspicious axillary lymph nodes are visualized.  IMPRESSION: 1. There is an indeterminate 6 mm mass at the site of screening mammographic concern. Recommend ultrasound-guided biopsy for definitive characterization. 2. There is a 12 mm sonographically identified mass in the RIGHT upper outer breast. Recommend ultrasound-guided biopsy for definitive characterization. 3. There is an additional sonographically identified 6 mm round mass which may reflect a complicated cyst at 12 o'clock. Recommend ultrasound-guided aspiration with potential  conversion to biopsy for definitive characterization. 4. No suspicious RIGHT axillary adenopathy.  RECOMMENDATION: 1. RIGHT breast ultrasound-guided biopsy x2 2. RIGHT breast ultrasound-guided aspiration with potential conversion to biopsy x1  I have discussed the findings and recommendations with the patient. The biopsy procedure was discussed with the patient and questions were answered. Patient  expressed their understanding of the biopsy recommendation. Patient will be scheduled for biopsy at her earliest convenience by the schedulers. Ordering provider will be notified. If applicable, a reminder letter will be sent to the patient regarding the next appointment.  BI-RADS CATEGORY  4: Suspicious.     Pathology core needle biopsy: 01/13/2024 1. Breast, right, needle core biopsy, 10 o'clock 8cmfn (heart clip) :       - INVASIVE DUCTAL CARCINOMA WITH PAPILLARY FEATURES, SEE NOTE       - DUCTAL CARCINOMA IN SITU, INTERMEDIATE GRADE       - TUBULE FORMATION: SCORE 3       - NUCLEAR PLEOMORPHISM: SCORE 2       - MITOTIC COUNT: SCORE 1       - TOTAL SCORE: 6       - OVERALL GRADE: 2       - LYMPHOVASCULAR INVASION: NOT IDENTIFIED       - CANCER LENGTH: 0.5 CM       - CALCIFICATIONS: NOT IDENTIFIED      - OTHER FINDINGS: NONE   1 The tumor cells are NEGATIVE for Her2 (0).  Estrogen Receptor:  95%, POSITIVE, STRONG STAINING INTENSITY  Progesterone  Receptor:  0%, NEGATIVE  Proliferation Marker Ki67:  5%    2. Breast, right, needle core biopsy, 10 o'clock 7cmfn (ribbon clip) :       - INVASIVE DUCTAL CARCINOMA WITH MUCINOUS FEATURES, SEE NOTE       - DUCTAL CARCINOMA IN SITU, INTERMEDIATE GRADE       - TUBULE FORMATION: SCORE 3       - NUCLEAR PLEOMORPHISM: SCORE 2       - MITOTIC COUNT: SCORE 1       - TOTAL SCORE: 6       - OVERALL GRADE: 2       - LYMPHOVASCULAR INVASION: NOT IDENTIFIED       - CANCER LENGTH: 0.4 CM       - CALCIFICATIONS: NOT IDENTIFIED       - OTHER FINDINGS: NONE  2 IMMUNOHISTOCHEMICAL AND MORPHOMETRIC ANALYSIS PERFORMED MANUALLY The tumor cells are NEGATIVE for Her2 (0). Estrogen Receptor:  100%, POSITIVE, STRONG STAINING INTENSITY Progesterone  Receptor:  20%, POSITIVE, MODERATE STAINING INTENSITY Proliferation Marker Ki67:  10%      Review of Systems: A complete review of systems was obtained from the patient.  I have reviewed this information and  discussed as appropriate with the patient.  See HPI as well for other ROS. ROS -easy bruising and osteoarthritis       Medical History: Past Medical History      Past Medical History:  Diagnosis Date   Arthritis     History of cancer          Problem List     Patient Active Problem List  Diagnosis   Malignant neoplasm of upper-outer quadrant of right breast in female, estrogen receptor positive (CMS/HHS-HCC)   Malignant neoplasm of overlapping sites of right breast in female, estrogen receptor positive (CMS/HHS-HCC)        Past Surgical History       Past Surgical  History:  Procedure Laterality Date   ORIF Right Calcaneous Fracture Right 07/27/2020   .Right Breast Biopsy Right 01/13/2024        Allergies       Allergies  Allergen Reactions   Oxycodone  Nausea   Codeine Other (See Comments) and Nausea      Makes pt very sick   Hydrocodone Other (See Comments)      Other Reaction(s): GI UPSET   Tramadol  Nausea And Vomiting   Latex Itching and Rash   Meloxicam  Itching and Rash   Povidone-Iodine Rash        Medications Ordered Prior to Encounter        Current Outpatient Medications on File Prior to Visit  Medication Sig Dispense Refill   ibuprofen  (MOTRIN ) 800 MG tablet Take 800 mg by mouth every 8 (eight) hours as needed        No current facility-administered medications on file prior to visit.        Family History       Family History  Problem Relation Age of Onset   High blood pressure (Hypertension) Mother     Skin cancer Father     Coronary Artery Disease (Blocked arteries around heart) Father     Deep vein thrombosis (DVT or abnormal blood clot formation) Brother          Tobacco Use History  Social History       Tobacco Use  Smoking Status Never  Smokeless Tobacco Never        Social History  Social History        Socioeconomic History   Marital status: Married  Tobacco Use   Smoking status: Never   Smokeless tobacco:  Never  Vaping Use   Vaping status: Never Used    Social Drivers of Acupuncturist Strain: Low Risk  (11/27/2023)    Received from [facility]    Overall Financial Resource Strain (CARDIA)     How hard is it for you to pay for the very basics like food, housing, medical care, and heating?: Not hard at all  Food Insecurity: No Food Insecurity (11/27/2023)    Received from [facility]    Hunger Vital Sign     Within the past 12 months, you worried that your food would run out before you got the money to buy more.: Never true     Within the past 12 months, the food you bought just didn't last and you didn't have money to get more.: Never true  Transportation Needs: No Transportation Needs (11/27/2023)    Received from [facility] - Transportation     In the past 12 months, has lack of transportation kept you from medical appointments or from getting medications?: No     In the past 12 months, has lack of transportation kept you from meetings, work, or from getting things needed for daily living?: No  Physical Activity: Sufficiently Active (11/27/2023)    Received from [facility]    Exercise Vital Sign     On average, how many days per week do you engage in moderate to strenuous exercise (like a brisk walk)?: 3 days     On average, how many minutes do you engage in exercise at this level?: 60 min  Stress: No Stress Concern Present (11/27/2023)    Received from [facility] - Occupational Stress Questionnaire  Do you feel stress - tense, restless, nervous, or anxious, or unable to sleep at night because your mind is troubled all the time - these days?: Not at all  Social Connections: Socially Integrated (11/27/2023)    Received from [facility]    Social Connection and Isolation Panel     In a typical week, how many times do you talk on the phone with family, friends, or neighbors?: More than three times a week     How  often do you get together with friends or relatives?: More than three times a week     How often do you attend church or religious services?: 1 to 4 times per year     Do you belong to any clubs or organizations such as church groups, unions, fraternal or athletic groups, or school groups?: Yes     How often do you attend meetings of the clubs or organizations you belong to?: More than 4 times per year     Are you married, widowed, divorced, separated, never married, or living with a partner?: Married  Housing Stability: Unknown (01/21/2024)    Housing Stability Vital Sign     Homeless in the Last Year: No        Objective:         Vitals:      BP: 136/75  Pulse: 69  Resp: 18  Temp: 36.3 C (97.4 F)  Weight: 88.8 kg (195 lb 12.8 oz)  Height: 165.1 cm (5' 5)    Body mass index is 32.58 kg/m.     Gen:  No acute distress.  Well nourished and well groomed.   Neurological: Alert and oriented to person, place, and time. Coordination normal.  Head: Normocephalic and atraumatic.  Eyes: Conjunctivae are normal. Pupils are equal, round, and reactive to light. No scleral icterus.  Neck: Normal range of motion. Neck supple. No tracheal deviation or thyromegaly present.  Cardiovascular: Normal rate, regular rhythm, normal heart sounds and intact distal pulses.  Exam reveals no gallop and no friction rub.  No murmur heard. Breast: Relatively symmetric in size.  Significant bruising on the right.  No palpable masses.  No nipple retraction or nipple discharge.  No contour abnormalities.  No lymphadenopathy.  No skin dimpling.  Left breast benign. Respiratory: Effort normal.  No respiratory distress. No chest wall tenderness. Breath sounds normal.  No wheezes, rales or rhonchi.  GI: Soft. Bowel sounds are normal. The abdomen is soft and nontender.  There is no rebound and no guarding.  Musculoskeletal: Normal range of motion. Extremities are nontender.  Lymphadenopathy: No cervical,  preauricular, postauricular or axillary adenopathy is present Skin: Skin is warm and dry. No rash noted. No diaphoresis. No erythema. No pallor. No clubbing, cyanosis, or edema.   Psychiatric: Normal mood and affect. Behavior is normal. Judgment and thought content normal.      Labs None known   Assessment and Plan:        ICD-10-CM    1. Malignant neoplasm of upper-outer quadrant of right breast in female, estrogen receptor positive (CMS/HHS-HCC)  C50.411      Z17.0       2. Malignant neoplasm of overlapping sites of right breast in female, estrogen receptor positive (CMS/HHS-HCC)  C50.811      Z17.0         Patient has 2 small areas of cancer in the upper outer quadrant of the right breast.  The largest is a T1c N0 hormone positive cancer.  Given the favorable prognostic profile, she is a candidate for lumpectomy alone without lymph node biopsy based on the choosing wisely guidelines.  I would plan to do a bracketed lumpectomy given that these are close together.   We will plan to do an MRI given that there were 2 masses that were not seen on mammogram.  I discussed the risk of having to do additional biopsies.  Assuming that we do not find any additional malignancy, the patient will remain a candidate breast conservation.   I discussed the procedure with the patient and her family.  I reviewed the rationale for seed placement.  I discussed complications of breast surgery including bleeding, infection, seroma, possible need for additional procedures, chronic breast pain, chronic dissatisfaction with the appearance, and tension of the breast, dissatisfaction with the appearance of the breast, lymphedema of the breast, cancer recurrence, heart or lung complications, blood clot, or other.   She technically has 2 different cancers given that 1 of these is ER/PR positive and 1 of these is ER positive PR negative.  The larger 1 is ER and PR positive with a higher Ki-67.  Depending on the final  pathology, we may send an Oncotype on 1 of these.  They are both in the same quadrant.  We may find that these are confluent, but if so, it would represent a cancer that has different expressions in the same mass.   I advised that we would be following the patient in the survivorship phase after treatment for at least 5 years.  Orders are written and submitted for surgery.

## 2024-02-24 ENCOUNTER — Ambulatory Visit
Admission: RE | Admit: 2024-02-24 | Discharge: 2024-02-24 | Disposition: A | Discharge status: Home / Self Care | Source: Ambulatory Visit | Attending: General Surgery | Admitting: General Surgery

## 2024-02-24 ENCOUNTER — Ambulatory Visit
Admission: RE | Admit: 2024-02-24 | Discharge: 2024-02-24 | Disposition: A | Source: Ambulatory Visit | Attending: General Surgery | Admitting: General Surgery

## 2024-02-24 DIAGNOSIS — N6091 Unspecified benign mammary dysplasia of right breast: Secondary | ICD-10-CM

## 2024-02-24 DIAGNOSIS — C50411 Malignant neoplasm of upper-outer quadrant of right female breast: Secondary | ICD-10-CM

## 2024-02-24 HISTORY — PX: BREAST BIOPSY: SHX20

## 2024-02-25 ENCOUNTER — Ambulatory Visit
Admission: RE | Admit: 2024-02-25 | Discharge: 2024-02-25 | Disposition: A | Discharge status: Home / Self Care | Source: Ambulatory Visit | Attending: General Surgery | Admitting: General Surgery

## 2024-02-25 ENCOUNTER — Other Ambulatory Visit: Payer: Self-pay

## 2024-02-25 ENCOUNTER — Encounter (HOSPITAL_COMMUNITY): Payer: Self-pay | Admitting: General Surgery

## 2024-02-25 ENCOUNTER — Ambulatory Visit (HOSPITAL_COMMUNITY)
Admission: RE | Admit: 2024-02-25 | Discharge: 2024-02-25 | Disposition: A | Discharge status: Home / Self Care | Attending: General Surgery | Admitting: General Surgery

## 2024-02-25 ENCOUNTER — Ambulatory Visit (HOSPITAL_COMMUNITY): Payer: Self-pay | Admitting: Physician Assistant

## 2024-02-25 ENCOUNTER — Encounter (HOSPITAL_COMMUNITY): Admission: RE | Disposition: A | Payer: Self-pay | Source: Home / Self Care | Attending: General Surgery

## 2024-02-25 ENCOUNTER — Ambulatory Visit
Admission: RE | Admit: 2024-02-25 | Discharge: 2024-02-25 | Disposition: A | Source: Ambulatory Visit | Attending: General Surgery | Admitting: General Surgery

## 2024-02-25 ENCOUNTER — Ambulatory Visit (HOSPITAL_COMMUNITY): Admitting: Registered Nurse

## 2024-02-25 DIAGNOSIS — C50811 Malignant neoplasm of overlapping sites of right female breast: Secondary | ICD-10-CM | POA: Diagnosis not present

## 2024-02-25 DIAGNOSIS — Z170 Estrogen receptor positive status [ER+]: Secondary | ICD-10-CM

## 2024-02-25 DIAGNOSIS — N6091 Unspecified benign mammary dysplasia of right breast: Secondary | ICD-10-CM | POA: Diagnosis not present

## 2024-02-25 DIAGNOSIS — C50911 Malignant neoplasm of unspecified site of right female breast: Secondary | ICD-10-CM

## 2024-02-25 DIAGNOSIS — Z1722 Progesterone receptor negative status: Secondary | ICD-10-CM | POA: Insufficient documentation

## 2024-02-25 DIAGNOSIS — Z1732 Human epidermal growth factor receptor 2 negative status: Secondary | ICD-10-CM | POA: Diagnosis not present

## 2024-02-25 DIAGNOSIS — C50411 Malignant neoplasm of upper-outer quadrant of right female breast: Secondary | ICD-10-CM | POA: Insufficient documentation

## 2024-02-25 DIAGNOSIS — Z1721 Progesterone receptor positive status: Secondary | ICD-10-CM | POA: Insufficient documentation

## 2024-02-25 HISTORY — PX: BREAST LUMPECTOMY WITH RADIOACTIVE SEED LOCALIZATION: SHX6424

## 2024-02-25 HISTORY — PX: RADIOACTIVE SEED GUIDED BREAST BIOPSY: SHX7688

## 2024-02-25 SURGERY — BREAST LUMPECTOMY WITH RADIOACTIVE SEED LOCALIZATION
Anesthesia: General | Site: Breast | Laterality: Right

## 2024-02-25 MED ORDER — LACTATED RINGERS IV SOLN: INTRAVENOUS | Status: DC

## 2024-02-25 MED ORDER — 0.9 % SODIUM CHLORIDE (POUR BTL) OPTIME
TOPICAL | Status: DC | PRN
  Administered 2024-02-25: 1000 mL

## 2024-02-25 MED ORDER — EPHEDRINE SULFATE-NACL 50-0.9 MG/10ML-% IV SOSY
PREFILLED_SYRINGE | INTRAVENOUS | Status: DC | PRN
  Administered 2024-02-25: 5 mg via INTRAVENOUS

## 2024-02-25 MED ORDER — CHLORHEXIDINE GLUCONATE CLOTH 2 % EX PADS: 6.0000 | MEDICATED_PAD | Freq: Once | CUTANEOUS | Status: DC

## 2024-02-25 MED ORDER — PHENYLEPHRINE HCL-NACL 20-0.9 MG/250ML-% IV SOLN
INTRAVENOUS | Status: DC | PRN
  Administered 2024-02-25: 30 ug/min via INTRAVENOUS

## 2024-02-25 MED ORDER — CEFAZOLIN SODIUM-DEXTROSE 2-4 GM/100ML-% IV SOLN
2.0000 g | INTRAVENOUS | Status: AC
  Administered 2024-02-25: 2 g via INTRAVENOUS
  Filled 2024-02-25: qty 100

## 2024-02-25 MED ORDER — FENTANYL CITRATE (PF) 100 MCG/2ML IJ SOLN
INTRAMUSCULAR | Status: AC
  Filled 2024-02-25: qty 2

## 2024-02-25 MED ORDER — PROPOFOL 10 MG/ML IV BOLUS
INTRAVENOUS | Status: DC | PRN
Start: 2024-02-25 — End: 2024-02-25
  Administered 2024-02-25: 140 mg via INTRAVENOUS

## 2024-02-25 MED ORDER — BUPIVACAINE-EPINEPHRINE (PF) 0.25% -1:200000 IJ SOLN
INTRAMUSCULAR | Status: AC
  Filled 2024-02-25: qty 30

## 2024-02-25 MED ORDER — PROPOFOL 10 MG/ML IV BOLUS
INTRAVENOUS | Status: AC
  Filled 2024-02-25: qty 20

## 2024-02-25 MED ORDER — ONDANSETRON HCL 4 MG/2ML IJ SOLN
INTRAMUSCULAR | Status: DC | PRN
  Administered 2024-02-25: 4 mg via INTRAVENOUS

## 2024-02-25 MED ORDER — ORAL CARE MOUTH RINSE: 15.0000 mL | Freq: Once | OROMUCOSAL | Status: AC

## 2024-02-25 MED ORDER — CHLORHEXIDINE GLUCONATE 0.12 % MT SOLN
15.0000 mL | Freq: Once | OROMUCOSAL | Status: AC
  Administered 2024-02-25: 15 mL via OROMUCOSAL
  Filled 2024-02-25: qty 15

## 2024-02-25 MED ORDER — ACETAMINOPHEN 500 MG PO TABS
1000.0000 mg | ORAL_TABLET | ORAL | Status: AC
  Administered 2024-02-25: 1000 mg via ORAL
  Filled 2024-02-25: qty 2

## 2024-02-25 MED ORDER — LIDOCAINE 2% (20 MG/ML) 5 ML SYRINGE
INTRAMUSCULAR | Status: DC | PRN
  Administered 2024-02-25: 60 mg via INTRAVENOUS

## 2024-02-25 MED ORDER — FENTANYL CITRATE (PF) 250 MCG/5ML IJ SOLN
INTRAMUSCULAR | Status: DC | PRN
  Administered 2024-02-25 (×2): 50 ug via INTRAVENOUS
  Administered 2024-02-25: 25 ug via INTRAVENOUS
  Administered 2024-02-25: 50 ug via INTRAVENOUS
  Administered 2024-02-25: 25 ug via INTRAVENOUS

## 2024-02-25 MED ORDER — DROPERIDOL 2.5 MG/ML IJ SOLN
INTRAMUSCULAR | Status: AC
  Filled 2024-02-25: qty 2

## 2024-02-25 MED ORDER — ACETAMINOPHEN 10 MG/ML IV SOLN: 1000.0000 mg | Freq: Once | INTRAVENOUS | Status: DC | PRN

## 2024-02-25 MED ORDER — DROPERIDOL 2.5 MG/ML IJ SOLN: 0.6250 mg | Freq: Once | INTRAMUSCULAR | Status: DC

## 2024-02-25 MED ORDER — DROPERIDOL 2.5 MG/ML IJ SOLN
0.6250 mg | Freq: Once | INTRAMUSCULAR | Status: AC
  Administered 2024-02-25: 0.625 mg via INTRAVENOUS

## 2024-02-25 MED ORDER — DEXAMETHASONE SOD PHOSPHATE PF 10 MG/ML IJ SOLN
INTRAMUSCULAR | Status: DC | PRN
  Administered 2024-02-25: 10 mg via INTRAVENOUS

## 2024-02-25 MED ORDER — LIDOCAINE HCL 1 % IJ SOLN
INTRAMUSCULAR | Status: DC | PRN
  Administered 2024-02-25: 60 mL via INTRAMUSCULAR

## 2024-02-25 MED ORDER — LIDOCAINE HCL (PF) 1 % IJ SOLN
INTRAMUSCULAR | Status: AC
Start: 2024-02-25 — End: 2024-02-25
  Filled 2024-02-25: qty 30

## 2024-02-25 MED ORDER — FENTANYL CITRATE (PF) 100 MCG/2ML IJ SOLN: 25.0000 ug | INTRAMUSCULAR | Status: DC | PRN

## 2024-02-25 SURGICAL SUPPLY — 27 items
CANISTER SUCTION 3000ML PPV (SUCTIONS) IMPLANT
CHLORAPREP W/TINT 26 (MISCELLANEOUS) ×1 IMPLANT
CLIP TI LARGE 6 (CLIP) ×1 IMPLANT
COVER PROBE W GEL 5X96 (DRAPES) ×1 IMPLANT
COVER SURGICAL LIGHT HANDLE (MISCELLANEOUS) ×1 IMPLANT
DERMABOND ADVANCED .7 DNX12 (GAUZE/BANDAGES/DRESSINGS) ×1 IMPLANT
DEVICE DUBIN SPECIMEN MAMMOGRA (MISCELLANEOUS) ×1 IMPLANT
DRAPE CHEST BREAST 15X10 FENES (DRAPES) ×1 IMPLANT
ELECT COATED BLADE 2.86 ST (ELECTRODE) ×1 IMPLANT
ELECTRODE REM PT RTRN 9FT ADLT (ELECTROSURGICAL) ×1 IMPLANT
GLOVE BIO SURGEON STRL SZ 6 (GLOVE) ×1 IMPLANT
GLOVE INDICATOR 6.5 STRL GRN (GLOVE) ×1 IMPLANT
GOWN STRL REUS W/ TWL LRG LVL3 (GOWN DISPOSABLE) ×1 IMPLANT
GOWN STRL REUS W/ TWL XL LVL3 (GOWN DISPOSABLE) ×1 IMPLANT
KIT BASIN OR (CUSTOM PROCEDURE TRAY) ×1 IMPLANT
KIT MARKER MARGIN INK (KITS) ×1 IMPLANT
NDL HYPO 25GX1X1/2 BEV (NEEDLE) ×1 IMPLANT
NEEDLE HYPO 25GX1X1/2 BEV (NEEDLE) ×1 IMPLANT
PACK GENERAL/GYN (CUSTOM PROCEDURE TRAY) ×1 IMPLANT
SOLN 0.9% NACL POUR BTL 1000ML (IV SOLUTION) IMPLANT
STRIP CLOSURE SKIN 1/2X4 (GAUZE/BANDAGES/DRESSINGS) ×1 IMPLANT
SUT MNCRL AB 4-0 PS2 18 (SUTURE) ×1 IMPLANT
SUT SILK 2 0 SH (SUTURE) IMPLANT
SUT VIC AB 2-0 SH 27XBRD (SUTURE) IMPLANT
SUT VIC AB 3-0 SH 27X BRD (SUTURE) ×1 IMPLANT
SYR CONTROL 10ML LL (SYRINGE) ×1 IMPLANT
TOWEL GREEN STERILE (TOWEL DISPOSABLE) ×1 IMPLANT

## 2024-02-25 NOTE — Interval H&P Note (Signed)
 History and Physical Interval Note:  02/25/2024 8:55 AM  Kristine Bowman  has presented today for surgery, with the diagnosis of RIGHT BREAST CANCER.  The various methods of treatment have been discussed with the patient and family. After consideration of risks, benefits and other options for treatment, the patient has consented to  Procedure(s) with comments: BREAST LUMPECTOMY WITH RADIOACTIVE SEED LOCALIZATION (Right) - RIGHT BREAST SEED BRACKETED LUMPECTOMY RADIOACTIVE SEED GUIDED BREAST BIOPSY (Right) - RIGHT SEED LOCALIZED EXCISIONAL BIOPSY as a surgical intervention.  The patient's history has been reviewed, patient examined, no change in status, stable for surgery.  I have reviewed the patient's chart and labs.  Questions were answered to the patient's satisfaction.     Jina Nephew

## 2024-02-25 NOTE — Transfer of Care (Signed)
 Immediate Anesthesia Transfer of Care Note  Patient: Kristine Bowman  Procedure(s) Performed: BREAST LUMPECTOMY WITH RADIOACTIVE SEED LOCALIZATION (Right: Breast) RADIOACTIVE SEED GUIDED BREAST BIOPSY (Right: Breast)  Patient Location: PACU  Anesthesia Type:General  Level of Consciousness: awake  Airway & Oxygen Therapy: Patient Spontanous Breathing  Post-op Assessment: Report given to RN and Post -op Vital signs reviewed and stable  Post vital signs: Reviewed and stable  Last Vitals:  Vitals Value Taken Time  BP 132/85 02/25/24 11:15  Temp 36.4 C 02/25/24 11:11  Pulse 71 02/25/24 11:15  Resp 18 02/25/24 11:15  SpO2 92 % 02/25/24 11:15  Vitals shown include unfiled device data.  Last Pain:  Vitals:   02/25/24 1111  TempSrc:   PainSc: 0-No pain         Complications: No notable events documented.

## 2024-02-25 NOTE — Anesthesia Procedure Notes (Signed)
 Procedure Name: LMA Insertion Date/Time: 02/25/2024 9:29 AM  Performed by: Virgil Ee, CRNAPre-anesthesia Checklist: Patient identified, Patient being monitored, Timeout performed, Emergency Drugs available and Suction available Patient Re-evaluated:Patient Re-evaluated prior to induction Oxygen Delivery Method: Circle system utilized Preoxygenation: Pre-oxygenation with 100% oxygen Induction Type: IV induction Ventilation: Mask ventilation without difficulty LMA: LMA inserted LMA Size: 4.0 Tube type: Oral Number of attempts: 1 Placement Confirmation: positive ETCO2 and breath sounds checked- equal and bilateral Tube secured with: Tape Dental Injury: Teeth and Oropharynx as per pre-operative assessment

## 2024-02-25 NOTE — Anesthesia Preprocedure Evaluation (Signed)
 Anesthesia Evaluation  Patient identified by MRN, date of birth, ID band Patient awake    Reviewed: Allergy & Precautions, NPO status , Patient's Chart, lab work & pertinent test results  History of Anesthesia Complications Negative for: history of anesthetic complications  Airway Mallampati: I  TM Distance: >3 FB Neck ROM: Full    Dental  (+) Dental Advisory Given, Teeth Intact,    Pulmonary neg shortness of breath, neg sleep apnea, neg COPD, neg recent URI   breath sounds clear to auscultation       Cardiovascular (-) hypertension(-) angina (-) Past MI and (-) CHF negative cardio ROS  Rhythm:Regular     Neuro/Psych neg Seizures  Neuromuscular disease    GI/Hepatic negative GI ROS, Neg liver ROS,,,  Endo/Other  negative endocrine ROS    Renal/GU Renal InsufficiencyRenal diseaseLab Results      Component                Value               Date                      NA                       138                 01/21/2024                K                        4.2                 01/21/2024                CO2                      26                  01/21/2024                GLUCOSE                  99                  01/21/2024                BUN                      25 (H)              01/21/2024                CREATININE               1.19 (H)            01/21/2024                CALCIUM                  9.2                 01/21/2024                EGFR                     51 (L)  03/11/2023                GFRNONAA                 49 (L)              01/21/2024                Musculoskeletal  (+) Arthritis ,    Abdominal   Peds  Hematology negative hematology ROS (+)   Anesthesia Other Findings   Reproductive/Obstetrics                              Anesthesia Physical Anesthesia Plan  ASA: 2  Anesthesia Plan: General   Post-op Pain Management: Tylenol PO (pre-op)*  and Toradol IV (intra-op)*   Induction: Intravenous  PONV Risk Score and Plan: 4 or greater and Ondansetron , Dexamethasone , Propofol  infusion, TIVA and Midazolam   Airway Management Planned: LMA  Additional Equipment: None  Intra-op Plan:   Post-operative Plan: Extubation in OR  Informed Consent: I have reviewed the patients History and Physical, chart, labs and discussed the procedure including the risks, benefits and alternatives for the proposed anesthesia with the patient or authorized representative who has indicated his/her understanding and acceptance.     Dental advisory given  Plan Discussed with: CRNA  Anesthesia Plan Comments:         Anesthesia Quick Evaluation

## 2024-02-25 NOTE — Discharge Instructions (Addendum)
 [facility] Office Phone Number [phone]  BREAST BIOPSY/ PARTIAL MASTECTOMY: POST OP INSTRUCTIONS  Always review your discharge instruction sheet given to you by the facility where your surgery was performed.  IF YOU HAVE DISABILITY OR FAMILY LEAVE FORMS, YOU MUST BRING THEM TO THE OFFICE FOR PROCESSING.  DO NOT GIVE THEM TO YOUR DOCTOR.  Take 2 tylenol  (acetominophen) three times a day for 3 days.  If you still have pain, add ibuprofen with food in between if able to take this (if you have kidney issues or stomach issues, do not take ibuprofen).  If both of those are not enough, add the narcotic pain pill.  If you find you are needing a lot of this overnight after surgery, call the next morning for a refill.    Prescriptions will not be filled after 5pm or on week-ends. Take your usually prescribed medications unless otherwise directed You should eat very light the first 24 hours after surgery, such as soup, crackers, pudding, etc.  Resume your normal diet the day after surgery. Most patients will experience some swelling and bruising in the breast.  Ice packs and a good support bra will help.  Swelling and bruising can take several days to resolve.  It is common to experience some constipation if taking pain medication after surgery.  Increasing fluid intake and taking a stool softener will usually help or prevent this problem from occurring.  A mild laxative (Milk of Magnesia or Miralax ) should be taken according to package directions if there are no bowel movements after 48 hours. Unless discharge instructions indicate otherwise, you may remove your bandages 48 hours after surgery, and you may shower at that time.  You may have steri-strips (small skin tapes) in place directly over the incision.  These strips should be left on the skin at least for for 7-10 days.    ACTIVITIES:  You may resume regular daily activities (gradually increasing) beginning the next day.  Wearing a  good support bra or sports bra (or the breast binder) minimizes pain and swelling.  You may have sexual intercourse when it is comfortable. No heavy lifting for 1-2 weeks (not over around 10 pounds).  You may drive when you no longer are taking prescription pain medication, you can comfortably wear a seatbelt, and you can safely maneuver your car and apply brakes. RETURN TO WORK:  __________3-14 days depending on job. _______________ Kristine Bowman should see your doctor in the office for a follow-up appointment approximately two weeks after your surgery.  Your doctor's nurse will typically make your follow-up appointment when she calls you with your pathology report.  Expect your pathology report 3-4 business days after your surgery.  You may call to check if you do not hear from us  after three days.   WHEN TO CALL YOUR DOCTOR: Fever over 101.0 Nausea and/or vomiting. Extreme swelling or bruising. Continued bleeding from incision. Increased pain, redness, or drainage from the incision.  The clinic staff is available to answer your questions during regular business hours.  Please don't hesitate to call and ask to speak to one of the nurses for clinical concerns.  If you have a medical emergency, go to the nearest emergency room or call 911.  A surgeon from [facility] is always on call at the hospital.  For further questions, please visit [email or web address]

## 2024-02-25 NOTE — Op Note (Signed)
 Right Breast Radioactive seed bracketed lumpectomy, right breast seed localized excisional biopsy  Indications: This patient presents with history of right breast cancer x 2, upper outer quadrant, grade 2 invasive ductal carcinoma, receptors for one +/-/-, for the other +/+/-.  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 The risks, benefits, complications, treatment options, and expected outcomes were discussed with the patient. The possibilities of bleeding, infection, the need for additional procedures, failure to diagnose a condition, and creating a complication requiring other procedures or operations were discussed with the patient. The patient concurred with the proposed plan, giving informed consent.  The site of surgery properly noted/marked. The patient was taken to Operating Room # 5, identified, and the procedure verified as right breast seed localized lumpectomy x 2, seed localized excisional biopsy.    The right breast and chest were prepped and draped in standard fashion. A transverse laterally oriented incision was made near the previously placed radioactive seeds.  The two seeds at 10 o'clock were identified first and addressed first.   Dissection was carried down around the points of maximum signal intensity. The cautery was used to perform the dissection.   The specimen was inked with the margin marker paint kit.    Specimen radiography confirmed inclusion of the mammographic lesion, the clip, and the seed.  The background signal in the breast was zero.  Additional superior margin was taken.    The ADH was located at 12 o'clock and was near the areolar border.  We used the cautery to dissect through  the breast tissue and then took a small amount of breast tissue in this location corresponding to the clip and seed.  Specimen mammogram confirmed both the seed and the clip at this location as well.  Hemostasis was achieved with cautery.  The cavity was marked with clips on each border where the cancer was other than the anterior border.  Local anesthetic was infiltrated into the surrounding tissues.  The wound was irrigated.  The deep tissues were pulled together with interrupted 2-0 Vicryl sutures.  The skin was then closed with 3-0 vicryl interrupted deep dermal sutures and 4-0 monocryl running subcuticular suture.      Sterile dressings were applied. At the end of the operation, all sponge, instrument, and needle counts were correct.   Findings: Seeds, clips in specimens.  anterior margin is skin   Estimated Blood Loss:  min         Specimens:  1.  right breast tissue with seeds, 10 o'clock,  2.  right breast tissue with seed at 12 o'clock-ADH 3.  superior margin of specimen #1,          Complications:  None; patient tolerated the procedure well.         Disposition: PACU - hemodynamically stable.         Condition: stable

## 2024-02-26 ENCOUNTER — Encounter (HOSPITAL_COMMUNITY): Payer: Self-pay | Admitting: General Surgery

## 2024-02-29 LAB — SURGICAL PATHOLOGY

## 2024-03-01 ENCOUNTER — Ambulatory Visit: Payer: Self-pay | Admitting: General Surgery

## 2024-03-02 ENCOUNTER — Encounter: Payer: Self-pay | Admitting: *Deleted

## 2024-03-02 ENCOUNTER — Telehealth: Payer: Self-pay | Admitting: *Deleted

## 2024-03-02 NOTE — Telephone Encounter (Signed)
Ordered oncotype per Dr. Iruku. Sent requisition to pathology and exact sciences. 

## 2024-03-03 ENCOUNTER — Telehealth: Payer: Self-pay

## 2024-03-03 DIAGNOSIS — Z1379 Encounter for other screening for genetic and chromosomal anomalies: Secondary | ICD-10-CM | POA: Insufficient documentation

## 2024-03-03 NOTE — Telephone Encounter (Signed)
 I contacted  Ms. Kristine Bowman to discuss her genetic testing results. No pathogenic variants were identified in the 40 genes analyzed. Discussed that we do not know why she has breast cancer or why there is cancer in the family. It could be due to a different gene that we are not testing, or maybe our current technology may not be able to pick something up.  It will be important for her to keep in contact with genetics to keep up with whether additional testing may be needed.Detailed clinic note to follow.   The test report will be scanned into EPIC and will be located under the Molecular Pathology section of the Results Review tab.  A portion of the result report is included below for reference. Griffin was able to review the result in MyChart over the phone.    Santana Fryer, MS, CGC  Certified Genetic Counselor  Email: [email or web address]  Phone: [phone]

## 2024-03-03 NOTE — Progress Notes (Signed)
 Negative Genetic Testing Results   HPI:  Kristine Bowman was previously seen in the [facility] due to a personal history of breast cancer and concerns regarding a hereditary predisposition to cancer. Please refer to our prior cancer genetics clinic note for more information regarding our discussion, assessment and recommendations, at the time. Kristine Bowman recent genetic test results were disclosed to her, as were recommendations warranted by these results. These results and recommendations are discussed in more detail below.  CANCER HISTORY:  Oncology History  Malignant neoplasm of upper-outer quadrant of right breast in female, estrogen receptor positive (HCC)  01/06/2024 Mammogram   IMPRESSION: 1. There is an indeterminate 6 mm mass at the site of screening mammographic concern. Recommend ultrasound-guided biopsy for definitive characterization. 2. There is a 12 mm sonographically identified mass in the RIGHT upper outer breast. Recommend ultrasound-guided biopsy for definitive characterization. 3. There is an additional sonographically identified 6 mm round mass which may reflect a complicated cyst at 12 o'clock. Recommend ultrasound-guided aspiration with potential conversion to biopsy for definitive characterization. 4. No suspicious RIGHT axillary adenopathy.   RECOMMENDATION: 1. RIGHT breast ultrasound-guided biopsy x2 2. RIGHT breast ultrasound-guided aspiration with potential conversion to biopsy x1   01/19/2024 Initial Diagnosis   Malignant neoplasm of upper-outer quadrant of right breast in female, estrogen receptor positive (HCC)   01/21/2024 Cancer Staging   Staging form: Breast, AJCC 8th Edition - Clinical stage from 01/21/2024: Stage IA (cT1c, cN0, cM0, G2, ER+, PR-, HER2-) - Signed by [name], MD on 01/21/2024 Stage prefix: Initial diagnosis Histologic grading system: 3 grade system Laterality: Right Staged by: Pathologist and managing  physician Stage used in treatment planning: Yes National guidelines used in treatment planning: Yes Type of national guideline used in treatment planning: NCCN Staging comments: This was the 8cmfn     Pathology Results    1. Breast, right, needle core biopsy, 10 o'clock 8cmfn (heart clip) :       - INVASIVE DUCTAL CARCINOMA WITH PAPILLARY FEATURES, SEE NOTE       - DUCTAL CARCINOMA IN SITU, INTERMEDIATE GRADE       - TUBULE FORMATION: SCORE 3       - NUCLEAR PLEOMORPHISM: SCORE 2       - MITOTIC COUNT: SCORE 1       - TOTAL SCORE: 6       - OVERALL GRADE: 2       - LYMPHOVASCULAR INVASION: NOT IDENTIFIED       - CANCER LENGTH: 0.5 CM       - CALCIFICATIONS: NOT IDENTIFIED       - OTHER FINDINGS: NONE   2. Breast, right, needle core biopsy, 10 o'clock 7cmfn (ribbon clip) :       - INVASIVE DUCTAL CARCINOMA WITH MUCINOUS FEATURES, SEE NOTE       - DUCTAL CARCINOMA IN SITU, INTERMEDIATE GRADE       - TUBULE FORMATION: SCORE 3       - NUCLEAR PLEOMORPHISM: SCORE 2       - MITOTIC COUNT: SCORE 1       - TOTAL SCORE: 6       - OVERALL GRADE: 2       - LYMPHOVASCULAR INVASION: NOT IDENTIFIED       - CANCER LENGTH: 0.4 CM       - CALCIFICATIONS: NOT IDENTIFIED       - OTHER FINDINGS: NONE   3. Breast, right, needle  core biopsy, 12 o'clock 5cmfn (coil clip) :       - FIBROCYSTIC CHANGE AND APOCRINE METAPLASIA       - NO MALIGNANCY IDENTIFIED   ADDENDUM  1A) Breast, right, needle core biopsy, 10 o'clock 8cmfn (heart clip)  PROGNOSTIC INDICATORS   Results:  IMMUNOHISTOCHEMICAL AND MORPHOMETRIC ANALYSIS PERFORMED MANUALLY  The tumor cells are NEGATIVE for Her2 (0).  Estrogen Receptor:  95%, POSITIVE, STRONG STAINING INTENSITY  Progesterone  Receptor:  0%, NEGATIVE  Proliferation Marker Ki67:  5%    IMMUNOHISTOCHEMICAL AND MORPHOMETRIC ANALYSIS PERFORMED MANUALLY The tumor cells are NEGATIVE for Her2 (0). Estrogen Receptor:  100%, POSITIVE, STRONG STAINING INTENSITY Progesterone   Receptor:  20%, POSITIVE, MODERATE STAINING INTENSITY Proliferation Marker Ki67:  10%     Genetic Testing   Kristine Bowman had genetic testing done due to her personal history of breast cancer. Genetic Testing Ordered: [facility] CancerNext+RNAinsight (40 genes) Report Date: 01/29/2024 Result: NEGATIVE [facility] CancerNext + RNAinsight gene panel which includes sequencing, rearrangement analysis, and RNA analysis for the following 40 genes: APC, ATM, BAP1, BARD1, BMPR1A, BRCA1, BRCA2, BRIP1, CDH1, CDKN2A, CHEK2, FH, FLCN, MET, MLH1, MSH2, MSH6, MUTYH, NF1, NTHL1, PALB2, PMS2, PTEN, RAD51C, RAD51D, RPS20, SMAD4, STK11, TP53, TSC1, TSC2, and VHL (sequencing and deletion/duplication); AXIN2, HOXB13, MBD4, MSH3, POLD1 and POLE (sequencing only); EPCAM and GREM1 (deletion/duplication only).   Malignant neoplasm of overlapping sites of right breast in female, estrogen receptor positive (HCC)  01/19/2024 Initial Diagnosis   Malignant neoplasm of overlapping sites of right breast in female, estrogen receptor positive (HCC)   01/21/2024 Cancer Staging   Staging form: Breast, AJCC 8th Edition - Clinical stage from 01/21/2024: Stage IA (cT1c, cN0, cM0, G2, ER+, PR+, HER2-) - Signed by [name], MD on 01/21/2024 Stage prefix: Initial diagnosis Nuclear grade: G2 Histologic grading system: 3 grade system Laterality: Right Staged by: Pathologist and managing physician Stage used in treatment planning: Yes National guidelines used in treatment planning: Yes Type of national guideline used in treatment planning: NCCN Staging comments: On 7cmfm      FAMILY HISTORY:  We obtained a detailed, 4-generation family history.  Significant diagnoses are listed below: Family History  Problem Relation Age of Onset   Hypertension Mother 22   Arthritis Mother    Heart disease Mother    Heart disease Father 66       quadruple bypass   Skin cancer Father    Skin cancer Brother        Non-Melanoma   Heart disease  Maternal Grandfather    Breast cancer Paternal Grandmother 56   Heart disease Paternal Grandfather    Skin cancer Paternal Uncle        Non-Melanoma   Breast cancer Paternal Cousin 34     GENETIC TEST RESULTS: Genetic testing reported out on 01/29/2024 through the [employer] CancerNext+RNA 40 gene panel found no pathogenic mutations. [facility] CancerNext + RNAinsight gene panel which includes sequencing, rearrangement analysis, and RNA analysis for the following 40 genes: APC, ATM, BAP1, BARD1, BMPR1A, BRCA1, BRCA2, BRIP1, CDH1, CDKN2A, CHEK2, FH, FLCN, MET, MLH1, MSH2, MSH6, MUTYH, NF1, NTHL1, PALB2, PMS2, PTEN, RAD51C, RAD51D, RPS20, SMAD4, STK11, TP53, TSC1, TSC2, and VHL (sequencing and deletion/duplication); AXIN2, HOXB13, MBD4, MSH3, POLD1 and POLE (sequencing only); EPCAM and GREM1 (deletion/duplication only). The test report has been scanned into EPIC and is located under the Molecular Pathology section of the Results Review tab.  A portion of the result report is included below for reference.    We  discussed with Kristine Bowman that because current genetic testing is not perfect, it is possible there may be a gene mutation in one of these genes that current testing cannot detect, but that chance is small.  We also discussed, that there could be another gene that has not yet been discovered, or that we have not yet tested, that is responsible for the cancer diagnoses in the family. It is also possible there is a hereditary cause for the cancer in the family that Kristine Bowman did not inherit and therefore was not identified in her testing.  Therefore, it is important to remain in touch with cancer genetics in the future so that we can continue to offer Kristine Bowman the most up to date genetic testing.   ADDITIONAL GENETIC TESTING: We discussed with Kristine Bowman that there are other genes that are associated with increased cancer risk that can be analyzed. Should Kristine Bowman wish to pursue additional  genetic testing, we are happy to discuss and coordinate this testing, at any time.    We discussed with Kristine Bowman that her genetic testing was fairly extensive.  If there are genes identified to increase cancer risk that can be analyzed in the future, we would be happy to discuss and coordinate this testing at that time.    CANCER SCREENING RECOMMENDATIONS: Kristine Bowman test result is considered negative (normal).  This means that we have not identified a hereditary cause for her personal history of breast cancer at this time. Most cancers happen by chance and this negative test suggests that her personal history of cancer may fall into this category.    Possible reasons for Kristine Bowman's negative genetic test include:  1. There may be a gene mutation in one of these genes that current testing methods cannot detect but that chance is small.  2. There could be another gene that has not yet been discovered, or that we have not yet tested, that is responsible for the cancer diagnoses in the family.  3.  There may be no hereditary risk for cancer in the family. The cancers in Kristine Bowman and/or her family may be sporadic/familial or due to other genetic and environmental factors. 4. It is also possible there is a hereditary cause for the cancer in the family that Kristine Bowman did not inherit.  Therefore, it is recommended she continue to follow the cancer management and screening guidelines provided by her oncology and primary healthcare provider. An individual's cancer risk and medical management are not determined by genetic test results alone. Overall cancer risk assessment incorporates additional factors, including personal medical history, family history, and any available genetic information that may result in a personalized plan for cancer prevention and surveillance  Given Kristine Bowman's personal and family histories, we must interpret these negative results with some caution.  Families with  features suggestive of hereditary risk for cancer tend to have multiple family members with cancer, diagnoses in multiple generations and diagnoses before the age of 12. Kristine Bowman family exhibits some of these features. Thus, this result may simply reflect our current inability to detect all mutations within these genes or there may be a different gene that has not yet been discovered or tested.   An individual's cancer risk and medical management are not determined by genetic test results alone. Overall cancer risk assessment incorporates additional factors, including personal medical history, family history, and any available genetic information that may result in a personalized plan for cancer prevention and surveillance.  RECOMMENDATIONS FOR FAMILY MEMBERS:  Individuals in this family might be at some increased risk of developing cancer, over the general population risk, simply due to the family history of cancer.  We recommended women in this family have a yearly mammogram beginning at age 39, or 82 years younger than the earliest onset of cancer, an annual clinical breast exam, and perform monthly breast self-exams. Women in this family should also have a gynecological exam as recommended by their primary provider. All family members should be referred for colonoscopy starting at age 105, or 39 years younger than the earliest onset of cancer.  It is also possible there is a hereditary cause for the cancer in Kristine Bowman's family that she did not inherit and therefore was not identified in her.  Based on Ms. Tellez's family history, we recommended her paternal 1st cousin, who was diagnosed with breast cancer at age 16, have genetic counseling and testing. Kristine Bowman will let us  know if we can be of any assistance in coordinating genetic counseling and/or testing for this family member.   FOLLOW-UP: Lastly, we discussed with Ms. Hector that cancer genetics is a rapidly advancing field and it is  possible that new genetic tests will be appropriate for her and/or her family members in the future. We encouraged her to remain in contact with cancer genetics on an annual basis so we can update her personal and family histories and let her know of advances in cancer genetics that may benefit this family.   Our contact number was provided. Ms. Pecor questions were answered to her satisfaction, and she knows she is welcome to call us  at anytime with additional questions or concerns.   Santana Fryer, MS, CGC  Certified Genetic Counselor  Email: [email or web address]  Phone: [phone]

## 2024-03-04 NOTE — Anesthesia Postprocedure Evaluation (Signed)
 Anesthesia Post Note  Patient: Kristine Bowman  Procedure(s) Performed: BREAST LUMPECTOMY WITH RADIOACTIVE SEED LOCALIZATION (Right: Breast) RADIOACTIVE SEED GUIDED BREAST BIOPSY (Right: Breast)     Patient location during evaluation: PACU Anesthesia Type: General Level of consciousness: awake and alert Pain management: pain level controlled Vital Signs Assessment: post-procedure vital signs reviewed and stable Respiratory status: spontaneous breathing, nonlabored ventilation and respiratory function stable Cardiovascular status: blood pressure returned to baseline and stable Postop Assessment: no apparent nausea or vomiting Anesthetic complications: no   No notable events documented.                 Bunny Kleist

## 2024-03-11 ENCOUNTER — Encounter (HOSPITAL_COMMUNITY): Payer: Self-pay

## 2024-03-11 ENCOUNTER — Encounter (HOSPITAL_BASED_OUTPATIENT_CLINIC_OR_DEPARTMENT_OTHER): Payer: Self-pay | Admitting: Family Medicine

## 2024-03-11 ENCOUNTER — Ambulatory Visit (INDEPENDENT_AMBULATORY_CARE_PROVIDER_SITE_OTHER): Admitting: Family Medicine

## 2024-03-11 VITALS — BP 132/90 | HR 76 | Ht 66.0 in | Wt 192.3 lb

## 2024-03-11 DIAGNOSIS — M1712 Unilateral primary osteoarthritis, left knee: Secondary | ICD-10-CM

## 2024-03-11 NOTE — Assessment & Plan Note (Addendum)
 Patient was previously following with Dr. ONEIDA related to left knee osteoarthritis and other musculoskeletal issues that would arise from time to time.  Related to her left knee, she has received prior injections including steroid injections and viscosupplementation.  In the past, she has done well with Visco when received.  It has been greater than 6 months since her last Visco injections.  She did see Dr. Cleatrice recently who provided a steroid injection a little over 2 months ago.  She reports that knee pain is still doing well at this time.  Given prior good response with Visco, she would like to proceed with these again in the future once pain is returning.  Patient with osteoarthritis of the left knee joint.  She has knee pain which interferes with functional and activities of daily living.   This patient has experienced inadequate response, adverse effects and/or intolerance with conservative treatments such as acetaminophen, NSAIDS, topical creams, physical therapy or regular exercise, knee bracing and/or weight loss.   Would be reasonable to proceed with viscosupplementation as next step.  Given that she is doing fairly well currently, we can hold off for now and look to proceed with this.  Advised for her to reach out whenever she is having issues and wants to move forward with Visco supplementation.

## 2024-03-11 NOTE — Patient Instructions (Signed)
  Medication Instructions:  Your physician recommends that you continue on your current medications as directed. Please refer to the Current Medication list given to you today. --If you need a refill on any your medications before your next appointment, please call your pharmacy first. If no refills are authorized on file call the office.-- Lab Work: Your physician has recommended that you have lab work today: none ordered today If you have labs (blood work) drawn today and your tests are completely normal, you will receive your results via MyChart message OR a phone call from our staff.  Please ensure you check your voicemail in the event that you authorized detailed messages to be left on a delegated number. If you have any lab test that is abnormal or we need to change your treatment, we will call you to review the results.  Referrals/Procedures/Imaging: none ordered   Follow-Up: Your next appointment:   Your physician recommends that you schedule a follow-up appointment in: As needed with Dr. de Cuba  You will receive a text message or e-mail with a link to a survey about your care and experience with us  today! We would greatly appreciate your feedback!   Thanks for letting us  be apart of your health journey!!  Primary Care and Sports Medicine   Dr. Quintin sheerer Cuba   We encourage you to activate your patient portal called MyChart.  Sign up information is provided on this After Visit Summary.  MyChart is used to connect with patients for Virtual Visits (Telemedicine).  Patients are able to view lab/test results, encounter notes, upcoming appointments, etc.  Non-urgent messages can be sent to your provider as well. To learn more about what you can do with MyChart, please visit --  [email or web address].

## 2024-03-11 NOTE — Progress Notes (Signed)
    Procedures performed today:    None.  Independent interpretation of notes and tests performed by another provider:   None.  Brief History, Exam, Impression, and Recommendations:    BP (!) 132/90   Pulse 76   Ht 5' 6 (1.676 m)   Wt 192 lb 4.8 oz (87.2 kg)   SpO2 97%   BMI 31.04 kg/m   Primary osteoarthritis of left knee Assessment & Plan: Patient was previously following with Dr. ONEIDA related to left knee osteoarthritis and other musculoskeletal issues that would arise from time to time.  Related to her left knee, she has received prior injections including steroid injections and viscosupplementation.  In the past, she has done well with Visco when received.  It has been greater than 6 months since her last Visco injections.  She did see Dr. Cleatrice recently who provided a steroid injection a little over 2 months ago.  She reports that knee pain is still doing well at this time.  Given prior good response with Visco, she would like to proceed with these again in the future once pain is returning.  Patient with osteoarthritis of the left knee joint.  She has knee pain which interferes with functional and activities of daily living.   This patient has experienced inadequate response, adverse effects and/or intolerance with conservative treatments such as acetaminophen, NSAIDS, topical creams, physical therapy or regular exercise, knee bracing and/or weight loss.   Would be reasonable to proceed with viscosupplementation as next step.  Given that she is doing fairly well currently, we can hold off for now and look to proceed with this.  Advised for her to reach out whenever she is having issues and wants to move forward with Visco supplementation.   Return if symptoms worsen or fail to improve.   ___________________________________________ Jaskaran Dauzat de [place], MD, ABFM, [facility]

## 2024-03-12 ENCOUNTER — Encounter: Payer: Self-pay | Admitting: *Deleted

## 2024-03-12 ENCOUNTER — Telehealth: Payer: Self-pay | Admitting: *Deleted

## 2024-03-12 DIAGNOSIS — Z170 Estrogen receptor positive status [ER+]: Secondary | ICD-10-CM

## 2024-03-12 NOTE — Telephone Encounter (Signed)
 Received oncotype results of 14/5%. Left message for a return phone call. Referral placed for Dr. Shannon.

## 2024-03-15 ENCOUNTER — Telehealth: Payer: Self-pay | Admitting: *Deleted

## 2024-03-15 NOTE — Telephone Encounter (Signed)
 Received back from patient. Informed her of the oncotype results and let her know the next steps is xrt.  Patient verbalized understanding.

## 2024-03-16 ENCOUNTER — Encounter: Payer: Self-pay | Admitting: *Deleted

## 2024-03-16 ENCOUNTER — Telehealth: Payer: Self-pay | Admitting: Radiation Oncology

## 2024-03-16 NOTE — Telephone Encounter (Signed)
 LVM to schedule CON30/CT SIM with Dr. Shannon

## 2024-03-16 NOTE — Telephone Encounter (Signed)
 Pt returned call to schedule with Dr. Shannon. Pt advised at this time she does not want to schedule CT SIM, she only wants to schedule consult with Dr.Kinard to review pathology and discuss radiation.I advised we typically schedule both appts on different days to minimize any delay for treatment. She verbalized understanding but still did not want both appts. Consult scheduled and Dr. Colton team notified via inbasket.

## 2024-03-17 ENCOUNTER — Telehealth: Payer: Self-pay | Admitting: Family Medicine

## 2024-03-17 NOTE — Telephone Encounter (Signed)
 Please call patient and see if she has already had a flu shot this year and if not we can schedule her here.  If she declines then please mark her chart

## 2024-03-18 NOTE — Telephone Encounter (Signed)
 Spoke with patient. She declines the flu shot  Documented this in patient chart.

## 2024-03-22 ENCOUNTER — Ambulatory Visit

## 2024-03-22 ENCOUNTER — Ambulatory Visit: Admitting: Radiation Oncology

## 2024-03-22 NOTE — Progress Notes (Signed)
 Location of Breast Cancer:{:21944} ***  Histology per Pathology Report:    Receptor Status: ER(+), PR (+), Her2-neu (-), Ki-67(+)  Did patient present with symptoms (if so, please note symptoms) or was this found on screening mammography?: ***  Past/Anticipated interventions by surgeon, if any:{t:21944} ***  Past/Anticipated interventions by medical oncology, if any: Chemotherapy ***  Lymphedema issues, if any:  {:18581} {t:21944}   Pain issues, if any:  {:18581} {PAIN DESCRIPTION:21022940}  SAFETY ISSUES: Prior radiation? {:18581} Pacemaker/ICD? {:18581} Possible current pregnancy?{:18581} Is the patient on methotrexate? {:18581}  Current Complaints / other details:  ***

## 2024-03-23 ENCOUNTER — Inpatient Hospital Stay: Admitting: Hematology and Oncology

## 2024-03-23 NOTE — Progress Notes (Signed)
 Radiation Oncology         (336) [phone] ________________________________  Name: Kristine Bowman MRN: [id number]  Date: 03/24/2024  DOB: November 01, 1955  Re-Evaluation Note  CC: [name], MD  [name], MD  No diagnosis found. 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 She was seen in the multidisciplinary breast clinic on 01/21/24.  Since consultation, she underwent genetic testing on 01/21/24. Results showed no pathogenic mutations contributing to her recent diagnosis.  She opted to proceed with a right breast lumpectomy with radioactive seed localization w/o nodal biopsy on 02/25/24 under the care of Dr. Aron. Pathology from the procedure revealed: a tumor size of 2.4 cm and histology of grade 2 invasive ductal carcinoma with mucinous features and intermediate nuclear grade DCIS. All margins are negative for invasive carcinoma. Closest margin is 1 mm from the posterior margin and 2.5 cm from the posterior margin for DCIS. Prognostic indicators significant for: estrogen receptor positive, 95-100% ; progesterone  receptor positive, 10-20% ; Proliferation marker Ki67 at 5-10%; Her2 status negative by immunohistochemistry; Grade 2. Lumpectomy of the right breast mass at 12 o'clock demonstrated atypical ductal hyperplasia, negative for a residual papilloma.    Oncotype DX was obtained on the final surgical sample and the recurrence score of 14 predicts a risk of recurrence outside the breast over the next 9 years of 5%, if the patient's only systemic therapy is an antiestrogen for 5 years.  It also predicts no significant benefit from chemotherapy.  During her most recent post-op follow up with Dr. Aron on 03/12/24, patient reported recovering from procedure quite well with no  significant pain.  On review of systems, the patient reports ***. She denies *** and any other symptoms.    Allergies:  is allergic to oxycodone , codeine, hydrocodone, metronidazole , tramadol , betadine [povidone iodine], latex, and meloxicam .  Meds: Current Outpatient Medications  Medication Sig Dispense Refill   ibuprofen  (ADVIL ) 800 MG tablet Take 800 mg by mouth as needed for moderate pain (pain score 4-6).     OVER THE COUNTER MEDICATION Take 1 packet by mouth daily. [place] Multivitamin Packets     No current facility-administered medications for this visit.    Physical Findings: The patient is in no acute distress. Patient is alert and oriented.  vitals were not taken for this visit.  No significant changes. Lungs are clear to auscultation bilaterally. Heart has regular rate and rhythm. No palpable cervical, supraclavicular, or axillary adenopathy. Abdomen soft, non-tender, normal bowel sounds.  Left Breast: no palpable mass, nipple discharge or bleeding. Right Breast: ***  Lab Findings: Lab Results  Component Value Date   WBC 5.0 02/20/2024   HGB 12.4 02/20/2024   HCT 38.0 02/20/2024   MCV 93.4 02/20/2024   PLT 262 02/20/2024    Radiographic Findings: MM Breast Surgical Specimen Result Date: 02/25/2024 CLINICAL DATA:  68 year old with biopsy-proven multifocal invasive ductal carcinoma and DCIS involving the UPPER OUTER QUADRANT of the RIGHT breast (ribbon clip, heart clip) and a biopsy-proven papilloma with atypical ductal hyperplasia in the upper RIGHT breast (barbell clip). Radioactive seed localization was performed yesterday in anticipation of today's lumpectomy. EXAM: SPECIMEN RADIOGRAPH OF THE RIGHT BREAST x 2 COMPARISON:  Previous exam(s). FINDINGS: Status post excision of the RIGHT breast. Images are submitted for interpretation post-operatively. The  initial image obtained at 10:12 a.m. contains the ribbon shaped tissue marking clip, the heart shaped tissue marking  clip, and the 2 radioactive seeds. The seeds are intact. The second image obtained at 10:26 a.m. contains the barbell shaped tissue marking clip associated with the ADH, the adjacent coil shaped tissue marking clip, and the radioactive seed. The seed is intact. IMPRESSION: Specimen radiographs of the RIGHT breast (x 2). Electronically Signed   By: Debby Satterfield M.D.   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 Radioactive seed localization was performed yesterday in anticipation of today's lumpectomy. EXAM: SPECIMEN RADIOGRAPH OF THE RIGHT BREAST x 2 COMPARISON:  Previous exam(s). FINDINGS: Status post excision of the RIGHT breast. Images are submitted for interpretation post-operatively. The initial image obtained at 10:12 a.m. contains the ribbon shaped tissue marking clip, the heart shaped tissue marking clip, and the 2 radioactive seeds. The seeds are intact. The second image obtained at 10:26 a.m. contains the barbell shaped tissue marking clip associated with the ADH, the adjacent coil shaped tissue marking clip, and the radioactive seed. The seed is intact. IMPRESSION: Specimen radiographs of the RIGHT breast (x 2). Electronically Signed   By: Debby Satterfield M.D.   On: 02/25/2024 11:01   MM RT RADIOACTIVE SEED LOC MAMMO GUIDE Result Date: 02/24/2024 CLINICAL DATA:  68 year old with biopsy-proven multifocal invasive ductal carcinoma and DCIS (ribbon and heart clip) involving the UPPER OUTER QUADRANT of the RIGHT breast and a biopsy-proven papilloma with atypical ductal hyperplasia (barbell clip) in the upper RIGHT breast. Radioactive seed localization is performed in anticipation of  lumpectomy which is scheduled for tomorrow. EXAM: MAMMOGRAPHIC GUIDED RADIOACTIVE SEED LOCALIZATION OF THE RIGHT BREAST x 3 COMPARISON:  Previous exam(s). FINDINGS: Patient presents for radioactive seed localization prior to RIGHT breast lumpectomy. I met with the patient and we discussed the procedure of seed localization including benefits and alternatives. We discussed the high likelihood of a successful procedure. We discussed the risks of the procedure including infection, bleeding, tissue injury and further surgery. We discussed the low dose of radioactivity involved in the procedure. Informed, written consent was given. The usual time-out protocol was performed immediately prior to the procedure. Using mammographic guidance, sterile technique sterile technique with chlorhexidine  as skin antisepsis, 1% lidocaine  as local anesthetic, I-125 radioactive seeds were used to localize the ribbon shaped and heart shaped tissue marking clips associated with the IDC/DCIS and the barbell shaped tissue marking clip associated with the papilloma containing ADH using a lateral approach. The follow-up mammogram images confirm that the seeds are in the expected location; the seeds are immediately adjacent to the ribbon clip and heart clip, and the seed is approximately 0.5 cm inferomedial to the barbell clip. Of note, the coil shaped tissue marking clip associated with benign fibrocystic changes and apocrine metaplasia is adjacent to the barbell clip. The images are marked for Dr. Aron. Follow-up survey of the patient confirms the presence of the radioactive seeds. #1) IDC, ribbon clip: Order number of I-125 seed: [id number] Total activity: 0.253 mCi Reference Date: 02/09/2024 #2) IDC, heart clip: Order number of I-125 seed: [id number] Total activity: 0.253 mCi Reference Date: 02/09/2024 #3) ADH, barbell clip: Order number of I-125 seed: [id number] Total activity: 0.236 mCi  Reference Date: 10/09/2023 The patient tolerated the  procedure well without apparent immediate complications. She was released from the [facility] with instructions regarding seed removal. 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 Electronically Signed   By: Debby Satterfield M.D.   On: 02/24/2024 09:16   MM RT RADIO SEED EA ADD LESION LOC MAMMO Result Date: 02/24/2024 CLINICAL DATA:  68 year old with biopsy-proven multifocal invasive ductal carcinoma and DCIS (ribbon and heart clip) involving the UPPER OUTER QUADRANT of the RIGHT breast and a biopsy-proven papilloma with atypical ductal hyperplasia (barbell clip) in the upper RIGHT breast. Radioactive seed localization is performed in anticipation of lumpectomy which is scheduled for tomorrow. EXAM: MAMMOGRAPHIC GUIDED RADIOACTIVE SEED LOCALIZATION OF THE RIGHT BREAST x 3 COMPARISON:  Previous exam(s). FINDINGS: Patient presents for radioactive seed localization prior to RIGHT breast lumpectomy. I met with the patient and we discussed the procedure of seed localization including benefits and alternatives. We discussed the high likelihood of a successful procedure. We discussed the risks of the procedure including infection, bleeding, tissue injury and further surgery. We discussed the low dose of radioactivity involved in the procedure. Informed, written consent was given. The usual time-out protocol was performed immediately prior to the procedure. Using mammographic guidance, sterile technique sterile technique with chlorhexidine  as skin antisepsis, 1% lidocaine  as local anesthetic, I-125 radioactive seeds were used to localize the ribbon shaped and heart shaped tissue marking clips associated with the IDC/DCIS and the barbell shaped tissue marking clip associated with the papilloma containing ADH using a lateral approach. The follow-up mammogram images confirm that the seeds are in the expected location; the seeds  are immediately adjacent to the ribbon clip and heart clip, and the seed is approximately 0.5 cm inferomedial to the barbell clip. Of note, the coil shaped tissue marking clip associated with benign fibrocystic changes and apocrine metaplasia is adjacent to the barbell clip. The images are marked for Dr. Aron. Follow-up survey of the patient confirms the presence of the radioactive seeds. #1) IDC, ribbon clip: Order number of I-125 seed: [id number] Total activity: 0.253 mCi Reference Date: 02/09/2024 #2) IDC, heart clip: Order number of I-125 seed: [id number] Total activity: 0.253 mCi Reference Date: 02/09/2024 #3) ADH, barbell clip: Order number of I-125 seed: [id number] Total activity: 0.236 mCi Reference Date: 10/09/2023 The patient tolerated the procedure well without apparent immediate complications. She was released from the Breast Center with instructions regarding seed removal. 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 Electronically Signed   By: Debby Satterfield M.D.   On: 02/24/2024 09:16   MM RT RADIO SEED EA ADD LESION LOC MAMMO Result Date: 02/24/2024 CLINICAL DATA:  68 year old with biopsy-proven multifocal invasive ductal carcinoma and DCIS (ribbon and heart clip) involving the UPPER OUTER QUADRANT of the RIGHT breast and a biopsy-proven papilloma with atypical ductal hyperplasia (barbell clip) in the upper RIGHT breast. Radioactive seed localization is performed in anticipation of lumpectomy which is scheduled for tomorrow. EXAM: MAMMOGRAPHIC GUIDED RADIOACTIVE SEED LOCALIZATION OF THE RIGHT BREAST x 3 COMPARISON:  Previous exam(s). FINDINGS: Patient presents for radioactive seed localization prior to RIGHT breast lumpectomy. I met with the patient and we discussed the procedure of seed localization including benefits and alternatives. We discussed the high likelihood of a successful procedure. We  discussed the risks of the procedure including infection, bleeding, tissue injury and further surgery. We discussed  the low dose of radioactivity involved in the procedure. Informed, written consent was given. The usual time-out protocol was performed immediately prior to the procedure. Using mammographic guidance, sterile technique sterile technique with chlorhexidine  as skin antisepsis, 1% lidocaine  as local anesthetic, I-125 radioactive seeds were used to localize the ribbon shaped and heart shaped tissue marking clips associated with the IDC/DCIS and the barbell shaped tissue marking clip associated with the papilloma containing ADH using a lateral approach. The follow-up mammogram images confirm that the seeds are in the expected location; the seeds are immediately adjacent to the ribbon clip and heart clip, and the seed is approximately 0.5 cm inferomedial to the barbell clip. Of note, the coil shaped tissue marking clip associated with benign fibrocystic changes and apocrine metaplasia is adjacent to the barbell clip. The images are marked for Dr. Aron. Follow-up survey of the patient confirms the presence of the radioactive seeds. #1) IDC, ribbon clip: Order number of I-125 seed: [id number] Total activity: 0.253 mCi Reference Date: 02/09/2024 #2) IDC, heart clip: Order number of I-125 seed: [id number] Total activity: 0.253 mCi Reference Date: 02/09/2024 #3) ADH, barbell clip: Order number of I-125 seed: [id number] Total activity: 0.236 mCi Reference Date: 10/09/2023 The patient tolerated the procedure well without apparent immediate complications. She was released from the [facility] with instructions regarding seed removal. 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 Electronically Signed   By: Debby Satterfield M.D.   On: 02/24/2024 09:16    Impression: Stage IA (cT1c, N0, M0) Multifocal Invasive Ductal  Carcinoma of the right breast ER+ / PR+ / Her2-, Grade 2  pT2  Patient continues to heal from her lumpectomy ***. Final pathology reveals close (1 mm) margins. Dr. Shannon recommends radiation to reduce the risk of locoregional disease recurrence.   Today we discussed the natural course of breast cancer. We highlighted the role of radiotherapy in the management and focused on the details of logistics and delivery. We reviewed the anticipated acute and late sequelae associated with radiation in this setting. The patient was encouraged to ask questions that I answered to the best of my ability. Patient expressed readiness to proceed with treatment. *** A consent form was reviewed and signed today.    Plan: Patient is scheduled for CT simulation on ***. Will plan to begin radiation approximately one week after simulation. Plan to deliver *** Gy in *** fractions to the *** breast followed by a boost of *** Gy in *** fractions to the lumpectomy cavity.   -----------------------------------   Leeroy Due, PA-C   This document serves as a record of services personally performed by Leeroy Due, PA-C. It was created on his behalf by Reymundo Cartwright, a trained medical scribe. The creation of this record is based on the scribe's personal observations and the provider's statements to them. This document has been checked and approved by the attending provider.

## 2024-03-24 ENCOUNTER — Inpatient Hospital Stay: Attending: Hematology and Oncology | Admitting: Hematology and Oncology

## 2024-03-24 ENCOUNTER — Ambulatory Visit
Admission: RE | Admit: 2024-03-24 | Discharge: 2024-03-24 | Disposition: A | Discharge status: Home / Self Care | Source: Ambulatory Visit | Attending: Radiation Oncology | Admitting: Radiation Oncology

## 2024-03-24 ENCOUNTER — Encounter: Payer: Self-pay | Admitting: Radiation Oncology

## 2024-03-24 VITALS — BP 137/91 | HR 62 | Temp 97.2°F | Resp 18 | Ht 66.0 in | Wt 194.0 lb

## 2024-03-24 VITALS — BP 131/87 | HR 66 | Temp 97.6°F | Resp 17 | Wt 192.8 lb

## 2024-03-24 DIAGNOSIS — Z1721 Progesterone receptor positive status: Secondary | ICD-10-CM | POA: Insufficient documentation

## 2024-03-24 DIAGNOSIS — Z1732 Human epidermal growth factor receptor 2 negative status: Secondary | ICD-10-CM | POA: Insufficient documentation

## 2024-03-24 DIAGNOSIS — C50411 Malignant neoplasm of upper-outer quadrant of right female breast: Secondary | ICD-10-CM

## 2024-03-24 DIAGNOSIS — Z170 Estrogen receptor positive status [ER+]: Secondary | ICD-10-CM | POA: Insufficient documentation

## 2024-03-24 NOTE — Progress Notes (Signed)
 [facility] CONSULT NOTE  Patient Care Team: [name], MD as PCP - General (Family Medicine) Gerome, Devere HERO, RN as Oncology Nurse Navigator Tyree Nanetta SAILOR, RN as Oncology Nurse Navigator Aron Shoulders, MD as Consulting Physician (General Surgery) [name], MD as Consulting Physician (Hematology and Oncology) Shannon Agent, MD as Consulting Physician (Radiation Oncology)  CHIEF COMPLAINTS/PURPOSE OF CONSULTATION:  New diagnosis of breast cancer  ASSESSMENT & PLAN:   Assessment and Plan Assessment & Plan Estrogen receptor positive stage 1 breast cancer, multifocal. This is a very pleasant 68 year old postmenopausal female patient with multifocal right breast invasive ductal carcinoma, 1 area being ER/PR positive, area being ER positive and PR negative with variable Ki-67 score of 5 to 10% originally referred to breast [facility] for additional recommendations.     Breast cancer, status post surgical resection Status post resection of 2.4 cm tumor with clear margins. Low Oncotype DX score indicates low recurrence risk with standard care. She declined antiestrogen therapy due to side effect concerns.  - Meet with radiation oncologist Dr. Burlene to discuss radiation therapy options. - Consider radiation therapy to reduce local recurrence risk. - She will RTC after adj radiation to review role of antiestrogen therapy once again. - Discuss potential use of MRD testing for early detection of recurrence. - Plan for annual diagnostic mammograms for surveillance.  Thank you for consulting us  in the care of this patient.  Please do not hesitate to contact us  with any additional questions   HISTORY OF PRESENTING ILLNESS:  Anyra Kaufman 68 y.o. female is here because of new diagnosis of breast cancer  Oncology History  Malignant neoplasm of upper-outer quadrant of right breast in female, estrogen receptor positive (HCC)  01/06/2024 Mammogram   IMPRESSION: 1.  There is an indeterminate 6 mm mass at the site of screening mammographic concern. Recommend ultrasound-guided biopsy for definitive characterization. 2. There is a 12 mm sonographically identified mass in the RIGHT upper outer breast. Recommend ultrasound-guided biopsy for definitive characterization. 3. There is an additional sonographically identified 6 mm round mass which may reflect a complicated cyst at 12 o'clock. Recommend ultrasound-guided aspiration with potential conversion to biopsy for definitive characterization. 4. No suspicious RIGHT axillary adenopathy.   RECOMMENDATION: 1. RIGHT breast ultrasound-guided biopsy x2 2. RIGHT breast ultrasound-guided aspiration with potential conversion to biopsy x1   01/19/2024 Initial Diagnosis   Malignant neoplasm of upper-outer quadrant of right breast in female, estrogen receptor positive (HCC)   01/21/2024 Cancer Staging   Staging form: Breast, AJCC 8th Edition - Clinical stage from 01/21/2024: Stage IA (cT1c, cN0, cM0, G2, ER+, PR-, HER2-) - Signed by [name], MD on 01/21/2024 Stage prefix: Initial diagnosis Histologic grading system: 3 grade system Laterality: Right Staged by: Pathologist and managing physician Stage used in treatment planning: Yes National guidelines used in treatment planning: Yes Type of national guideline used in treatment planning: NCCN Staging comments: This was the 8cmfn     Pathology Results    1. Breast, right, needle core biopsy, 10 o'clock 8cmfn (heart clip) :       - INVASIVE DUCTAL CARCINOMA WITH PAPILLARY FEATURES, SEE NOTE       - DUCTAL CARCINOMA IN SITU, INTERMEDIATE GRADE       - TUBULE FORMATION: SCORE 3       - NUCLEAR PLEOMORPHISM: SCORE 2       - MITOTIC COUNT: SCORE 1       - TOTAL SCORE: 6       -  OVERALL GRADE: 2       - LYMPHOVASCULAR INVASION: NOT IDENTIFIED       - CANCER LENGTH: 0.5 CM       - CALCIFICATIONS: NOT IDENTIFIED       - OTHER FINDINGS: NONE   2. Breast,  right, needle core biopsy, 10 o'clock 7cmfn (ribbon clip) :       - INVASIVE DUCTAL CARCINOMA WITH MUCINOUS FEATURES, SEE NOTE       - DUCTAL CARCINOMA IN SITU, INTERMEDIATE GRADE       - TUBULE FORMATION: SCORE 3       - NUCLEAR PLEOMORPHISM: SCORE 2       - MITOTIC COUNT: SCORE 1       - TOTAL SCORE: 6       - OVERALL GRADE: 2       - LYMPHOVASCULAR INVASION: NOT IDENTIFIED       - CANCER LENGTH: 0.4 CM       - CALCIFICATIONS: NOT IDENTIFIED       - OTHER FINDINGS: NONE   3. Breast, right, needle core biopsy, 12 o'clock 5cmfn (coil clip) :       - FIBROCYSTIC CHANGE AND APOCRINE METAPLASIA       - NO MALIGNANCY IDENTIFIED   ADDENDUM  1A) Breast, right, needle core biopsy, 10 o'clock 8cmfn (heart clip)  PROGNOSTIC INDICATORS   Results:  IMMUNOHISTOCHEMICAL AND MORPHOMETRIC ANALYSIS PERFORMED MANUALLY  The tumor cells are NEGATIVE for Her2 (0).  Estrogen Receptor:  95%, POSITIVE, STRONG STAINING INTENSITY  Progesterone  Receptor:  0%, NEGATIVE  Proliferation Marker Ki67:  5%    IMMUNOHISTOCHEMICAL AND MORPHOMETRIC ANALYSIS PERFORMED MANUALLY The tumor cells are NEGATIVE for Her2 (0). Estrogen Receptor:  100%, POSITIVE, STRONG STAINING INTENSITY Progesterone  Receptor:  20%, POSITIVE, MODERATE STAINING INTENSITY Proliferation Marker Ki67:  10%     Genetic Testing   Ileah Falkenstein had genetic testing done due to her personal history of breast cancer. Genetic Testing Ordered: [facility] CancerNext+RNAinsight (40 genes) Report Date: 01/29/2024 Result: NEGATIVE [facility] + RNAinsight gene panel which includes sequencing, rearrangement analysis, and RNA analysis for the following 40 genes: APC, ATM, BAP1, BARD1, BMPR1A, BRCA1, BRCA2, BRIP1, CDH1, CDKN2A, CHEK2, FH, FLCN, MET, MLH1, MSH2, MSH6, MUTYH, NF1, NTHL1, PALB2, PMS2, PTEN, RAD51C, RAD51D, RPS20, SMAD4, STK11, TP53, TSC1, TSC2, and VHL (sequencing and deletion/duplication); AXIN2, HOXB13, MBD4, MSH3, POLD1 and POLE (sequencing  only); EPCAM and GREM1 (deletion/duplication only).   Malignant neoplasm of overlapping sites of right breast in female, estrogen receptor positive (HCC)  01/19/2024 Initial Diagnosis   Malignant neoplasm of overlapping sites of right breast in female, estrogen receptor positive (HCC)   01/21/2024 Cancer Staging   Staging form: Breast, AJCC 8th Edition - Clinical stage from 01/21/2024: Stage IA (cT1c, cN0, cM0, G2, ER+, PR+, HER2-) - Signed by [name], MD on 01/21/2024 Stage prefix: Initial diagnosis Nuclear grade: G2 Histologic grading system: 3 grade system Laterality: Right Staged by: Pathologist and managing physician Stage used in treatment planning: Yes National guidelines used in treatment planning: Yes Type of national guideline used in treatment planning: NCCN Staging comments: On 7cmfm      Discussed the use of AI scribe software for clinical note transcription with the patient, who gave verbal consent to proceed.  History of Present Illness  Liandra Mendia is a 68 year old female who presents for follow-up after breast cancer surgery.  She underwent surgery for breast cancer, during which a 2.4 cm tumor was successfully removed  with clear margins. No lymph nodes were sampled. Post-surgery, she experienced minimal pain, requiring Tylenol  only once or twice, and reported no significant soreness or swelling.  Following the surgery, she was placed on doxycycline due to redness around the surgical area, which resolved halfway through the antibiotic course. She also experienced a large bruise at the surgical site. She has been wearing a compression bra post-surgery, which she found uncomfortable and had refitted due to tightness.  Her oncotype DX score was low. She has not yet met with the radiation oncologist. She is hesitant about starting anti-estrogen therapy.  All other systems were reviewed with the patient and are negative.  MEDICAL HISTORY:  Past Medical History:   Diagnosis Date   Arthritis 2022   Breast cancer [facility])    Family history of breast cancer    KNEE PAIN 06/26/2010   Qualifier: Diagnosis of  By: Alvan MD, Catherine     Right calcaneal fracture    SHOULDER PAIN, RIGHT 07/09/2010   Qualifier: Diagnosis of  By: Alvan MD, Catherine     Skin cancer 08/2018   Had plastic surgery on side of my nose    SURGICAL HISTORY: Past Surgical History:  Procedure Laterality Date   BREAST BIOPSY Right 01/13/2024   US  RT BREAST BX W LOC DEV 1ST LESION IMG BX SPEC US  GUIDE 01/13/2024 [facility]   BREAST BIOPSY Right 01/13/2024   US  RT BREAST BX W LOC DEV EA ADD LESION IMG BX SPEC US  GUIDE 01/13/2024 [facility]   BREAST BIOPSY Right 01/13/2024   US  RT BREAST BX W LOC DEV EA ADD LESION IMG BX SPEC US  GUIDE 01/13/2024 [facility]   BREAST BIOPSY Right 02/02/2024   US  RT BREAST BX W LOC DEV 1ST LESION IMG BX SPEC US  GUIDE 02/02/2024 [facility]   BREAST BIOPSY  02/24/2024   MM RT RADIOACTIVE SEED EA ADD LESION LOC MAMMO GUIDE 02/24/2024 [facility]   BREAST BIOPSY  02/24/2024   MM RT RADIOACTIVE SEED EA ADD LESION LOC MAMMO GUIDE 02/24/2024 [facility]   BREAST BIOPSY  02/24/2024   MM RT RADIOACTIVE SEED LOC MAMMO GUIDE 02/24/2024 [facility]   BREAST LUMPECTOMY WITH RADIOACTIVE SEED LOCALIZATION Right 02/25/2024   Procedure: BREAST LUMPECTOMY WITH RADIOACTIVE SEED LOCALIZATION;  Surgeon: Aron Shoulders, MD;  Location: [facility];  Service: General;  Laterality: Right;  RIGHT BREAST SEED BRACKETED LUMPECTOMY   COLONOSCOPY     FRACTURE SURGERY  3/22   Broke heal   ORIF CALCANEOUS FRACTURE Right 07/27/2020   Procedure: OPEN REDUCTION INTERNAL FIXATION (ORIF) CALCANEOUS FRACTURE;  Surgeon: Kit Rush, MD;  Location: [facility];  Service: Orthopedics;  Laterality: Right;    RADIOACTIVE SEED GUIDED BREAST BIOPSY Right 02/25/2024   Procedure: RADIOACTIVE SEED GUIDED BREAST BIOPSY;  Surgeon:  Aron Shoulders, MD;  Location: [facility] OR;  Service: General;  Laterality: Right;  RIGHT SEED LOCALIZED EXCISIONAL BIOPSY    SOCIAL HISTORY: Social History   Socioeconomic History   Marital status: Married    Spouse name: Genevive Printup   Number of children: 2   Years of education: 12   Highest education level: Some college, no degree  Occupational History   Occupation: CLINICAL CYTOGENETICIST MANAGE    Employer: [employer]   Occupation: HR Consultant    Comment: Full time  Tobacco Use   Smoking status: Never   Smokeless tobacco: Never  Vaping Use   Vaping status: Never Used  Substance and Sexual Activity  Alcohol use: Yes    Alcohol/week: 3.0 standard drinks of alcohol    Types: 3 Standard drinks or equivalent per week    Comment: social   Drug use: No   Sexual activity: Yes    Partners: Male    Birth control/protection: Post-menopausal  Other Topics Concern   Not on file  Social History Narrative   Lives with her spouse. She has two children. She enjoys being outdoors and going to the beach.   Social Drivers of Corporate Investment Banker Strain: Low Risk  (03/10/2024)   Overall Financial Resource Strain (CARDIA)    Difficulty of Paying Living Expenses: Not hard at all  Food Insecurity: No Food Insecurity (03/10/2024)   Hunger Vital Sign    Worried About Running Out of Food in the Last Year: Never true    Ran Out of Food in the Last Year: Never true  Transportation Needs: No Transportation Needs (03/10/2024)   PRAPARE - Administrator, Civil Service (Medical): No    Lack of Transportation (Non-Medical): No  Physical Activity: Sufficiently Active (03/10/2024)   Exercise Vital Sign    Days of Exercise per Week: 5 days    Minutes of Exercise per Session: 40 min  Stress: No Stress Concern Present (03/10/2024)   [employer] of Occupational Health - Occupational Stress Questionnaire    Feeling of Stress: Not at all  Social Connections: Socially Integrated  (03/10/2024)   Social Connection and Isolation Panel    Frequency of Communication with Friends and Family: More than three times a week    Frequency of Social Gatherings with Friends and Family: More than three times a week    Attends Religious Services: 1 to 4 times per year    Active Member of [employer] or Organizations: Yes    Attends Engineer, Structural: More than 4 times per year    Marital Status: Married  Catering Manager Violence: Not At Risk (01/21/2024)   Humiliation, Afraid, Rape, and Kick questionnaire    Fear of Current or Ex-Partner: No    Emotionally Abused: No    Physically Abused: No    Sexually Abused: No    FAMILY HISTORY: Family History  Problem Relation Age of Onset   Hypertension Mother 36   Arthritis Mother    Heart disease Mother    Heart disease Father 65       quadruple bypass   Skin cancer Father    Skin cancer Brother        Non-Melanoma   Heart disease Maternal Grandfather    Breast cancer Paternal Grandmother 1   Heart disease Paternal Grandfather    Skin cancer Paternal Uncle        Non-Melanoma   Breast cancer Paternal Cousin 67    ALLERGIES:  is allergic to oxycodone , codeine, hydrocodone, metronidazole , tramadol , betadine [povidone iodine], latex, and meloxicam .  MEDICATIONS:  Current Outpatient Medications  Medication Sig Dispense Refill   ibuprofen  (ADVIL ) 800 MG tablet Take 800 mg by mouth as needed for moderate pain (pain score 4-6).     OVER THE COUNTER MEDICATION Take 1 packet by mouth daily. [place] Multivitamin Packets     No current facility-administered medications for this visit.     PHYSICAL EXAMINATION: ECOG PERFORMANCE STATUS: 0 - Asymptomatic  Vitals:   03/24/24 0846  BP: 131/87  Pulse: 66  Resp: 17  Temp: 97.6 F (36.4 C)  SpO2: 98%    Filed Weights   03/24/24 0846  Weight: 192 lb 12.8 oz (87.5 kg)     GENERAL:alert, no distress and comfortable   LABORATORY DATA:  I have reviewed the data  as listed Lab Results  Component Value Date   WBC 5.0 02/20/2024   HGB 12.4 02/20/2024   HCT 38.0 02/20/2024   MCV 93.4 02/20/2024   PLT 262 02/20/2024     Chemistry      Component Value Date/Time   NA 138 01/21/2024 1147   NA 140 03/11/2023 0846   K 4.2 01/21/2024 1147   CL 106 01/21/2024 1147   CO2 26 01/21/2024 1147   BUN 25 (H) 01/21/2024 1147   BUN 16 03/11/2023 0846   CREATININE 1.19 (H) 01/21/2024 1147   CREATININE 1.14 (H) 04/17/2021 0000      Component Value Date/Time   CALCIUM 9.2 01/21/2024 1147   ALKPHOS 52 01/21/2024 1147   AST 16 01/21/2024 1147   ALT 19 01/21/2024 1147   BILITOT 0.5 01/21/2024 1147       RADIOGRAPHIC STUDIES: I have personally reviewed the radiological images as listed and agreed with the findings in the report. 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 Radioactive seed localization was performed yesterday in anticipation of today's lumpectomy. EXAM: SPECIMEN RADIOGRAPH OF THE RIGHT BREAST x 2 COMPARISON:  Previous exam(s). FINDINGS: Status post excision of the RIGHT breast. Images are submitted for interpretation post-operatively. The initial image obtained at 10:12 a.m. contains the ribbon shaped tissue marking clip, the heart shaped tissue marking clip, and the 2 radioactive seeds. The seeds are intact. The second image obtained at 10:26 a.m. contains the barbell shaped tissue marking clip associated with the ADH, the adjacent coil shaped tissue marking clip, and the radioactive seed. The seed is intact. IMPRESSION: Specimen radiographs of the RIGHT breast (x 2). Electronically Signed   By: Debby Satterfield M.D.   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 Radioactive seed localization was performed yesterday in anticipation of today's lumpectomy. EXAM: SPECIMEN RADIOGRAPH OF THE RIGHT BREAST x 2 COMPARISON:  Previous exam(s). FINDINGS: Status post excision of the RIGHT breast. Images are submitted for interpretation post-operatively. The initial image obtained at 10:12 a.m. contains the ribbon shaped tissue marking clip, the heart shaped tissue marking clip, and the 2 radioactive seeds. The seeds are intact. The second image obtained at 10:26 a.m. contains the barbell shaped tissue marking clip associated with the ADH, the adjacent coil shaped tissue marking clip, and the radioactive seed. The seed is intact. IMPRESSION: Specimen radiographs of the RIGHT breast (x 2). Electronically Signed   By: Debby Satterfield M.D.   On: 02/25/2024 11:01   MM RT RADIOACTIVE SEED LOC MAMMO GUIDE Result Date: 02/24/2024 CLINICAL DATA:  68 year old with biopsy-proven multifocal invasive ductal carcinoma and DCIS (ribbon and heart clip) involving the UPPER OUTER QUADRANT of the RIGHT breast and a biopsy-proven papilloma with atypical ductal hyperplasia (barbell clip) in the upper RIGHT breast. Radioactive seed localization is performed in anticipation of lumpectomy which is scheduled  for tomorrow. EXAM: MAMMOGRAPHIC GUIDED RADIOACTIVE SEED LOCALIZATION OF THE RIGHT BREAST x 3 COMPARISON:  Previous exam(s). FINDINGS: Patient presents for radioactive seed localization prior to RIGHT breast lumpectomy. I met with the patient and we discussed the procedure of seed localization including benefits and alternatives. We discussed the high likelihood of a successful procedure. We  discussed the risks of the procedure including infection, bleeding, tissue injury and further surgery. We discussed the low dose of radioactivity involved in the procedure. Informed, written consent was given. The usual time-out protocol was performed immediately prior to the procedure. Using mammographic guidance, sterile technique sterile technique with chlorhexidine  as skin antisepsis, 1% lidocaine  as local anesthetic, I-125 radioactive seeds were used to localize the ribbon shaped and heart shaped tissue marking clips associated with the IDC/DCIS and the barbell shaped tissue marking clip associated with the papilloma containing ADH using a lateral approach. The follow-up mammogram images confirm that the seeds are in the expected location; the seeds are immediately adjacent to the ribbon clip and heart clip, and the seed is approximately 0.5 cm inferomedial to the barbell clip. Of note, the coil shaped tissue marking clip associated with benign fibrocystic changes and apocrine metaplasia is adjacent to the barbell clip. The images are marked for Dr. Aron. Follow-up survey of the patient confirms the presence of the radioactive seeds. #1) IDC, ribbon clip: Order number of I-125 seed: [id number] Total activity: 0.253 mCi Reference Date: 02/09/2024 #2) IDC, heart clip: Order number of I-125 seed: [id number] Total activity: 0.253 mCi Reference Date: 02/09/2024 #3) ADH, barbell clip: Order number of I-125 seed: [id number] Total activity: 0.236 mCi Reference Date: 10/09/2023 The patient tolerated the procedure well without apparent immediate complications. She was released from the [facility] with instructions regarding seed removal. 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 Electronically Signed   By: Debby Satterfield M.D.   On: 02/24/2024 09:16   MM RT RADIO SEED EA ADD LESION LOC MAMMO Result Date:  02/24/2024 CLINICAL DATA:  68 year old with biopsy-proven multifocal invasive ductal carcinoma and DCIS (ribbon and heart clip) involving the UPPER OUTER QUADRANT of the RIGHT breast and a biopsy-proven papilloma with atypical ductal hyperplasia (barbell clip) in the upper RIGHT breast. Radioactive seed localization is performed in anticipation of lumpectomy which is scheduled for tomorrow. EXAM: MAMMOGRAPHIC GUIDED RADIOACTIVE SEED LOCALIZATION OF THE RIGHT BREAST x 3 COMPARISON:  Previous exam(s). FINDINGS: Patient presents for radioactive seed localization prior to RIGHT breast lumpectomy. I met with the patient and we discussed the procedure of seed localization including benefits and alternatives. We discussed the high likelihood of a successful procedure. We discussed the risks of the procedure including infection, bleeding, tissue injury and further surgery. We discussed the low dose of radioactivity involved in the procedure. Informed, written consent was given. The usual time-out protocol was performed immediately prior to the procedure. Using mammographic guidance, sterile technique sterile technique with chlorhexidine  as skin antisepsis, 1% lidocaine  as local anesthetic, I-125 radioactive seeds were used to localize the ribbon shaped and heart shaped tissue marking clips associated with the IDC/DCIS and the barbell shaped tissue marking clip associated with the papilloma containing ADH using a lateral approach. The follow-up mammogram images confirm that the seeds are in the expected location; the seeds are immediately adjacent to the ribbon clip and heart clip, and the seed is approximately 0.5 cm inferomedial to the barbell clip. Of note, the coil shaped tissue marking clip  associated with benign fibrocystic changes and apocrine metaplasia is adjacent to the barbell clip. The images are marked for Dr. Aron. Follow-up survey of the patient confirms the presence of the radioactive seeds. #1) IDC,  ribbon clip: Order number of I-125 seed: [id number] Total activity: 0.253 mCi Reference Date: 02/09/2024 #2) IDC, heart clip: Order number of I-125 seed: [id number] Total activity: 0.253 mCi Reference Date: 02/09/2024 #3) ADH, barbell clip: Order number of I-125 seed: [id number] Total activity: 0.236 mCi Reference Date: 10/09/2023 The patient tolerated the procedure well without apparent immediate complications. She was released from the Breast Center with instructions regarding seed removal. 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 Electronically Signed   By: Debby Satterfield M.D.   On: 02/24/2024 09:16   MM RT RADIO SEED EA ADD LESION LOC MAMMO Result Date: 02/24/2024 CLINICAL DATA:  68 year old with biopsy-proven multifocal invasive ductal carcinoma and DCIS (ribbon and heart clip) involving the UPPER OUTER QUADRANT of the RIGHT breast and a biopsy-proven papilloma with atypical ductal hyperplasia (barbell clip) in the upper RIGHT breast. Radioactive seed localization is performed in anticipation of lumpectomy which is scheduled for tomorrow. EXAM: MAMMOGRAPHIC GUIDED RADIOACTIVE SEED LOCALIZATION OF THE RIGHT BREAST x 3 COMPARISON:  Previous exam(s). FINDINGS: Patient presents for radioactive seed localization prior to RIGHT breast lumpectomy. I met with the patient and we discussed the procedure of seed localization including benefits and alternatives. We discussed the high likelihood of a successful procedure. We discussed the risks of the procedure including infection, bleeding, tissue injury and further surgery. We discussed the low dose of radioactivity involved in the procedure. Informed, written consent was given. The usual time-out protocol was performed immediately prior to the procedure. Using mammographic guidance, sterile technique sterile technique with chlorhexidine  as skin antisepsis, 1% lidocaine  as  local anesthetic, I-125 radioactive seeds were used to localize the ribbon shaped and heart shaped tissue marking clips associated with the IDC/DCIS and the barbell shaped tissue marking clip associated with the papilloma containing ADH using a lateral approach. The follow-up mammogram images confirm that the seeds are in the expected location; the seeds are immediately adjacent to the ribbon clip and heart clip, and the seed is approximately 0.5 cm inferomedial to the barbell clip. Of note, the coil shaped tissue marking clip associated with benign fibrocystic changes and apocrine metaplasia is adjacent to the barbell clip. The images are marked for Dr. Aron. Follow-up survey of the patient confirms the presence of the radioactive seeds. #1) IDC, ribbon clip: Order number of I-125 seed: [id number] Total activity: 0.253 mCi Reference Date: 02/09/2024 #2) IDC, heart clip: Order number of I-125 seed: [id number] Total activity: 0.253 mCi Reference Date: 02/09/2024 #3) ADH, barbell clip: Order number of [id number] seed: [id number] Total activity: 0.236 mCi Reference Date: 10/09/2023 The patient tolerated the procedure well without apparent immediate complications. [facility] with instructions regarding seed removal. 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 Electronically Signed   By: Debby Satterfield M.D.   On: 02/24/2024 09:16    All questions were answered. The patient knows to call the clinic with any problems, questions or concerns. I spent 30 minutes in the care of this patient including H and P, review of records, counseling and coordination of care.     Amber Stalls, MD 03/24/2024 9:03 AM

## 2024-03-31 NOTE — Progress Notes (Incomplete)
  Radiation Oncology         [phone] ________________________________  Name: Kristine Bowman MRN: [id number]  Date: 04/01/2024  DOB: 09-26-1955  Follow-Up Visit Note  CC: Kristine Dorothyann BIRCH, MD  [name], MD  No diagnosis found.  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 She was last seen in office on 03/24/24 for a consultation visit  with PA [facility]. To recall, we previously discussed the role and side effects of undergoing radiotherapy. Patient was undecided during our consultation and is here today to further discuss her decision.    No other significant oncologic interval history since the patient was last seen.                               Allergies:  is allergic to oxycodone , codeine, hydrocodone, metronidazole , tramadol , betadine [povidone iodine], latex, and meloxicam .  Meds: Current Outpatient Medications  Medication Sig Dispense Refill   ibuprofen  (ADVIL ) 800 MG tablet Take 800 mg by mouth as needed for moderate pain (pain score 4-6).     OVER THE COUNTER MEDICATION Take 1 packet by mouth daily. Shacklee Multivitamin Packets     No current facility-administered medications for this visit.    Physical Findings: The patient is in no acute distress. Patient is alert and oriented.  vitals were not taken for this visit. .  No significant changes. Lungs are clear to auscultation bilaterally. Heart has regular rate and rhythm. No palpable cervical, supraclavicular, or axillary adenopathy. Abdomen soft, non-tender, normal bowel sounds.   Lab Findings: Lab Results  Component Value Date   WBC 5.0 02/20/2024   HGB 12.4 02/20/2024   HCT 38.0 02/20/2024   MCV 93.4 02/20/2024   PLT 262 02/20/2024    Radiographic Findings: No results found.  Impression:   Stage IA (cT1c, N0, M0) Multifocal Invasive Ductal Carcinoma of  the right breast ER+ / PR+ / Her2-, Grade 2   The patient is recovering from the effects of radiation.  ***  Plan:  ***   *** minutes of total time was spent for this patient encounter, including preparation, face-to-face counseling with the patient and coordination of care, physical exam, and documentation of the encounter. ____________________________________  Lynwood CHARM Nasuti, PhD, MD  This document serves as a record of services personally performed by Lynwood Nasuti, MD. It was created on his behalf by Reymundo Cartwright, a trained medical scribe. The creation of this record is based on the scribe's personal observations and the provider's statements to them. This document has been checked and approved by the attending provider.

## 2024-04-01 ENCOUNTER — Encounter: Payer: Self-pay | Admitting: *Deleted

## 2024-04-01 ENCOUNTER — Ambulatory Visit
Admission: RE | Admit: 2024-04-01 | Discharge: 2024-04-01 | Attending: Radiation Oncology | Admitting: Radiation Oncology

## 2024-04-01 DIAGNOSIS — C50811 Malignant neoplasm of overlapping sites of right female breast: Secondary | ICD-10-CM

## 2024-04-01 NOTE — Progress Notes (Signed)
 Called patient to re-discuss Dr. Colton recommendations of adjuvant radiation. All questions were answered and she is agreeable to proceeding with treatment at this time. Dr. Shannon anticipates 21 fractions directed at the right breast. We will get her scheduled for CT simulation late December, per patient preference.     Leeroy Due, PA-C

## 2024-04-07 ENCOUNTER — Encounter: Payer: Self-pay | Admitting: *Deleted

## 2024-04-15 ENCOUNTER — Ambulatory Visit: Admitting: Radiation Oncology

## 2024-04-19 ENCOUNTER — Ambulatory Visit
Admission: RE | Admit: 2024-04-19 | Discharge: 2024-04-19 | Disposition: A | Discharge status: Home / Self Care | Source: Ambulatory Visit | Attending: Hematology and Oncology | Admitting: Hematology and Oncology

## 2024-04-19 DIAGNOSIS — C50811 Malignant neoplasm of overlapping sites of right female breast: Secondary | ICD-10-CM | POA: Insufficient documentation

## 2024-04-19 DIAGNOSIS — C50411 Malignant neoplasm of upper-outer quadrant of right female breast: Secondary | ICD-10-CM | POA: Insufficient documentation

## 2024-04-19 DIAGNOSIS — Z170 Estrogen receptor positive status [ER+]: Secondary | ICD-10-CM | POA: Insufficient documentation

## 2024-04-21 DIAGNOSIS — C50811 Malignant neoplasm of overlapping sites of right female breast: Secondary | ICD-10-CM | POA: Diagnosis not present

## 2024-04-26 ENCOUNTER — Ambulatory Visit
Admission: RE | Admit: 2024-04-26 | Discharge: 2024-04-26 | Disposition: A | Discharge status: Home / Self Care | Source: Ambulatory Visit | Attending: Radiation Oncology | Admitting: Radiation Oncology

## 2024-04-26 ENCOUNTER — Other Ambulatory Visit: Payer: Self-pay

## 2024-04-26 DIAGNOSIS — C50811 Malignant neoplasm of overlapping sites of right female breast: Secondary | ICD-10-CM | POA: Diagnosis not present

## 2024-04-26 DIAGNOSIS — Z170 Estrogen receptor positive status [ER+]: Secondary | ICD-10-CM

## 2024-04-26 LAB — RAD ONC ARIA SESSION SUMMARY
Course Elapsed Days: 0
Plan Fractions Treated to Date: 1
Plan Prescribed Dose Per Fraction: 2.67 Gy
Plan Total Fractions Prescribed: 15
Plan Total Prescribed Dose: 40.05 Gy
Reference Point Dosage Given to Date: 2.67 Gy
Reference Point Session Dosage Given: 2.67 Gy
Session Number: 1

## 2024-04-27 ENCOUNTER — Ambulatory Visit
Admission: RE | Admit: 2024-04-27 | Discharge: 2024-04-27 | Disposition: A | Discharge status: Home / Self Care | Source: Ambulatory Visit | Attending: Radiation Oncology | Admitting: Radiation Oncology

## 2024-04-27 ENCOUNTER — Other Ambulatory Visit: Payer: Self-pay

## 2024-04-27 DIAGNOSIS — C50411 Malignant neoplasm of upper-outer quadrant of right female breast: Secondary | ICD-10-CM

## 2024-04-27 DIAGNOSIS — C50811 Malignant neoplasm of overlapping sites of right female breast: Secondary | ICD-10-CM | POA: Diagnosis not present

## 2024-04-27 LAB — RAD ONC ARIA SESSION SUMMARY
Course Elapsed Days: 1
Plan Fractions Treated to Date: 2
Plan Prescribed Dose Per Fraction: 2.67 Gy
Plan Total Fractions Prescribed: 15
Plan Total Prescribed Dose: 40.05 Gy
Reference Point Dosage Given to Date: 5.34 Gy
Reference Point Session Dosage Given: 2.67 Gy
Session Number: 2

## 2024-04-27 MED ORDER — ALRA NON-METALLIC DEODORANT (RAD-ONC)
1.0000 | Freq: Once | TOPICAL | Status: AC
  Administered 2024-04-27: 1 via TOPICAL

## 2024-04-27 MED ORDER — RADIAPLEXRX EX GEL: Freq: Once | CUTANEOUS | Status: AC

## 2024-04-27 NOTE — Progress Notes (Signed)
 Pt here for patient teaching. Pt given Radiation and You booklet, skin care instructions, Alra deodorant, and Radiaplex gel. Reviewed areas of pertinence such as fatigue, hair loss, skin changes, breast tenderness, and breast swelling. Pt able to give teach back of to pat skin, use unscented/gentle soap, and drink plenty of water,apply Radiaplex bid, avoid applying anything to skin within 4 hours of treatment, avoid wearing an under wire bra, and to use an electric razor if they must shave. Pt verbalizes understanding of information given and will contact nursing with any questions or concerns.

## 2024-04-28 ENCOUNTER — Ambulatory Visit
Admission: RE | Admit: 2024-04-28 | Discharge: 2024-04-28 | Disposition: A | Discharge status: Home / Self Care | Source: Ambulatory Visit | Attending: Radiation Oncology | Admitting: Radiation Oncology

## 2024-04-28 ENCOUNTER — Other Ambulatory Visit: Payer: Self-pay

## 2024-04-28 DIAGNOSIS — C50811 Malignant neoplasm of overlapping sites of right female breast: Secondary | ICD-10-CM | POA: Diagnosis not present

## 2024-04-28 LAB — RAD ONC ARIA SESSION SUMMARY
Course Elapsed Days: 2
Plan Fractions Treated to Date: 3
Plan Prescribed Dose Per Fraction: 2.67 Gy
Plan Total Fractions Prescribed: 15
Plan Total Prescribed Dose: 40.05 Gy
Reference Point Dosage Given to Date: 8.01 Gy
Reference Point Session Dosage Given: 2.67 Gy
Session Number: 3

## 2024-04-30 ENCOUNTER — Other Ambulatory Visit: Payer: Self-pay

## 2024-04-30 ENCOUNTER — Ambulatory Visit
Admission: RE | Admit: 2024-04-30 | Discharge: 2024-04-30 | Disposition: A | Discharge status: Home / Self Care | Source: Ambulatory Visit | Attending: Hematology and Oncology | Admitting: Hematology and Oncology

## 2024-04-30 DIAGNOSIS — C50411 Malignant neoplasm of upper-outer quadrant of right female breast: Secondary | ICD-10-CM | POA: Insufficient documentation

## 2024-04-30 DIAGNOSIS — Z170 Estrogen receptor positive status [ER+]: Secondary | ICD-10-CM | POA: Insufficient documentation

## 2024-04-30 DIAGNOSIS — C50811 Malignant neoplasm of overlapping sites of right female breast: Secondary | ICD-10-CM | POA: Insufficient documentation

## 2024-04-30 LAB — RAD ONC ARIA SESSION SUMMARY
Course Elapsed Days: 4
Plan Fractions Treated to Date: 4
Plan Prescribed Dose Per Fraction: 2.67 Gy
Plan Total Fractions Prescribed: 15
Plan Total Prescribed Dose: 40.05 Gy
Reference Point Dosage Given to Date: 10.68 Gy
Reference Point Session Dosage Given: 2.67 Gy
Session Number: 4

## 2024-05-03 ENCOUNTER — Ambulatory Visit: Admitting: Medical-Surgical

## 2024-05-03 ENCOUNTER — Other Ambulatory Visit: Payer: Self-pay

## 2024-05-03 ENCOUNTER — Ambulatory Visit
Admission: RE | Admit: 2024-05-03 | Discharge: 2024-05-03 | Disposition: A | Discharge status: Home / Self Care | Source: Ambulatory Visit | Attending: Radiation Oncology | Admitting: Radiation Oncology

## 2024-05-03 ENCOUNTER — Encounter: Payer: Self-pay | Admitting: Medical-Surgical

## 2024-05-03 VITALS — BP 122/77 | HR 71 | Temp 97.7°F | Resp 20 | Ht 66.0 in | Wt 193.6 lb

## 2024-05-03 DIAGNOSIS — N9489 Other specified conditions associated with female genital organs and menstrual cycle: Secondary | ICD-10-CM

## 2024-05-03 LAB — RAD ONC ARIA SESSION SUMMARY
Course Elapsed Days: 7
Plan Fractions Treated to Date: 5
Plan Prescribed Dose Per Fraction: 2.67 Gy
Plan Total Fractions Prescribed: 15
Plan Total Prescribed Dose: 40.05 Gy
Reference Point Dosage Given to Date: 13.35 Gy
Reference Point Session Dosage Given: 2.67 Gy
Session Number: 5

## 2024-05-03 LAB — POCT URINALYSIS DIP (CLINITEK)
Bilirubin, UA: NEGATIVE
Blood, UA: NEGATIVE
Glucose, UA: NEGATIVE mg/dL
Ketones, POC UA: NEGATIVE mg/dL
Nitrite, UA: NEGATIVE
POC PROTEIN,UA: NEGATIVE
Spec Grav, UA: 1.02
Urobilinogen, UA: 0.2 U/dL
pH, UA: 6

## 2024-05-03 NOTE — Progress Notes (Signed)
" ° °       Established patient visit  History, exam, impression, and plan:  1. Vaginal burning (Primary) Pleasant 69 year old female presenting today with complaints of approximately 4-5 days of vaginal burning.  Notes that the burning does not occur while she is urinating but does present in between.  Describes it as a dry burning sensation.  No vaginal discharge or itching.  No suprapubic pain/pressure.  No fever, chills, nausea, or back pain.  Currently taking a probiotic daily.  Has not been sexually active recently and denies risk for STIs.  Admits to taking bubble baths and using Epsom salt soaks a couple times a week.  History of similar symptoms found to be bacterial vaginosis.  Point-of-care urinalysis shows trace leukocytes, otherwise normal.  Sending for culture.  Wet prep to evaluate for bacterial vaginosis and yeast.  Discussed possibility of vaginal atrophy in the setting of postmenopausal changes.  Currently undergoing chemotherapy/radiation for breast cancer.  As her breast cancer is estrogen receptor positive, we would need to avoid Estrace /Premarin.  If all testing comes back negative would recommend a trial of Replens.  Physical Exam Vitals reviewed.  Constitutional:      General: She is not in acute distress.    Appearance: Normal appearance.  HENT:     Head: Normocephalic and atraumatic.  Cardiovascular:     Rate and Rhythm: Normal rate and regular rhythm.  Pulmonary:     Effort: Pulmonary effort is normal. No respiratory distress.  Skin:    General: Skin is warm and dry.  Neurological:     Mental Status: She is alert and oriented to person, place, and time.  Psychiatric:        Mood and Affect: Mood normal.        Behavior: Behavior normal.        Thought Content: Thought content normal.        Judgment: Judgment normal.    Procedures performed this visit: None.  Return if symptoms worsen or fail to improve.  __________________________________ Zada FREDRIK Palin,  DNP, APRN, FNP-BC Primary Care and Sports Medicine [facility] [place] "

## 2024-05-03 NOTE — Addendum Note (Signed)
 Addended by: FANNY NIELS CROME on: 05/03/2024 11:48 AM   Modules accepted: Orders

## 2024-05-04 ENCOUNTER — Ambulatory Visit
Admission: RE | Admit: 2024-05-04 | Discharge: 2024-05-04 | Disposition: A | Discharge status: Home / Self Care | Source: Ambulatory Visit | Attending: Radiation Oncology | Admitting: Radiation Oncology

## 2024-05-04 ENCOUNTER — Ambulatory Visit
Admission: RE | Admit: 2024-05-04 | Discharge: 2024-05-04 | Disposition: A | Source: Ambulatory Visit | Attending: Radiation Oncology | Admitting: Radiation Oncology

## 2024-05-04 ENCOUNTER — Other Ambulatory Visit: Payer: Self-pay

## 2024-05-04 ENCOUNTER — Ambulatory Visit: Payer: Self-pay | Admitting: Medical-Surgical

## 2024-05-04 LAB — RAD ONC ARIA SESSION SUMMARY
Course Elapsed Days: 8
Plan Fractions Treated to Date: 6
Plan Prescribed Dose Per Fraction: 2.67 Gy
Plan Total Fractions Prescribed: 15
Plan Total Prescribed Dose: 40.05 Gy
Reference Point Dosage Given to Date: 16.02 Gy
Reference Point Session Dosage Given: 2.67 Gy
Session Number: 6

## 2024-05-04 LAB — WET PREP FOR TRICH, YEAST, CLUE
Clue Cell Exam: NEGATIVE
Trichomonas Exam: NEGATIVE
Yeast Exam: NEGATIVE

## 2024-05-05 ENCOUNTER — Other Ambulatory Visit: Payer: Self-pay

## 2024-05-05 ENCOUNTER — Encounter: Payer: Self-pay | Admitting: Medical-Surgical

## 2024-05-05 ENCOUNTER — Ambulatory Visit
Admission: RE | Admit: 2024-05-05 | Discharge: 2024-05-05 | Disposition: A | Discharge status: Home / Self Care | Source: Ambulatory Visit | Attending: Radiation Oncology | Admitting: Radiation Oncology

## 2024-05-05 LAB — RAD ONC ARIA SESSION SUMMARY
Course Elapsed Days: 9
Plan Fractions Treated to Date: 7
Plan Prescribed Dose Per Fraction: 2.67 Gy
Plan Total Fractions Prescribed: 15
Plan Total Prescribed Dose: 40.05 Gy
Reference Point Dosage Given to Date: 18.69 Gy
Reference Point Session Dosage Given: 2.67 Gy
Session Number: 7

## 2024-05-05 LAB — URINE CULTURE

## 2024-05-06 ENCOUNTER — Ambulatory Visit
Admission: RE | Admit: 2024-05-06 | Discharge: 2024-05-06 | Disposition: A | Discharge status: Home / Self Care | Source: Ambulatory Visit | Attending: Radiation Oncology | Admitting: Radiation Oncology

## 2024-05-06 ENCOUNTER — Other Ambulatory Visit: Payer: Self-pay

## 2024-05-06 LAB — RAD ONC ARIA SESSION SUMMARY
Course Elapsed Days: 10
Plan Fractions Treated to Date: 8
Plan Prescribed Dose Per Fraction: 2.67 Gy
Plan Total Fractions Prescribed: 15
Plan Total Prescribed Dose: 40.05 Gy
Reference Point Dosage Given to Date: 21.36 Gy
Reference Point Session Dosage Given: 2.67 Gy
Session Number: 8

## 2024-05-07 ENCOUNTER — Encounter: Payer: Self-pay | Admitting: *Deleted

## 2024-05-07 ENCOUNTER — Other Ambulatory Visit: Payer: Self-pay

## 2024-05-07 ENCOUNTER — Ambulatory Visit
Admission: RE | Admit: 2024-05-07 | Discharge: 2024-05-07 | Disposition: A | Source: Ambulatory Visit | Attending: Radiation Oncology | Admitting: Radiation Oncology

## 2024-05-07 DIAGNOSIS — Z170 Estrogen receptor positive status [ER+]: Secondary | ICD-10-CM

## 2024-05-07 LAB — RAD ONC ARIA SESSION SUMMARY
Course Elapsed Days: 11
Plan Fractions Treated to Date: 9
Plan Prescribed Dose Per Fraction: 2.67 Gy
Plan Total Fractions Prescribed: 15
Plan Total Prescribed Dose: 40.05 Gy
Reference Point Dosage Given to Date: 24.03 Gy
Reference Point Session Dosage Given: 2.67 Gy
Session Number: 9

## 2024-05-10 ENCOUNTER — Ambulatory Visit
Admission: RE | Admit: 2024-05-10 | Discharge: 2024-05-10 | Disposition: A | Source: Ambulatory Visit | Attending: Radiation Oncology | Admitting: Radiation Oncology

## 2024-05-10 ENCOUNTER — Other Ambulatory Visit: Payer: Self-pay

## 2024-05-10 LAB — RAD ONC ARIA SESSION SUMMARY
Course Elapsed Days: 14
Plan Fractions Treated to Date: 10
Plan Prescribed Dose Per Fraction: 2.67 Gy
Plan Total Fractions Prescribed: 15
Plan Total Prescribed Dose: 40.05 Gy
Reference Point Dosage Given to Date: 26.7 Gy
Reference Point Session Dosage Given: 2.67 Gy
Session Number: 10

## 2024-05-11 ENCOUNTER — Ambulatory Visit
Admission: RE | Admit: 2024-05-11 | Discharge: 2024-05-11 | Disposition: A | Source: Ambulatory Visit | Attending: Radiation Oncology | Admitting: Radiation Oncology

## 2024-05-11 ENCOUNTER — Ambulatory Visit: Admitting: Radiation Oncology

## 2024-05-11 ENCOUNTER — Other Ambulatory Visit: Payer: Self-pay

## 2024-05-11 LAB — RAD ONC ARIA SESSION SUMMARY
Course Elapsed Days: 15
Plan Fractions Treated to Date: 11
Plan Prescribed Dose Per Fraction: 2.67 Gy
Plan Total Fractions Prescribed: 15
Plan Total Prescribed Dose: 40.05 Gy
Reference Point Dosage Given to Date: 29.37 Gy
Reference Point Session Dosage Given: 2.67 Gy
Session Number: 11

## 2024-05-12 ENCOUNTER — Other Ambulatory Visit: Payer: Self-pay

## 2024-05-12 ENCOUNTER — Ambulatory Visit
Admission: RE | Admit: 2024-05-12 | Discharge: 2024-05-12 | Disposition: A | Discharge status: Home / Self Care | Source: Ambulatory Visit | Attending: Radiation Oncology | Admitting: Radiation Oncology

## 2024-05-12 LAB — RAD ONC ARIA SESSION SUMMARY
Course Elapsed Days: 16
Plan Fractions Treated to Date: 12
Plan Prescribed Dose Per Fraction: 2.67 Gy
Plan Total Fractions Prescribed: 15
Plan Total Prescribed Dose: 40.05 Gy
Reference Point Dosage Given to Date: 32.04 Gy
Reference Point Session Dosage Given: 2.67 Gy
Session Number: 12

## 2024-05-13 ENCOUNTER — Other Ambulatory Visit: Payer: Self-pay

## 2024-05-13 ENCOUNTER — Ambulatory Visit
Admission: RE | Admit: 2024-05-13 | Discharge: 2024-05-13 | Disposition: A | Source: Ambulatory Visit | Attending: Radiation Oncology | Admitting: Radiation Oncology

## 2024-05-13 LAB — RAD ONC ARIA SESSION SUMMARY
Course Elapsed Days: 17
Plan Fractions Treated to Date: 13
Plan Prescribed Dose Per Fraction: 2.67 Gy
Plan Total Fractions Prescribed: 15
Plan Total Prescribed Dose: 40.05 Gy
Reference Point Dosage Given to Date: 34.71 Gy
Reference Point Session Dosage Given: 2.67 Gy
Session Number: 13

## 2024-05-14 ENCOUNTER — Other Ambulatory Visit: Payer: Self-pay

## 2024-05-14 ENCOUNTER — Ambulatory Visit
Admission: RE | Admit: 2024-05-14 | Discharge: 2024-05-14 | Disposition: A | Discharge status: Home / Self Care | Source: Ambulatory Visit | Attending: Radiation Oncology | Admitting: Radiation Oncology

## 2024-05-14 LAB — RAD ONC ARIA SESSION SUMMARY
Course Elapsed Days: 18
Plan Fractions Treated to Date: 14
Plan Prescribed Dose Per Fraction: 2.67 Gy
Plan Total Fractions Prescribed: 15
Plan Total Prescribed Dose: 40.05 Gy
Reference Point Dosage Given to Date: 37.38 Gy
Reference Point Session Dosage Given: 2.67 Gy
Session Number: 14

## 2024-05-17 ENCOUNTER — Ambulatory Visit
Admission: RE | Admit: 2024-05-17 | Discharge: 2024-05-17 | Disposition: A | Source: Ambulatory Visit | Attending: Radiation Oncology | Admitting: Radiation Oncology

## 2024-05-17 ENCOUNTER — Other Ambulatory Visit: Payer: Self-pay

## 2024-05-17 LAB — RAD ONC ARIA SESSION SUMMARY
Course Elapsed Days: 21
Plan Fractions Treated to Date: 15
Plan Prescribed Dose Per Fraction: 2.67 Gy
Plan Total Fractions Prescribed: 15
Plan Total Prescribed Dose: 40.05 Gy
Reference Point Dosage Given to Date: 40.05 Gy
Reference Point Session Dosage Given: 2.67 Gy
Session Number: 15

## 2024-05-18 ENCOUNTER — Other Ambulatory Visit: Payer: Self-pay

## 2024-05-18 ENCOUNTER — Ambulatory Visit
Admission: RE | Admit: 2024-05-18 | Discharge: 2024-05-18 | Disposition: A | Source: Ambulatory Visit | Attending: Radiation Oncology | Admitting: Radiation Oncology

## 2024-05-18 LAB — RAD ONC ARIA SESSION SUMMARY
Course Elapsed Days: 22
Plan Fractions Treated to Date: 1
Plan Prescribed Dose Per Fraction: 2 Gy
Plan Total Fractions Prescribed: 6
Plan Total Prescribed Dose: 12 Gy
Reference Point Dosage Given to Date: 2 Gy
Reference Point Session Dosage Given: 2 Gy
Session Number: 16

## 2024-05-19 ENCOUNTER — Other Ambulatory Visit: Payer: Self-pay

## 2024-05-19 ENCOUNTER — Ambulatory Visit
Admission: RE | Admit: 2024-05-19 | Discharge: 2024-05-19 | Disposition: A | Source: Ambulatory Visit | Attending: Radiation Oncology | Admitting: Radiation Oncology

## 2024-05-19 LAB — RAD ONC ARIA SESSION SUMMARY
Course Elapsed Days: 23
Plan Fractions Treated to Date: 2
Plan Prescribed Dose Per Fraction: 2 Gy
Plan Total Fractions Prescribed: 6
Plan Total Prescribed Dose: 12 Gy
Reference Point Dosage Given to Date: 4 Gy
Reference Point Session Dosage Given: 2 Gy
Session Number: 17

## 2024-05-20 ENCOUNTER — Ambulatory Visit
Admission: RE | Admit: 2024-05-20 | Discharge: 2024-05-20 | Disposition: A | Discharge status: Home / Self Care | Source: Ambulatory Visit | Attending: Radiation Oncology | Admitting: Radiation Oncology

## 2024-05-20 ENCOUNTER — Other Ambulatory Visit: Payer: Self-pay

## 2024-05-20 LAB — RAD ONC ARIA SESSION SUMMARY
Course Elapsed Days: 24
Plan Fractions Treated to Date: 3
Plan Prescribed Dose Per Fraction: 2 Gy
Plan Total Fractions Prescribed: 6
Plan Total Prescribed Dose: 12 Gy
Reference Point Dosage Given to Date: 6 Gy
Reference Point Session Dosage Given: 2 Gy
Session Number: 18

## 2024-05-21 ENCOUNTER — Ambulatory Visit
Admission: RE | Admit: 2024-05-21 | Discharge: 2024-05-21 | Disposition: A | Source: Ambulatory Visit | Attending: Radiation Oncology | Admitting: Radiation Oncology

## 2024-05-21 ENCOUNTER — Other Ambulatory Visit: Payer: Self-pay

## 2024-05-21 ENCOUNTER — Inpatient Hospital Stay: Admitting: Hematology and Oncology

## 2024-05-21 VITALS — BP 126/84 | HR 71 | Temp 98.0°F | Resp 16 | Wt 192.6 lb

## 2024-05-21 DIAGNOSIS — C50411 Malignant neoplasm of upper-outer quadrant of right female breast: Secondary | ICD-10-CM | POA: Insufficient documentation

## 2024-05-21 DIAGNOSIS — Z170 Estrogen receptor positive status [ER+]: Secondary | ICD-10-CM | POA: Insufficient documentation

## 2024-05-21 DIAGNOSIS — Z803 Family history of malignant neoplasm of breast: Secondary | ICD-10-CM | POA: Insufficient documentation

## 2024-05-21 DIAGNOSIS — L598 Other specified disorders of the skin and subcutaneous tissue related to radiation: Secondary | ICD-10-CM | POA: Insufficient documentation

## 2024-05-21 DIAGNOSIS — Z808 Family history of malignant neoplasm of other organs or systems: Secondary | ICD-10-CM | POA: Insufficient documentation

## 2024-05-21 DIAGNOSIS — Y842 Radiological procedure and radiotherapy as the cause of abnormal reaction of the patient, or of later complication, without mention of misadventure at the time of the procedure: Secondary | ICD-10-CM | POA: Insufficient documentation

## 2024-05-21 LAB — RAD ONC ARIA SESSION SUMMARY
Course Elapsed Days: 25
Plan Fractions Treated to Date: 4
Plan Prescribed Dose Per Fraction: 2 Gy
Plan Total Fractions Prescribed: 6
Plan Total Prescribed Dose: 12 Gy
Reference Point Dosage Given to Date: 8 Gy
Reference Point Session Dosage Given: 2 Gy
Session Number: 19

## 2024-05-21 NOTE — Progress Notes (Signed)
 Orders placed for Guardant reveal to be completed for home draws.

## 2024-05-21 NOTE — Progress Notes (Signed)
 [facility] CONSULT NOTE  Patient Care Team: [name], MD as PCP - General (Family Medicine) Gerome, Devere HERO, RN as Oncology Nurse Navigator Tyree Nanetta SAILOR, RN as Oncology Nurse Navigator Aron Shoulders, MD as Consulting Physician (General Surgery) [name], MD as Consulting Physician (Hematology and Oncology) Shannon Agent, MD as Consulting Physician (Radiation Oncology)  CHIEF COMPLAINTS/PURPOSE OF CONSULTATION:  New diagnosis of breast cancer  ASSESSMENT & PLAN:   Assessment and Plan Assessment & Plan  Breast cancer, post-treatment surveillance She is in the surveillance phase post-treatment, nearing completion of radiation therapy, asymptomatic, and without recurrence. Declined adjuvant endocrine therapy. Discussed MRD testing for non-invasive surveillance. - Recommended MRD testing (Guardant Reveal) every six months to detect circulating tumor DNA; provided educational materials and instructions regarding insurance and mobile phlebotomy. - Recommended annual 3D diagnostic mammograms for at least two years, with reassessment thereafter. - Advised annual breast exams for at least five years, to be performed in clinic or by her breast surgeon [facility]). - Arranged annual follow-up appointments for ongoing surveillance for five years. - Advised continuation of routine blood work as ordered by her primary care provider; no additional oncology-specific blood work required. - Instructed her to report any new symptoms or concerns.  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 - Advised monitoring of the affected area and to report any worsening symptoms. - Provided reassurance regarding the mild nature of her symptoms.  Thank you for consulting us  in the care of this patient.  Please do not hesitate to contact us  with any additional questions   HISTORY OF PRESENTING  ILLNESS:  Kristine Bowman 69 y.o. female is here because of new diagnosis of breast cancer  Oncology History  Malignant neoplasm of upper-outer quadrant of right breast in female, estrogen receptor positive (HCC)  01/06/2024 Mammogram   IMPRESSION: 1. There is an indeterminate 6 mm mass at the site of screening mammographic concern. Recommend ultrasound-guided biopsy for definitive characterization. 2. There is a 12 mm sonographically identified mass in the RIGHT upper outer breast. Recommend ultrasound-guided biopsy for definitive characterization. 3. There is an additional sonographically identified 6 mm round mass which may reflect a complicated cyst at 12 o'clock. Recommend ultrasound-guided aspiration with potential conversion to biopsy for definitive characterization. 4. No suspicious RIGHT axillary adenopathy.   RECOMMENDATION: 1. RIGHT breast ultrasound-guided biopsy x2 2. RIGHT breast ultrasound-guided aspiration with potential conversion to biopsy x1   01/19/2024 Initial Diagnosis   Malignant neoplasm of upper-outer quadrant of right breast in female, estrogen receptor positive (HCC)   01/21/2024 Cancer Staging   Staging form: Breast, AJCC 8th Edition - Clinical stage from 01/21/2024: Stage IA (cT1c, cN0, cM0, G2, ER+, PR-, HER2-) - Signed by [name], MD on 01/21/2024 Stage prefix: Initial diagnosis Histologic grading system: 3 grade system Laterality: Right Staged by: Pathologist and managing physician Stage used in treatment planning: Yes National guidelines used in treatment planning: Yes Type of national guideline used in treatment planning: NCCN Staging comments: This was the 8cmfn     Pathology Results    1. Breast, right, needle core biopsy, 10 o'clock 8cmfn (heart clip) :       - INVASIVE DUCTAL CARCINOMA WITH PAPILLARY FEATURES, SEE NOTE       - DUCTAL CARCINOMA IN SITU, INTERMEDIATE GRADE       - TUBULE FORMATION: SCORE 3       - NUCLEAR PLEOMORPHISM:  SCORE 2       -  MITOTIC COUNT: SCORE 1       - TOTAL SCORE: 6       - OVERALL GRADE: 2       - LYMPHOVASCULAR INVASION: NOT IDENTIFIED       - CANCER LENGTH: 0.5 CM       - CALCIFICATIONS: NOT IDENTIFIED       - OTHER FINDINGS: NONE   2. Breast, right, needle core biopsy, 10 o'clock 7cmfn (ribbon clip) :       - INVASIVE DUCTAL CARCINOMA WITH MUCINOUS FEATURES, SEE NOTE       - DUCTAL CARCINOMA IN SITU, INTERMEDIATE GRADE       - TUBULE FORMATION: SCORE 3       - NUCLEAR PLEOMORPHISM: SCORE 2       - MITOTIC COUNT: SCORE 1       - TOTAL SCORE: 6       - OVERALL GRADE: 2       - LYMPHOVASCULAR INVASION: NOT IDENTIFIED       - CANCER LENGTH: 0.4 CM       - CALCIFICATIONS: NOT IDENTIFIED       - OTHER FINDINGS: NONE   3. Breast, right, needle core biopsy, 12 o'clock 5cmfn (coil clip) :       - FIBROCYSTIC CHANGE AND APOCRINE METAPLASIA       - NO MALIGNANCY IDENTIFIED   ADDENDUM  1A) Breast, right, needle core biopsy, 10 o'clock 8cmfn (heart clip)  PROGNOSTIC INDICATORS   Results:  IMMUNOHISTOCHEMICAL AND MORPHOMETRIC ANALYSIS PERFORMED MANUALLY  The tumor cells are NEGATIVE for Her2 (0).  Estrogen Receptor:  95%, POSITIVE, STRONG STAINING INTENSITY  Progesterone  Receptor:  0%, NEGATIVE  Proliferation Marker Ki67:  5%    IMMUNOHISTOCHEMICAL AND MORPHOMETRIC ANALYSIS PERFORMED MANUALLY The tumor cells are NEGATIVE for Her2 (0). Estrogen Receptor:  100%, POSITIVE, STRONG STAINING INTENSITY Progesterone  Receptor:  20%, POSITIVE, MODERATE STAINING INTENSITY Proliferation Marker Ki67:  10%     Genetic Testing   Kristine Bowman had genetic testing done due to her personal history of breast cancer. Genetic Testing Ordered: [facility] CancerNext+RNAinsight (40 genes) Report Date: 01/29/2024 Result: NEGATIVE [facility] CancerNext + RNAinsight gene panel which includes sequencing, rearrangement analysis, and RNA analysis for the following 40 genes: APC, ATM, BAP1, BARD1, BMPR1A, BRCA1, BRCA2,  BRIP1, CDH1, CDKN2A, CHEK2, FH, FLCN, MET, MLH1, MSH2, MSH6, MUTYH, NF1, NTHL1, PALB2, PMS2, PTEN, RAD51C, RAD51D, RPS20, SMAD4, STK11, TP53, TSC1, TSC2, and VHL (sequencing and deletion/duplication); AXIN2, HOXB13, MBD4, MSH3, POLD1 and POLE (sequencing only); EPCAM and GREM1 (deletion/duplication only).   Malignant neoplasm of overlapping sites of right breast in female, estrogen receptor positive (HCC)  01/19/2024 Initial Diagnosis   Malignant neoplasm of overlapping sites of right breast in female, estrogen receptor positive (HCC)   01/21/2024 Cancer Staging   Staging form: Breast, AJCC 8th Edition - Clinical stage from 01/21/2024: Stage IA (cT1c, cN0, cM0, G2, ER+, PR+, HER2-) - Signed by [name], MD on 01/21/2024 Stage prefix: Initial diagnosis Nuclear grade: G2 Histologic grading system: 3 grade system Laterality: Right Staged by: Pathologist and managing physician Stage used in treatment planning: Yes National guidelines used in treatment planning: Yes Type of national guideline used in treatment planning: NCCN Staging comments: On 7cmfm      Discussed the use of AI scribe software for clinical note transcription with the patient, who gave verbal consent to proceed.  History of Present Illness  Kristine Bowman is a 69 year old female with breast cancer undergoing radiation therapy  who presents for oncology follow-up to monitor treatment response and address mild radiation dermatitis.  She is nearing completion of radiation therapy for breast cancer, with three sessions remaining and anticipated completion early next week. Last week, she developed mild radiation dermatitis in the treatment area, described as erythema similar to a light sunburn, without significant discomfort or limitation of activity. She has not experienced fatigue or other systemic symptoms during treatment.  She has elected not to pursue adjuvant endocrine therapy and remains off medication.  All other  systems were reviewed with the patient and are negative.  MEDICAL HISTORY:  Past Medical History:  Diagnosis Date   Arthritis 2022   Breast cancer [facility])    Family history of breast cancer    KNEE PAIN 06/26/2010   Qualifier: Diagnosis of  By: Alvan MD, Catherine     Right calcaneal fracture    SHOULDER PAIN, RIGHT 07/09/2010   Qualifier: Diagnosis of  By: Alvan MD, Catherine     Skin cancer 08/2018   Had plastic surgery on side of my nose    SURGICAL HISTORY: Past Surgical History:  Procedure Laterality Date   BREAST BIOPSY Right 01/13/2024   US  RT BREAST BX W LOC DEV 1ST LESION IMG BX SPEC US  GUIDE 01/13/2024 [facility]   BREAST BIOPSY Right 01/13/2024   US  RT BREAST BX W LOC DEV EA ADD LESION IMG BX SPEC US  GUIDE 01/13/2024 [facility]   BREAST BIOPSY Right 01/13/2024   US  RT BREAST BX W LOC DEV EA ADD LESION IMG BX SPEC US  GUIDE 01/13/2024 [facility]   BREAST BIOPSY Right 02/02/2024   US  RT BREAST BX W LOC DEV 1ST LESION IMG BX SPEC US  GUIDE 02/02/2024 [facility]   BREAST BIOPSY  02/24/2024   MM RT RADIOACTIVE SEED EA ADD LESION LOC MAMMO GUIDE 02/24/2024 [facility]   BREAST BIOPSY  02/24/2024   MM RT RADIOACTIVE SEED EA ADD LESION LOC MAMMO GUIDE 02/24/2024 [facility]   BREAST BIOPSY  02/24/2024   MM RT RADIOACTIVE SEED LOC MAMMO GUIDE 02/24/2024 [facility]   BREAST LUMPECTOMY WITH RADIOACTIVE SEED LOCALIZATION Right 02/25/2024   Procedure: BREAST LUMPECTOMY WITH RADIOACTIVE SEED LOCALIZATION;  Surgeon: Aron Shoulders, MD;  Location: [facility] OR;  Service: General;  Laterality: Right;  RIGHT BREAST SEED BRACKETED LUMPECTOMY   COLONOSCOPY     FRACTURE SURGERY  3/22   Broke heal   ORIF CALCANEOUS FRACTURE Right 07/27/2020   Procedure: OPEN REDUCTION INTERNAL FIXATION (ORIF) CALCANEOUS FRACTURE;  Surgeon: Kit Rush, MD;  Location: [facility];  Service: Orthopedics;  Laterality: Right;    RADIOACTIVE SEED  GUIDED BREAST BIOPSY Right 02/25/2024   Procedure: RADIOACTIVE SEED GUIDED BREAST BIOPSY;  Surgeon: Aron Shoulders, MD;  Location: [facility] OR;  Service: General;  Laterality: Right;  RIGHT SEED LOCALIZED EXCISIONAL BIOPSY    SOCIAL HISTORY: Social History   Socioeconomic History   Marital status: Married    Spouse name: Marirose Deveney   Number of children: 2   Years of education: 12   Highest education level: Some college, no degree  Occupational History   Occupation: Horticulturist, Commercial: [employer]   Occupation: HR Consultant    Comment: Full time  Tobacco Use   Smoking status: Never   Smokeless tobacco: Never  Vaping Use   Vaping status: Never Used  Substance and Sexual Activity   Alcohol use: Yes    Alcohol/week: 3.0 standard drinks of alcohol  Types: 3 Standard drinks or equivalent per week    Comment: social   Drug use: No   Sexual activity: Yes    Partners: Male    Birth control/protection: Post-menopausal  Other Topics Concern   Not on file  Social History Narrative   Lives with her spouse. She has two children. She enjoys being outdoors and going to the beach.   Social Drivers of Health   Tobacco Use: Low Risk (05/03/2024)   Patient History    Smoking Tobacco Use: Never    Smokeless Tobacco Use: Never    Passive Exposure: Not on file  Financial Resource Strain: Low Risk (03/10/2024)   Overall Financial Resource Strain (CARDIA)    Difficulty of Paying Living Expenses: Not hard at all  Food Insecurity: No Food Insecurity (03/24/2024)   Epic    Worried About Programme Researcher, Broadcasting/film/video in the Last Year: Never true    Ran Out of Food in the Last Year: Never true  Transportation Needs: No Transportation Needs (03/24/2024)   Epic    Lack of Transportation (Medical): No    Lack of Transportation (Non-Medical): No  Physical Activity: Sufficiently Active (03/10/2024)   Exercise Vital Sign    Days of Exercise per Week: 5 days    Minutes of Exercise per  Session: 40 min  Stress: No Stress Concern Present (03/10/2024)   [employer] of Occupational Health - Occupational Stress Questionnaire    Feeling of Stress: Not at all  Social Connections: Socially Integrated (03/10/2024)   Social Connection and Isolation Panel    Frequency of Communication with Friends and Family: More than three times a week    Frequency of Social Gatherings with Friends and Family: More than three times a week    Attends Religious Services: 1 to 4 times per year    Active Member of Clubs or Organizations: Yes    Attends Banker Meetings: More than 4 times per year    Marital Status: Married  Catering Manager Violence: Not At Risk (03/24/2024)   Epic    Fear of Current or Ex-Partner: No    Emotionally Abused: No    Physically Abused: No    Sexually Abused: No  Depression (PHQ2-9): Low Risk (05/03/2024)   Depression (PHQ2-9)    PHQ-2 Score: 0  Alcohol Screen: Low Risk (03/10/2024)   Alcohol Screen    Last Alcohol Screening Score (AUDIT): 2  Housing: Low Risk (03/24/2024)   Epic    Unable to Pay for Housing in the Last Year: No    Number of Times Moved in the Last Year: 0    Homeless in the Last Year: No  Utilities: Not At Risk (03/24/2024)   Epic    Threatened with loss of utilities: No  Health Literacy: Not on file    FAMILY HISTORY: Family History  Problem Relation Age of Onset   Hypertension Mother 63   Arthritis Mother    Heart disease Mother    Heart disease Father 38       quadruple bypass   Skin cancer Father    Skin cancer Brother        Non-Melanoma   Heart disease Maternal Grandfather    Breast cancer Paternal Grandmother 47   Heart disease Paternal Grandfather    Skin cancer Paternal Uncle        Non-Melanoma   Breast cancer Paternal Cousin 48    ALLERGIES:  is allergic to oxycodone , codeine, hydrocodone, metronidazole , tramadol , betadine [povidone  iodine], latex, meloxicam , and povidone-iodine.  MEDICATIONS:   Current Outpatient Medications  Medication Sig Dispense Refill   ibuprofen  (ADVIL ) 800 MG tablet Take 800 mg by mouth as needed for moderate pain (pain score 4-6).     OVER THE COUNTER MEDICATION Take 1 packet by mouth daily. Shacklee Multivitamin Packets (Patient not taking: Reported on 05/21/2024)     No current facility-administered medications for this visit.     PHYSICAL EXAMINATION: ECOG PERFORMANCE STATUS: 0 - Asymptomatic  Vitals:   05/21/24 0854  BP: 126/84  Pulse: 71  Resp: 16  Temp: 98 F (36.7 C)  SpO2: 100%     Filed Weights   05/21/24 0854  Weight: 192 lb 9.6 oz (87.4 kg)      GENERAL:alert, no distress and comfortable   LABORATORY DATA:  I have reviewed the data as listed Lab Results  Component Value Date   WBC 5.0 02/20/2024   HGB 12.4 02/20/2024   HCT 38.0 02/20/2024   MCV 93.4 02/20/2024   PLT 262 02/20/2024     Chemistry      Component Value Date/Time   NA 138 01/21/2024 1147   NA 140 03/11/2023 0846   K 4.2 01/21/2024 1147   CL 106 01/21/2024 1147   CO2 26 01/21/2024 1147   BUN 25 (H) 01/21/2024 1147   BUN 16 03/11/2023 0846   CREATININE 1.19 (H) 01/21/2024 1147   CREATININE 1.14 (H) 04/17/2021 0000      Component Value Date/Time   CALCIUM 9.2 01/21/2024 1147   ALKPHOS 52 01/21/2024 1147   AST 16 01/21/2024 1147   ALT 19 01/21/2024 1147   BILITOT 0.5 01/21/2024 1147       RADIOGRAPHIC STUDIES: I have personally reviewed the radiological images as listed and agreed with the findings in the report. No results found.   All questions were answered. The patient knows to call the clinic with any problems, questions or concerns. I spent 30 minutes in the care of this patient including H and P, review of records, counseling and coordination of care.     Amber Stalls, MD 05/21/2024 9:25 AM

## 2024-05-24 ENCOUNTER — Ambulatory Visit

## 2024-05-25 ENCOUNTER — Ambulatory Visit

## 2024-05-26 ENCOUNTER — Ambulatory Visit

## 2024-05-27 ENCOUNTER — Ambulatory Visit
Admission: RE | Admit: 2024-05-27 | Discharge: 2024-05-27 | Disposition: A | Discharge status: Home / Self Care | Source: Ambulatory Visit | Attending: Radiation Oncology | Admitting: Radiation Oncology

## 2024-05-27 ENCOUNTER — Other Ambulatory Visit: Payer: Self-pay

## 2024-05-27 LAB — RAD ONC ARIA SESSION SUMMARY
Course Elapsed Days: 31
Plan Fractions Treated to Date: 5
Plan Prescribed Dose Per Fraction: 2 Gy
Plan Total Fractions Prescribed: 6
Plan Total Prescribed Dose: 12 Gy
Reference Point Dosage Given to Date: 10 Gy
Reference Point Session Dosage Given: 2 Gy
Session Number: 20

## 2024-05-28 ENCOUNTER — Ambulatory Visit

## 2024-05-28 ENCOUNTER — Ambulatory Visit
Admission: RE | Admit: 2024-05-28 | Discharge: 2024-05-28 | Disposition: A | Source: Ambulatory Visit | Attending: Radiation Oncology | Admitting: Radiation Oncology

## 2024-05-28 ENCOUNTER — Other Ambulatory Visit: Payer: Self-pay

## 2024-05-28 LAB — RAD ONC ARIA SESSION SUMMARY
Course Elapsed Days: 32
Plan Fractions Treated to Date: 6
Plan Prescribed Dose Per Fraction: 2 Gy
Plan Total Fractions Prescribed: 6
Plan Total Prescribed Dose: 12 Gy
Reference Point Dosage Given to Date: 12 Gy
Reference Point Session Dosage Given: 2 Gy
Session Number: 21

## 2024-05-31 ENCOUNTER — Ambulatory Visit

## 2024-05-31 NOTE — Radiation Completion Notes (Signed)
 Patient Name: Kristine Bowman, Kristine Bowman MRN: [id number] Date of Birth: 07-04-1955 Referring Physician: AMBER STALLS, M.D. Date of Service: 2024-05-31 Radiation Oncologist: Signe Nasuti, M.D. [facility]                             RADIATION ONCOLOGY END OF TREATMENT NOTE     Diagnosis: C50.411 Malignant neoplasm of upper-outer quadrant of right female breast Staging on 2024-01-21: Malignant neoplasm of upper-outer quadrant of right breast in female, estrogen receptor positive (HCC) T=cT1c, N=cN0, M=cM0 Staging on 2024-01-21: Malignant neoplasm of overlapping sites of right breast in female, estrogen receptor positive (HCC) T=cT1c, N=cN0, M=cM0 Intent: Curative     ==========DELIVERED PLANS==========  First Treatment Date: 2024-04-26 Last Treatment Date: 2024-05-28   Plan Name: Breast_R Site: Breast, Right Technique: 3D Mode: Photon Dose Per Fraction: 2.67 Gy Prescribed Dose (Delivered / Prescribed): 40.05 Gy / 40.05 Gy Prescribed Fxs (Delivered / Prescribed): 15 / 15   Plan Name: Breast_R_Bst Site: Breast, Right Technique: 3D Mode: Photon Dose Per Fraction: 2 Gy Prescribed Dose (Delivered / Prescribed): 12 Gy / 12 Gy Prescribed Fxs (Delivered / Prescribed): 6 / 6     ==========ON TREATMENT VISIT DATES========== 2024-04-27, 2024-05-04, 2024-05-11, 2024-05-18, 2024-05-27     ==========UPCOMING VISITS========== 08/13/2024 [facility] SURVIVORSHIP CARE PLAN VISIT Crawford Morna Pickle, NP  08/13/2024 [facility] NURSE EVAL [facility] NURSE VISIT  06/22/2024 [facility] ONC FOLLOW UP 15 Wyatt Czar M, [place]        ==========APPENDIX - ON TREATMENT VISIT NOTES==========   See weekly On Treatment Notes in Epic for details in the Media tab (listed as Progress notes on the On Treatment Visit Dates listed above).

## 2024-06-22 ENCOUNTER — Ambulatory Visit: Admitting: Radiology

## 2024-08-13 ENCOUNTER — Inpatient Hospital Stay: Admitting: Adult Health

## 2024-08-13 ENCOUNTER — Inpatient Hospital Stay: Attending: Hematology and Oncology

## 2025-05-20 ENCOUNTER — Inpatient Hospital Stay: Admitting: Hematology and Oncology
